# Patient Record
Sex: Female | Born: 1990 | Race: White | Hispanic: No | Marital: Married | Smoking: Former smoker
Health system: Southern US, Community
[De-identification: ages and names within clinical notes are randomized; demographics above are authoritative.]

## PROBLEM LIST (undated history)

## (undated) ENCOUNTER — Inpatient Hospital Stay: Payer: Self-pay

## (undated) ENCOUNTER — Emergency Department (HOSPITAL_COMMUNITY): Payer: Medicaid Other

## (undated) DIAGNOSIS — R51 Headache: Secondary | ICD-10-CM

## (undated) DIAGNOSIS — K219 Gastro-esophageal reflux disease without esophagitis: Secondary | ICD-10-CM

## (undated) DIAGNOSIS — K8050 Calculus of bile duct without cholangitis or cholecystitis without obstruction: Secondary | ICD-10-CM

## (undated) DIAGNOSIS — F329 Major depressive disorder, single episode, unspecified: Secondary | ICD-10-CM

## (undated) DIAGNOSIS — Z8751 Personal history of pre-term labor: Secondary | ICD-10-CM

## (undated) DIAGNOSIS — J45909 Unspecified asthma, uncomplicated: Secondary | ICD-10-CM

## (undated) DIAGNOSIS — F32A Depression, unspecified: Secondary | ICD-10-CM

## (undated) DIAGNOSIS — R519 Headache, unspecified: Secondary | ICD-10-CM

## (undated) DIAGNOSIS — F419 Anxiety disorder, unspecified: Secondary | ICD-10-CM

## (undated) HISTORY — DX: Calculus of bile duct without cholangitis or cholecystitis without obstruction: K80.50

## (undated) HISTORY — PX: NO PAST SURGERIES: SHX2092

## (undated) HISTORY — DX: Personal history of pre-term labor: Z87.51

---
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
## 2011-04-08 ENCOUNTER — Ambulatory Visit: Payer: Self-pay | Admitting: Family

## 2011-11-28 ENCOUNTER — Observation Stay: Payer: Self-pay

## 2011-12-01 ENCOUNTER — Inpatient Hospital Stay: Payer: Self-pay

## 2011-12-01 LAB — CBC WITH DIFFERENTIAL/PLATELET
Basophil #: 0 10*3/uL (ref 0.0–0.1)
Eosinophil #: 0 10*3/uL (ref 0.0–0.7)
Eosinophil %: 0.2 %
Lymphocyte #: 1.8 10*3/uL (ref 1.0–3.6)
MCH: 27.1 pg (ref 26.0–34.0)
MCHC: 33.1 g/dL (ref 32.0–36.0)
MCV: 82 fL (ref 80–100)
Neutrophil #: 5.9 10*3/uL (ref 1.4–6.5)
Platelet: 202 10*3/uL (ref 150–440)
RBC: 3.77 10*6/uL — ABNORMAL LOW (ref 3.80–5.20)
RDW: 15 % — ABNORMAL HIGH (ref 11.5–14.5)

## 2011-12-02 LAB — HEMATOCRIT: HCT: 29 % — ABNORMAL LOW (ref 35.0–47.0)

## 2013-08-24 ENCOUNTER — Observation Stay: Payer: Self-pay | Admitting: Obstetrics and Gynecology

## 2013-08-26 ENCOUNTER — Inpatient Hospital Stay: Payer: Self-pay | Admitting: Obstetrics and Gynecology

## 2013-08-26 LAB — CBC WITH DIFFERENTIAL/PLATELET
Basophil #: 0 10*3/uL (ref 0.0–0.1)
Basophil %: 0.3 %
Eosinophil #: 0 10*3/uL (ref 0.0–0.7)
HCT: 34 % — ABNORMAL LOW (ref 35.0–47.0)
Lymphocyte #: 1.7 10*3/uL (ref 1.0–3.6)
Lymphocyte %: 17.9 %
MCH: 27.7 pg (ref 26.0–34.0)
MCHC: 34.4 g/dL (ref 32.0–36.0)
MCV: 80 fL (ref 80–100)
Monocyte #: 0.6 x10 3/mm (ref 0.2–0.9)
Monocyte %: 6.7 %
Neutrophil %: 74.8 %
Platelet: 206 10*3/uL (ref 150–440)
RDW: 14.7 % — ABNORMAL HIGH (ref 11.5–14.5)

## 2013-08-26 LAB — GC/CHLAMYDIA PROBE AMP

## 2013-08-27 LAB — HEMATOCRIT: HCT: 27 % — ABNORMAL LOW (ref 35.0–47.0)

## 2014-07-27 ENCOUNTER — Emergency Department: Payer: Self-pay | Admitting: Emergency Medicine

## 2014-07-27 LAB — CBC
HCT: 35.3 % (ref 35.0–47.0)
HGB: 11.4 g/dL — ABNORMAL LOW (ref 12.0–16.0)
MCH: 27.4 pg (ref 26.0–34.0)
MCHC: 32.3 g/dL (ref 32.0–36.0)
MCV: 85 fL (ref 80–100)
PLATELETS: 273 10*3/uL (ref 150–440)
RBC: 4.15 10*6/uL (ref 3.80–5.20)
RDW: 13.9 % (ref 11.5–14.5)
WBC: 7.8 10*3/uL (ref 3.6–11.0)

## 2014-07-27 LAB — HCG, QUANTITATIVE, PREGNANCY: Beta Hcg, Quant.: 7876 m[IU]/mL — ABNORMAL HIGH

## 2014-10-14 NOTE — L&D Delivery Note (Signed)
Delivery Note At 11:41 AM a viable female was delivered via Vaginal, Spontaneous Delivery (Presentation LOA ;  ).  APGAR:8/9 , ; weight  .   Placenta status: Intact, Spontaneous.  Cord:3v  with the following complications: delayed clamping of cord. Loose nuchal cord reduced after delivery of the head. Anesthesia:   Episiotomy: None Lacerations:  none Suture Repair: n/a Est. Blood Loss (mL):  100  Mom to postpartum.  Baby to Couplet care / Skin to Skin.  Shavaughn Seidl 07/18/2015, 11:50 AM

## 2014-10-15 LAB — HM PAP SMEAR: HM Pap smear: NORMAL

## 2014-12-20 ENCOUNTER — Ambulatory Visit: Payer: Self-pay | Admitting: Family Medicine

## 2015-02-21 NOTE — H&P (Signed)
L&D Evaluation:  History:  HPI Pt is a 24 yo G4P2103 at 40.6 weeks with an ECD of 08/20/13. She presents today with reports of getting out of bed and having a gush of clear fluid come out around 7:15am. She has also been having some contractions after her water broke. She reports +FM. Her prenatal care is complicate by late entry to care and asthma. She is AB+, VI, RI, GBS+   Presents with leaking fluid   Patient's Medical History Asthma   Patient's Surgical History none   Medications Pre Natal Vitamins   Allergies NKDA   Social History none   Family History Non-Contributory   ROS:  ROS All systems were reviewed.  HEENT, CNS, GI, GU, Respiratory, CV, Renal and Musculoskeletal systems were found to be normal.   Exam:  Vital Signs stable   General no apparent distress   Mental Status clear   Chest clear   Heart normal sinus rhythm   Abdomen gravid, tender with contractions   Back no CVAT   Edema no edema   Pelvic other, 3/80/-2   Mebranes Ruptured, at 715am- clear   Description clear   FHT normal rate with no decels, 140's +accels   Ucx irregular   Skin dry, no lesions, no rashes   Lymph no lymphadenopathy   Impression:  Impression IUP at 40.6, labor, membranes ruptured   Plan:  Plan EFM/NST, monitor contractions and for cervical change, antibiotics for GBBS prophylaxis   Follow Up Appointment need to schedule   Electronic Signatures: Jannet MantisSubudhi, Yeslin Delio (CNM)  (Signed [id number]-Nov-14 11:28)  Authored: L&D Evaluation   Last Updated: 13-Nov-14 11:28 by Jannet MantisSubudhi, Teran Knittle (CNM)

## 2015-02-24 ENCOUNTER — Other Ambulatory Visit: Payer: Self-pay | Admitting: Family Medicine

## 2015-02-27 ENCOUNTER — Other Ambulatory Visit: Payer: Self-pay | Admitting: Family Medicine

## 2015-02-27 DIAGNOSIS — Z3490 Encounter for supervision of normal pregnancy, unspecified, unspecified trimester: Secondary | ICD-10-CM

## 2015-03-01 ENCOUNTER — Ambulatory Visit: Payer: Self-pay

## 2015-03-02 ENCOUNTER — Ambulatory Visit
Admission: RE | Admit: 2015-03-02 | Discharge: 2015-03-02 | Disposition: A | Discharge status: Home / Self Care | Payer: Medicaid Other | Source: Ambulatory Visit | Attending: Obstetrics and Gynecology | Admitting: Obstetrics and Gynecology

## 2015-03-02 VITALS — BP 127/74 | HR 106 | Temp 98.3°F | Ht 62.0 in | Wt 166.6 lb

## 2015-03-02 DIAGNOSIS — O09212 Supervision of pregnancy with history of pre-term labor, second trimester: Secondary | ICD-10-CM | POA: Diagnosis not present

## 2015-03-02 DIAGNOSIS — O09892 Supervision of other high risk pregnancies, second trimester: Secondary | ICD-10-CM

## 2015-03-02 HISTORY — DX: Major depressive disorder, single episode, unspecified: F32.9

## 2015-03-02 HISTORY — DX: Anxiety disorder, unspecified: F41.9

## 2015-03-02 HISTORY — DX: Depression, unspecified: F32.A

## 2015-03-02 NOTE — Progress Notes (Signed)
[facility] Consultation   Chief Complaint: History of 35 week delivery.  Is 17P indicated?  HPI: Ms. Hassell DoneKarly C Williams Shatz is a 24 y.o. [id number] at 5843w3d by 10 wk ultrasound who [facility]  for evaluation for 17P.  Past Medical History: Patient  has a past medical history of Depression and Anxiety. Not currently on medication.  Per records, asthma but not currently taking medication. Past Surgical History: She  has no past surgical history on file. Her 2 TABs were medical TABs. Obstetric History:  OB History    Gravida Para Term Preterm AB TAB SAB Ectopic Multiple Living   8 4 3 1 3 2 1   4      Gynecologic History:  No LMP recorded. Patient is pregnant.  Menses are irregular Hx of abnormal pap smears: no Last pap smear patient does not recall when last pap was   Medications: Tylenol PRN Allergies: Patient has No Known Allergies.  Social History: Patient  reports that she has quit smoking. She does not have any smokeless tobacco history on file. She reports that she does not drink alcohol or use illicit drugs. She is currently unemployed.  Her husband works as an Personnel officerelectrician and she presents today with her mother-in-law. Family History: unremarkable per patient Review of Systems A full 12 point review of systems was negative or as noted in the History of Present Illness.  Physical Exam: BP 127/74 mmHg  Pulse 106  Temp(Src) 98.3 F (36.8 C)  Ht 5\' 2"  (1.575 m)  Wt 166 lb 9.6 oz (75.569 kg)  BMI 30.46 kg/m2  SpO2 100%   Asessement: 24yo [id number] at 7143w3d by 10wk ultrasound.  History of 35 week delivery followed by 3 41 week deliveries (2 of which required induction of labor).    Plan: Ms. Darden AmberWilliams Creger feels like her first labor may have been precipitated by a UTI.  Her daughter did not require a stay in the hospital but was monitored for jaundice.  Her subsequent pregnancies were post dates.  We discussed  the recommendations and indication for 17P, which include any prior spontaneous preterm delivery, although I don't feel strongly that it is indicated in this situation.    I am more concerned with her mood and anxiety/depression.  She reports stopping her meds with the knowledge of her pregnancy although she doesn't know the name of them.  She denies SI/HI but does report moodiness which her mother-in-law endorses.  She was encouraged to reconsider mental health counseling.  Ms. Mayford KnifeWilliams will be return for an ultrasound for fetal anatomy on Monday.  Lastly, she reports conceiving her 2 most recent pregnancies on OCPs - we briefly discussed LARC methods of birth control which are likely to be more effective.  Total time spent with the patient was 30 minutes with greater than 50% spent in counseling and coordination of care. We appreciate this interesting consult and will be happy to be involved in the ongoing care of Ms. Darden AmberWilliams Eckmann in anyway her obstetricians desire.  Kirby FunkSarah Chanz Cahall, MD Maternal-Fetal Medicine [facility]

## 2015-03-03 ENCOUNTER — Ambulatory Visit: Payer: Self-pay

## 2015-03-06 ENCOUNTER — Ambulatory Visit
Admission: RE | Admit: 2015-03-06 | Discharge: 2015-03-06 | Disposition: A | Discharge status: Home / Self Care | Payer: Medicaid Other | Source: Ambulatory Visit | Attending: Maternal & Fetal Medicine | Admitting: Maternal & Fetal Medicine

## 2015-03-06 DIAGNOSIS — O35EXX0 Maternal care for other (suspected) fetal abnormality and damage, fetal genitourinary anomalies, not applicable or unspecified: Secondary | ICD-10-CM

## 2015-03-06 DIAGNOSIS — O359XX0 Maternal care for (suspected) fetal abnormality and damage, unspecified, not applicable or unspecified: Secondary | ICD-10-CM

## 2015-03-06 DIAGNOSIS — O358XX0 Maternal care for other (suspected) fetal abnormality and damage, not applicable or unspecified: Secondary | ICD-10-CM | POA: Diagnosis not present

## 2015-03-06 DIAGNOSIS — Z3A21 21 weeks gestation of pregnancy: Secondary | ICD-10-CM | POA: Diagnosis not present

## 2015-03-06 LAB — US OB DETAIL + 14 WK

## 2015-03-23 LAB — OB RESULTS CONSOLE GBS: GBS: NEGATIVE

## 2015-03-23 LAB — OB RESULTS CONSOLE VARICELLA ZOSTER ANTIBODY, IGG: VARICELLA IGG: IMMUNE

## 2015-03-23 LAB — OB RESULTS CONSOLE RUBELLA ANTIBODY, IGM: Rubella: IMMUNE

## 2015-03-23 LAB — OB RESULTS CONSOLE ABO/RH: RH Type: POSITIVE

## 2015-03-23 LAB — OB RESULTS CONSOLE HEPATITIS B SURFACE ANTIGEN: HEP B S AG: NEGATIVE

## 2015-03-23 LAB — OB RESULTS CONSOLE GC/CHLAMYDIA
Chlamydia: NEGATIVE
Gonorrhea: NEGATIVE

## 2015-03-23 LAB — OB RESULTS CONSOLE ANTIBODY SCREEN: Antibody Screen: NEGATIVE

## 2015-03-23 LAB — OB RESULTS CONSOLE RPR: RPR: NONREACTIVE

## 2015-03-23 LAB — OB RESULTS CONSOLE HIV ANTIBODY (ROUTINE TESTING): HIV: NONREACTIVE

## 2015-05-29 ENCOUNTER — Ambulatory Visit
Admission: RE | Admit: 2015-05-29 | Discharge: 2015-05-29 | Disposition: A | Discharge status: Home / Self Care | Payer: Medicaid Other | Source: Ambulatory Visit | Attending: Maternal & Fetal Medicine | Admitting: Maternal & Fetal Medicine

## 2015-05-29 DIAGNOSIS — O359XX0 Maternal care for (suspected) fetal abnormality and damage, unspecified, not applicable or unspecified: Secondary | ICD-10-CM | POA: Diagnosis not present

## 2015-05-29 DIAGNOSIS — Z3A33 33 weeks gestation of pregnancy: Secondary | ICD-10-CM | POA: Diagnosis not present

## 2015-05-29 DIAGNOSIS — N2889 Other specified disorders of kidney and ureter: Secondary | ICD-10-CM

## 2015-05-29 LAB — US MFM OB FOLLOW UP

## 2015-06-05 ENCOUNTER — Telehealth: Payer: Self-pay | Admitting: Obstetrics and Gynecology

## 2015-07-08 ENCOUNTER — Observation Stay
Admission: EM | Admit: 2015-07-08 | Discharge: 2015-07-09 | Disposition: A | Discharge status: Home / Self Care | Payer: Medicaid Other | Attending: Obstetrics and Gynecology | Admitting: Obstetrics and Gynecology

## 2015-07-08 DIAGNOSIS — Z3A38 38 weeks gestation of pregnancy: Secondary | ICD-10-CM | POA: Diagnosis not present

## 2015-07-08 DIAGNOSIS — O479 False labor, unspecified: Secondary | ICD-10-CM | POA: Diagnosis present

## 2015-07-08 NOTE — OB Triage Note (Signed)
Pt states she is having irregular contractions, some in her back, some in the front, no bleeding, no leaking fluid

## 2015-07-09 NOTE — OB Triage Provider Note (Signed)
History     CSN: [id number]  Arrival date and time: 07/08/15 2232   None    HPI  Lacey Bentley is a 24 year old Caucasian female [id number] at 38+[redacted]weeks gestation presenting to labor and delivery triage for a labor check. Reports she has been feeling "contractions and tightening at home that start in her back and move to her front."  She reports good fetal movement.  Denies vaginal bleeding, LOF, abnormal discharge, dysuria.  Reports good prenatal care at [facility] and has been followed by [facility] in [place] for fetal pyelectasis. Pt. Has 4 girls at home and this is a boy. She reports having her first baby at 35 weeks, but for the rest of her children she had to be induced.   OB History    Gravida Para Term Preterm AB TAB SAB Ectopic Multiple Living   Past Medical History  Diagnosis Date  . Depression   . Anxiety     Past Surgical History  Procedure Laterality Date  . No past surgeries      No family history on file.  Social History  Substance Use Topics  . Smoking status: Former Games developer  . Smokeless tobacco: Never Used  . Alcohol Use: No    Allergies: No Known Allergies  No prescriptions prior to admission    Review of Systems  Constitutional: Negative for fever, chills and weight loss.  HENT: Negative for congestion.   Eyes: Negative for blurred vision, double vision and photophobia.  Respiratory: Negative for cough, shortness of breath and wheezing.   Cardiovascular: Positive for leg swelling. Negative for chest pain and palpitations.       Non-pitting edema  Gastrointestinal: Negative for heartburn, nausea, vomiting and abdominal pain.       +intermittent mild contractions  Genitourinary: Negative for dysuria, urgency and frequency.  Musculoskeletal: Positive for back pain. Negative for neck pain.       Intermittent low back pain with contractions - but tolerable  Skin: Negative for itching and rash.   Neurological: Negative for dizziness, seizures and headaches.  Endo/Heme/Allergies: Negative for environmental allergies. Does not bruise/bleed easily.  Psychiatric/Behavioral: Negative for depression and suicidal ideas. The patient is not nervous/anxious.    Physical Exam   Blood pressure 120/77, pulse 80, temperature 98.3 F (36.8 C), last menstrual period 10/06/2014.  Physical Exam  Nursing note and vitals reviewed. Constitutional: She is oriented to person, place, and time. She appears well-developed and well-nourished.  HENT:  Head: Normocephalic.  Eyes: Pupils are equal, round, and reactive to light.  Neck: Normal range of motion.  Cardiovascular: Normal rate, regular rhythm and normal heart sounds.   Respiratory: Effort normal and breath sounds normal. No respiratory distress. She has no wheezes. She exhibits no tenderness.  GI: Soft. Bowel sounds are normal.  Genitourinary: Uterus normal.  Gravid; irregular mild contractions palpated  Musculoskeletal: Normal range of motion.  Neurological: She is alert and oriented to person, place, and time. She has normal reflexes.  Skin: Skin is warm and dry.  Dilation: 1.5 Effacement (%): 50 Station: Ballotable Presentation: Vertex Exam by:: s greene,rn  Cervical exam unchanged in 3 hours  Fetal Assessment: Baseline: 135bmp /moderate variability /+15x15 accels / no decels TOCO: contractions from 3-5minutes /sometimes closer intervals / lasting 40-60sec / mild   MAU Course  Procedures  NST  Assessment and Plan  IUP at 38+[redacted]weeks gestation  R/O Labor  Early Latent Labor Category 1 Fetal Tracing  Reviewed options risk/benefits with patient:  1. Walking the halls and staying in the hospital for continued evaluation to see if labor progresses and re-evaluating in the morning 2. Going home and resting, tub soaks and see if labor progresses  Pt. And FOB elect to go home with strict labor precautions - informed me that they only  live 10 minutes from the hospital and will come back if any changes Labor Precautions Reviewed Discharge to Home Return if worsening s/s, vaginal bleeding, LOF, decreased fetal movement, strong contractions 5-10 min apart FKC's daily If labor doesn't progress, keep regularly scheduled appointment at [facility] this week Neonatology/Peds should be involved at time of delivery for fetal pyelectasis   Dr. Dalbert Garnet aware and agrees with plan of care   Karena Addison CNM 07/09/2015, 1:53 AM

## 2015-07-09 NOTE — Plan of Care (Signed)
Discussed plan of care with pt. Would rather go home and get some rest instead of walking here x 1 hour. Meredith,cnm notified of pt's decision. Will be out to see her shortly

## 2015-07-09 NOTE — OB Triage Note (Signed)
Meredith ,cnm in to talk with pt.

## 2015-07-13 NOTE — Telephone Encounter (Signed)
Pt informed that per Dr. Lady Deutscher, her prenatal care should continue at [facility]. She was seen at [facility] by urology and follow up on the fetal kidneys is recommended after delivery.   Cherly Anderson, MS, CGC

## 2015-07-15 ENCOUNTER — Inpatient Hospital Stay
Admission: EM | Admit: 2015-07-15 | Discharge: 2015-07-15 | Disposition: A | Discharge status: Home / Self Care | Payer: Medicaid Other | Attending: Obstetrics and Gynecology | Admitting: Obstetrics and Gynecology

## 2015-07-15 DIAGNOSIS — Z3A40 40 weeks gestation of pregnancy: Secondary | ICD-10-CM | POA: Insufficient documentation

## 2015-07-15 NOTE — Progress Notes (Signed)
Pt given d/c inst and verbalized understanding.   Pt was then d/c home in stable condition with FOB

## 2015-07-15 NOTE — Progress Notes (Signed)
Report given to Dr. Beasley.   

## 2015-07-15 NOTE — OB Triage Provider Note (Signed)
TRIAGE VISIT with NST   Lacey Bentley is a 24 y.o. [id number]. She is at [redacted]w[redacted]d gestation, presenting with signs of labor.  Indication: Contractions  S: Resting comfortably. Irregular CTX, no VB. Active fetal movement.   O:  BP 120/78 mmHg  Pulse 92  Temp(Src) 98.4 F (36.9 C) (Oral)  Resp 18  Ht  (1.575 m)  Wt 79.833 kg (176 lb)  BMI 32.18 kg/m2  LMP 10/06/2014 No results found for this or any previous visit (from the past 48 hour(s)).   Gen: NAD, AAOx3      Abd: FNTTP      Ext: Non-tender, Nonedmeatous    FHT: 130, mod var, +accels, no decels TOCO: quiet JXB:JYNWGNFA: 1.5 Effacement (%): 50 Station: -3 Exam by:: CGD  NST: Reactive. See FHT above for particulars.  A/P:  24 y.o. O1H0865 [redacted]w[redacted]d with irregular contractions.   Labor: not present.   Fetal Wellbeing: Reassuring Cat 1 tracing.  D/c home stable, precautions reviewed, follow-up as scheduled.

## 2015-07-15 NOTE — OB Triage Note (Signed)
C/O Contractions since 1700pm.    Denies ROM, +FM   No VB.   Sexual inter. Yesterday.

## 2015-07-18 ENCOUNTER — Other Ambulatory Visit: Payer: Self-pay | Admitting: Obstetrics and Gynecology

## 2015-07-18 ENCOUNTER — Inpatient Hospital Stay
Admission: EM | Admit: 2015-07-18 | Discharge: 2015-07-19 | DRG: 775 | Disposition: A | Discharge status: Home / Self Care | Payer: Medicaid Other | Attending: Obstetrics and Gynecology | Admitting: Obstetrics and Gynecology

## 2015-07-18 ENCOUNTER — Inpatient Hospital Stay: Payer: Medicaid Other | Admitting: Anesthesiology

## 2015-07-18 DIAGNOSIS — Z87891 Personal history of nicotine dependence: Secondary | ICD-10-CM

## 2015-07-18 DIAGNOSIS — O4290 Premature rupture of membranes, unspecified as to length of time between rupture and onset of labor, unspecified weeks of gestation: Principal | ICD-10-CM | POA: Diagnosis present

## 2015-07-18 LAB — CBC
HCT: 31.4 % — ABNORMAL LOW (ref 35.0–47.0)
HEMOGLOBIN: 10.5 g/dL — AB (ref 12.0–16.0)
MCH: 26 pg (ref 26.0–34.0)
MCHC: 33.5 g/dL (ref 32.0–36.0)
MCV: 77.7 fL — ABNORMAL LOW (ref 80.0–100.0)
PLATELETS: 184 10*3/uL (ref 150–440)
RBC: 4.04 MIL/uL (ref 3.80–5.20)
RDW: 16.1 % — AB (ref 11.5–14.5)
WBC: 8.2 10*3/uL (ref 3.6–11.0)

## 2015-07-18 LAB — TYPE AND SCREEN
ABO/RH(D): AB POS
ANTIBODY SCREEN: NEGATIVE

## 2015-07-18 MED ORDER — NALOXONE HCL 1 MG/ML IJ SOLN
1.0000 ug/kg/h | INTRAVENOUS | Status: DC | PRN
  Filled 2015-07-18: qty 2

## 2015-07-18 MED ORDER — LIDOCAINE-EPINEPHRINE (PF) 1.5 %-1:200000 IJ SOLN
INTRAMUSCULAR | Status: DC | PRN
  Administered 2015-07-18: 3 mL via EPIDURAL

## 2015-07-18 MED ORDER — ONDANSETRON HCL 4 MG/2ML IJ SOLN: 4.0000 mg | Freq: Three times a day (TID) | INTRAMUSCULAR | Status: DC | PRN

## 2015-07-18 MED ORDER — DIPHENHYDRAMINE HCL 25 MG PO CAPS: 25.0000 mg | ORAL_CAPSULE | ORAL | Status: DC | PRN

## 2015-07-18 MED ORDER — ONDANSETRON HCL 4 MG/2ML IJ SOLN: 4.0000 mg | Freq: Four times a day (QID) | INTRAMUSCULAR | Status: DC | PRN

## 2015-07-18 MED ORDER — DIPHENHYDRAMINE HCL 50 MG/ML IJ SOLN: 12.5000 mg | INTRAMUSCULAR | Status: DC | PRN

## 2015-07-18 MED ORDER — WITCH HAZEL-GLYCERIN EX PADS: 1.0000 "application " | MEDICATED_PAD | CUTANEOUS | Status: DC | PRN

## 2015-07-18 MED ORDER — OXYCODONE-ACETAMINOPHEN 5-325 MG PO TABS: 2.0000 | ORAL_TABLET | ORAL | Status: DC | PRN

## 2015-07-18 MED ORDER — OXYTOCIN BOLUS FROM INFUSION
500.0000 mL | INTRAVENOUS | Status: DC
  Administered 2015-07-18: 500 mL via INTRAVENOUS

## 2015-07-18 MED ORDER — PRENATAL MULTIVITAMIN CH
1.0000 | ORAL_TABLET | Freq: Every day | ORAL | Status: DC
  Administered 2015-07-19: 1 via ORAL
  Filled 2015-07-18 (×2): qty 1

## 2015-07-18 MED ORDER — NALBUPHINE HCL 10 MG/ML IJ SOLN
5.0000 mg | INTRAMUSCULAR | Status: DC | PRN
  Filled 2015-07-18: qty 0.5

## 2015-07-18 MED ORDER — DIBUCAINE 1 % RE OINT: 1.0000 "application " | TOPICAL_OINTMENT | RECTAL | Status: DC | PRN

## 2015-07-18 MED ORDER — ACETAMINOPHEN 325 MG PO TABS
650.0000 mg | ORAL_TABLET | ORAL | Status: DC | PRN
  Administered 2015-07-19: 650 mg via ORAL
  Filled 2015-07-18: qty 2

## 2015-07-18 MED ORDER — ONDANSETRON HCL 4 MG PO TABS: 4.0000 mg | ORAL_TABLET | ORAL | Status: DC | PRN

## 2015-07-18 MED ORDER — ONDANSETRON HCL 4 MG/2ML IJ SOLN: 4.0000 mg | INTRAMUSCULAR | Status: DC | PRN

## 2015-07-18 MED ORDER — MISOPROSTOL 200 MCG PO TABS
ORAL_TABLET | ORAL | Status: AC
  Filled 2015-07-18: qty 4

## 2015-07-18 MED ORDER — ACETAMINOPHEN 325 MG PO TABS
650.0000 mg | ORAL_TABLET | ORAL | Status: DC | PRN
  Administered 2015-07-18: 650 mg via ORAL
  Filled 2015-07-18: qty 2

## 2015-07-18 MED ORDER — OXYTOCIN 40 UNITS IN LACTATED RINGERS INFUSION - SIMPLE MED
62.5000 mL/h | INTRAVENOUS | Status: DC
  Filled 2015-07-18: qty 1000

## 2015-07-18 MED ORDER — IBUPROFEN 600 MG PO TABS
600.0000 mg | ORAL_TABLET | Freq: Four times a day (QID) | ORAL | Status: DC
  Administered 2015-07-18 – 2015-07-19 (×4): 600 mg via ORAL
  Filled 2015-07-18 (×4): qty 1

## 2015-07-18 MED ORDER — LANOLIN HYDROUS EX OINT: TOPICAL_OINTMENT | CUTANEOUS | Status: DC | PRN

## 2015-07-18 MED ORDER — BENZOCAINE-MENTHOL 20-0.5 % EX AERO: 1.0000 "application " | INHALATION_SPRAY | CUTANEOUS | Status: DC | PRN

## 2015-07-18 MED ORDER — BUPIVACAINE HCL (PF) 0.25 % IJ SOLN
INTRAMUSCULAR | Status: DC | PRN
  Administered 2015-07-18 (×2): 4 mL via EPIDURAL

## 2015-07-18 MED ORDER — SENNOSIDES-DOCUSATE SODIUM 8.6-50 MG PO TABS: 2.0000 | ORAL_TABLET | ORAL | Status: DC

## 2015-07-18 MED ORDER — OXYTOCIN 40 UNITS IN LACTATED RINGERS INFUSION - SIMPLE MED: 62.5000 mL/h | INTRAVENOUS | Status: DC | PRN

## 2015-07-18 MED ORDER — ZOLPIDEM TARTRATE 5 MG PO TABS: 5.0000 mg | ORAL_TABLET | Freq: Every evening | ORAL | Status: DC | PRN

## 2015-07-18 MED ORDER — FERROUS SULFATE 325 (65 FE) MG PO TABS
325.0000 mg | ORAL_TABLET | Freq: Two times a day (BID) | ORAL | Status: DC
  Administered 2015-07-18 – 2015-07-19 (×2): 325 mg via ORAL
  Filled 2015-07-18 (×2): qty 1

## 2015-07-18 MED ORDER — BUTORPHANOL TARTRATE 1 MG/ML IJ SOLN
2.0000 mg | INTRAMUSCULAR | Status: DC | PRN
  Administered 2015-07-18: 2 mg via INTRAVENOUS
  Filled 2015-07-18: qty 2

## 2015-07-18 MED ORDER — MEASLES, MUMPS & RUBELLA VAC ~~LOC~~ INJ
0.5000 mL | INJECTION | Freq: Once | SUBCUTANEOUS | Status: DC
Start: 2015-07-19 — End: 2015-07-19
  Filled 2015-07-18: qty 0.5

## 2015-07-18 MED ORDER — LIDOCAINE HCL (PF) 1 % IJ SOLN
30.0000 mL | INTRAMUSCULAR | Status: DC | PRN
  Filled 2015-07-18: qty 30

## 2015-07-18 MED ORDER — CITRIC ACID-SODIUM CITRATE 334-500 MG/5ML PO SOLN: 30.0000 mL | ORAL | Status: DC | PRN

## 2015-07-18 MED ORDER — OXYCODONE-ACETAMINOPHEN 5-325 MG PO TABS: 1.0000 | ORAL_TABLET | ORAL | Status: DC | PRN

## 2015-07-18 MED ORDER — SCOPOLAMINE 1 MG/3DAYS TD PT72: 1.0000 | MEDICATED_PATCH | Freq: Once | TRANSDERMAL | Status: DC

## 2015-07-18 MED ORDER — MAGNESIUM HYDROXIDE 400 MG/5ML PO SUSP: 30.0000 mL | ORAL | Status: DC | PRN

## 2015-07-18 MED ORDER — SODIUM CHLORIDE 0.9 % IJ SOLN: 3.0000 mL | INTRAMUSCULAR | Status: DC | PRN

## 2015-07-18 MED ORDER — FLEET ENEMA 7-19 GM/118ML RE ENEM: 1.0000 | ENEMA | RECTAL | Status: DC | PRN

## 2015-07-18 MED ORDER — NALOXONE HCL 0.4 MG/ML IJ SOLN
0.4000 mg | INTRAMUSCULAR | Status: DC | PRN
Start: 2015-07-18 — End: 2015-07-19

## 2015-07-18 MED ORDER — LACTATED RINGERS IV SOLN
500.0000 mL | INTRAVENOUS | Status: DC | PRN
  Administered 2015-07-18: 500 mL via INTRAVENOUS

## 2015-07-18 MED ORDER — FENTANYL 2.5 MCG/ML W/ROPIVACAINE 0.2% IN NS 100 ML EPIDURAL INFUSION (ARMC-ANES): 10.0000 mL/h | EPIDURAL | Status: DC

## 2015-07-18 MED ORDER — NALBUPHINE HCL 10 MG/ML IJ SOLN
5.0000 mg | Freq: Once | INTRAMUSCULAR | Status: DC | PRN
  Filled 2015-07-18: qty 0.5

## 2015-07-18 MED ORDER — LACTATED RINGERS IV SOLN
INTRAVENOUS | Status: DC
  Administered 2015-07-18 (×2): via INTRAVENOUS

## 2015-07-18 MED ORDER — DIPHENHYDRAMINE HCL 25 MG PO CAPS: 25.0000 mg | ORAL_CAPSULE | Freq: Four times a day (QID) | ORAL | Status: DC | PRN

## 2015-07-18 MED ORDER — SIMETHICONE 80 MG PO CHEW: 80.0000 mg | CHEWABLE_TABLET | ORAL | Status: DC | PRN

## 2015-07-18 MED ORDER — MEPERIDINE HCL 25 MG/ML IJ SOLN: 6.2500 mg | INTRAMUSCULAR | Status: DC | PRN

## 2015-07-18 MED ORDER — FENTANYL 2.5 MCG/ML W/ROPIVACAINE 0.2% IN NS 100 ML EPIDURAL INFUSION (ARMC-ANES)
EPIDURAL | Status: AC
  Administered 2015-07-18: 10 mL/h via EPIDURAL
  Filled 2015-07-18: qty 100

## 2015-07-18 NOTE — Anesthesia Preprocedure Evaluation (Signed)
Anesthesia Evaluation  Patient identified by MRN, date of birth, ID band Patient awake    Reviewed: Allergy & Precautions, H&P , NPO status , Patient's Chart, lab work & pertinent test results  History of Anesthesia Complications Negative for: history of anesthetic complications  Airway Mallampati: II  TM Distance: >3 FB Neck ROM: full    Dental no notable dental hx.    Pulmonary former smoker,    Pulmonary exam normal        Cardiovascular negative cardio ROS Normal cardiovascular exam     Neuro/Psych PSYCHIATRIC DISORDERS Depression; Anxietynegative neurological ROS     GI/Hepatic negative GI ROS, Neg liver ROS,   Endo/Other  negative endocrine ROS  Renal/GU negative Renal ROS  negative genitourinary   Musculoskeletal   Abdominal   Peds  Hematology negative hematology ROS (+)   Anesthesia Other Findings   Reproductive/Obstetrics (+) Pregnancy                             Anesthesia Physical Anesthesia Plan  ASA: II  Anesthesia Plan: Epidural   Post-op Pain Management:    Induction:   Airway Management Planned:   Additional Equipment:   Intra-op Plan:   Post-operative Plan:   Informed Consent: I have reviewed the patients History and Physical, chart, labs and discussed the procedure including the risks, benefits and alternatives for the proposed anesthesia with the patient or authorized representative who has indicated his/her understanding and acceptance.     Plan Discussed with: Anesthesiologist  Anesthesia Plan Comments:         Anesthesia Quick Evaluation

## 2015-07-18 NOTE — Progress Notes (Signed)
RN to the bedside to assist pt. with pericare. Bed pan given, pt. Voided 200 ml (est);  clean pad with ice pack and panties applied Left leg weak (per patient). Pt. Transferred to PP Room #349 via WC. Mother-In-Law at the bedside.

## 2015-07-18 NOTE — H&P (Signed)
Lacey Bentley is a 24 y.o. female presenting for PROM at home with increasing contractions. Rupture for clear fluid at around 1am. Good fetal movement. Is a patient of [facility].  Maternal Medical History:  Reason for admission: Nausea.    OB History    Gravida Para Term Preterm AB TAB SAB Ectopic Multiple Living   Past Medical History  Diagnosis Date  . Depression   . Anxiety    Past Surgical History  Procedure Laterality Date  . No past surgeries     Family History: family history is not on file. Social History:  reports that she has quit smoking. She has never used smokeless tobacco. She reports that she does not drink alcohol or use illicit drugs.   Prenatal Transfer Tool    Review of Systems  Constitutional: Negative for fever and chills.  Eyes: Negative for blurred vision and double vision.  Respiratory: Negative for shortness of breath.   Cardiovascular: Negative for chest pain and palpitations.  Gastrointestinal: Negative for nausea, vomiting, abdominal pain, diarrhea and constipation.  Genitourinary: Negative for dysuria, urgency, frequency and flank pain.  Neurological: Negative for headaches.  Psychiatric/Behavioral: Negative for depression.    Dilation: 3 Effacement (%): 90 Exam by:: sca Blood pressure 113/63, pulse 75, temperature 98.1 F (36.7 C), temperature source Oral, resp. rate 18, height  (1.575 m), weight 79.833 kg (176 lb), last menstrual period 10/06/2014. Exam Physical Exam  Constitutional: She is oriented to person, place, and time. She appears well-developed and well-nourished. No distress.  Eyes: No scleral icterus.  Neck: Normal range of motion. Neck supple.  Cardiovascular: Normal rate.   Respiratory: Effort normal. No respiratory distress.  GI: Soft. She exhibits no distension. There is no tenderness.  Genitourinary: Vagina normal and uterus normal.  Musculoskeletal: Normal range of motion.   Neurological: She is alert and oriented to person, place, and time.  Skin: Skin is warm and dry.  Psychiatric: She has a normal mood and affect.    Prenatal labs: ABO, Rh: --/--/AB POS (10/04 0355) Antibody: NEG (10/04 0355) Rubella: Immune (06/09 0000) RPR: Nonreactive (06/09 0000)  HBsAg: Negative (06/09 0000)  HIV: Non-reactive (06/09 0000)  GBS: Negative (06/09 0000)   Assessment/Plan: 1. PROM in multiparous patient with increasing contractions. Will expectantly manage and augment if necessary. 2. GBS negative 3. Anticipate vaginal delivery 4. Epidural when desired.   Christeen Douglas 07/18/2015, 8:51 AM

## 2015-07-18 NOTE — Progress Notes (Addendum)
Patient ID: Lacey Bentley, female   DOB: 1991/06/01, 24 y.o.   MRN: [id number] Peds notified of fetal pyelectasis

## 2015-07-18 NOTE — Anesthesia Procedure Notes (Signed)
Epidural Patient location during procedure: OB Start time: 07/18/2015 9:07 AM End time: 07/18/2015 9:10 AM  Staffing Resident/CRNA: Stormy Fabian Performed by: resident/CRNA   Preanesthetic Checklist Completed: patient identified, site marked, surgical consent, pre-op evaluation, timeout performed, IV checked, risks and benefits discussed and monitors and equipment checked  Epidural Patient position: sitting Prep: Betadine Patient monitoring: heart rate, continuous pulse ox and blood pressure Approach: midline Location: L4-L5 Injection technique: LOR air  Needle:  Needle type: Tuohy  Needle gauge: 18 G Needle length: 9 cm and 9 Needle insertion depth: 5 and 5.5 cm Catheter type: closed end flexible Catheter size: 20 Guage Catheter at skin depth: 10 cm Test dose: negative and 1.5% lidocaine with Epi 1:200 K  Assessment Sensory level: T10 Events: blood not aspirated, injection not painful, no injection resistance, negative IV test and no paresthesia  Additional Notes Pt's history reviewed and consent obtained as per OB consent Patient tolerated the insertion well without complications. Negative SATD, negative IVTD All VSS were obtained and monitored through OBIX and nursing protocols followed.Reason for block:procedure for pain

## 2015-07-19 LAB — CBC
HCT: 26.9 % — ABNORMAL LOW (ref 35.0–47.0)
Hemoglobin: 8.8 g/dL — ABNORMAL LOW (ref 12.0–16.0)
MCH: 25.6 pg — AB (ref 26.0–34.0)
MCHC: 32.8 g/dL (ref 32.0–36.0)
MCV: 78.3 fL — ABNORMAL LOW (ref 80.0–100.0)
PLATELETS: 156 10*3/uL (ref 150–440)
RBC: 3.44 MIL/uL — ABNORMAL LOW (ref 3.80–5.20)
RDW: 16 % — AB (ref 11.5–14.5)
WBC: 10.6 10*3/uL (ref 3.6–11.0)

## 2015-07-19 MED ORDER — HYDROCODONE-ACETAMINOPHEN 5-325 MG PO TABS
1.0000 | ORAL_TABLET | ORAL | Status: DC | PRN
  Administered 2015-07-19: 2 via ORAL
  Filled 2015-07-19: qty 2

## 2015-07-19 MED ORDER — HYDROCODONE-ACETAMINOPHEN 5-325 MG PO TABS: 1.0000 | ORAL_TABLET | Freq: Four times a day (QID) | ORAL | Status: DC | PRN

## 2015-07-19 MED ORDER — IBUPROFEN 600 MG PO TABS: 600.0000 mg | ORAL_TABLET | Freq: Four times a day (QID) | ORAL | Status: DC

## 2015-07-19 NOTE — Anesthesia Postprocedure Evaluation (Signed)
  Anesthesia Post-op Note  Patient: Lacey Bentley  Procedure(s) Performed: * No procedures listed *  Anesthesia type:Epidural  Patient location: 349A  Post pain: Pain level controlled  Post assessment: Post-op Vital signs reviewed, Patient's Cardiovascular Status Stable, Respiratory Function Stable, Patent Airway and No signs of Nausea or vomiting  Post vital signs: Reviewed and stable  Last Vitals:  Filed Vitals:   07/19/15 0723  BP: 100/62  Pulse: 59  Temp: 36.6 C  Resp: 18    Level of consciousness: awake, alert  and patient cooperative  Complications: No apparent anesthesia complications

## 2015-07-19 NOTE — Discharge Summary (Signed)
Obstetric Discharge Summary Reason for Admission: onset of labor Prenatal Procedures: none Intrapartum Procedures: spontaneous vaginal delivery Postpartum Procedures: none Complications-Operative and Postpartum: none HEMOGLOBIN  Date Value Ref Range Status  07/19/2015 8.8* 12.0 - 16.0 g/dL Final   HGB  Date Value Ref Range Status  07/27/2014 11.4* 12.0-16.0 g/dL Final   HCT  Date Value Ref Range Status  07/19/2015 26.9* 35.0 - 47.0 % Final  07/27/2014 35.3 35.0-47.0 % Final    Physical Exam:  General: alert and cooperative Lochia: appropriate Uterine Fundus: firm Incision: n/a DVT Evaluation: No evidence of DVT seen on physical exam.  Discharge Diagnoses: Term Pregnancy-delivered  Discharge Information: Date: 07/19/2015 Activity: pelvic rest Diet: routine Medications: Ibuprofen and Vicodin Condition: stable Instructions: refer to practice specific booklet Discharge to: home Follow-up Information    Follow up with Jennell Corner, MD.   Specialty:  Obstetrics and Gynecology   Why:  postpartum care   Contact information:   [address] [place] [address] [phone]       Newborn Data: Live born female  Birth Weight: 8 lb 0.8 oz (3650 g) APGAR: 8, 9  Home with mother.  SCHERMERHORN,THOMAS 07/19/2015, 9:20 AM

## 2015-07-19 NOTE — Progress Notes (Signed)
D/c to home with newborn.  To car via auxillary in [place]

## 2016-10-14 NOTE — L&D Delivery Note (Signed)
Infant back to maternal abdomen for skin to skin contact. APGARS: 7, 7. Weight: 2820 grams or 6+4 pounds. Receiving nurse and NNP present at bedside for birth.   Spontaneous delivery of placenta at 1846. Cord pH: collected x 1. Large gush of blood and several fist sized clots noted after delivery of placenta. Uterus firm with rub. Perineum intact. Adequate epidural anesthesia EBL: 1000 ml. 800 mg cytotec placed rectally due to probable uterine abruption and multiparity. Moderate lochia noted. Vault check completed. Counts correct x 2.   Initiate routine postpartum care and orders. Will recollect CBC now and in am. Methergine 0.2 mg PO every 4 hour PRN, ordered for excessive bleeding. Mom to postpartum.  Baby to Couplet care / Skin to Skin.  FOB and family members present at bedside for birth of baby and post birth debriefing. All questions answered.    Gunnar BullaJenkins Michelle Connery Shiffler, CNM 08/09/2017, 8:07 PM

## 2016-10-14 NOTE — L&D Delivery Note (Signed)
       Delivery Note   Hassell DoneKarly C Williams Bentley is a 26 y.o. [id number] at 2580w0d Estimated Date of Delivery: 08/16/17  PRE-OPERATIVE DIAGNOSIS:  1) 3880w0d pregnancy.  2) Bradycardia with non-reassuring FHR tracing  POST-OPERATIVE DIAGNOSIS:  1) 180w0d pregnancy s/p Vaginal, Vacuum (Extractor)   2) Probable placental abruption  Delivery Type: Vaginal, Vacuum (Extractor)    Delivery Anesthesia: Epidural    ESTIMATED BLOOD LOSS: 800  ml    NARRATIVE SUMMARY: Called for fetal bradycardia that did not resolve with resuscitative measures.  FHR in 80s.   I presented and checked her cervix found her to be 8-9 cm and 100% effaced with a very soft floppy cervix.  Fetus did not respond to scalp stimulation.   I held the cervix out of the way anteriorly and encouraged her to push.  She pushed well.  She was able to push the baby down onto the perineum, but the anterior cervix returned each time I remove my fingers.   While pushing Lacey Bentley CNM held the cervix out of the way and I applied the vacuum to the fetal vertex.  Adequate room in the pelvis was noted.  Traction was applied over the next two contractions and the fetal vertex delivered over an intact perineum.  A gush of bloody amniotic fluid was noted at this time.  The shoulders and body were delivered without problem.   The cord was clamped and cut and the infant was place under heat lamps where it was resuscitated and began breathing and crying. The back to back nature of contractions, the fetal bradycardia and the bloody amniotic fluid make it likely that she had a placental abruption.    Lacey Bentley CNM will write the remainder of the completed delivery note.   Elonda Huskyavid J. Evans, M.D. 08/09/2017 7:20 PM

## 2016-12-13 ENCOUNTER — Ambulatory Visit (INDEPENDENT_AMBULATORY_CARE_PROVIDER_SITE_OTHER): Payer: Medicaid Other | Admitting: Certified Nurse Midwife

## 2016-12-13 ENCOUNTER — Encounter: Payer: Self-pay | Admitting: Certified Nurse Midwife

## 2016-12-13 VITALS — BP 113/79 | HR 88 | Ht 62.0 in | Wt 177.1 lb

## 2016-12-13 DIAGNOSIS — Z3200 Encounter for pregnancy test, result unknown: Secondary | ICD-10-CM

## 2016-12-13 DIAGNOSIS — Z3201 Encounter for pregnancy test, result positive: Secondary | ICD-10-CM | POA: Diagnosis not present

## 2016-12-13 LAB — POCT URINE PREGNANCY: Preg Test, Ur: POSITIVE — AB

## 2016-12-13 NOTE — Progress Notes (Signed)
GYN ENCOUNTER NOTE  Subjective:       Lacey Bentley is a 26 y.o. [phone] female is here for gynecologic evaluation of the following issues: positive home pregnancy test.   Lacey PattenKarly reports two (2) positive home pregnancy test on Tuesday, 12/10/2016. She endorses nausea, diarrhea/constipation, and nipple tenderness.   This pregnancy was unplanned, but not prevented. Her husband is scheduled to have a vasectomy. They have four (4) girls ages 11seven (7) , six (6), five (5), and three (3) years and one (1) son who is 3115 months old.   Denies difficulty breathing or respiratory distress, chest pain, abdominal pain, vaginal bleeding, and leg pain or swelling.    Lacey PattenKarly is a smoker.   Gynecologic History  Patient's last menstrual period was 11/09/2016 (within days). Late, less than normal. Last three (3) days.   Contraception: none   EDD: 08/16/2017  Gestational age: 23 weeks 6 days   Obstetric History OB History  Gravida Para Term Preterm AB Living  9 5 4 1 3 5   SAB TAB Ectopic Multiple Live Births  1 2   0 5    # Outcome Date GA Lbr Len/2nd Weight Sex Delivery Anes PTL Lv  9 Current           8 Term 07/18/15 2753w1d / 00:04 8 lb 0.8 oz (3.65 kg) M Vag-Spont   LIV  7 Term 07/26/13 6164w0d  7 lb 8 oz (3.402 kg) F    LIV  6 Term 12/01/11 6864w0d  7 lb 4 oz (3.289 kg) F    LIV  5 Term 05/03/10 4764w0d  7 lb 4 oz (3.289 kg) F Vag-Spont   LIV  4 Preterm 03/23/09 3772w0d  5 lb 9 oz (2.523 kg) F Vag-Spont   LIV  3 TAB           2 TAB           1 SAB               Past Medical History:  Diagnosis Date  . Anxiety   . Depression     Past Surgical History:  Procedure Laterality Date  . NO PAST SURGERIES      Current Outpatient Prescriptions on File Prior to Visit  Medication Sig Dispense Refill  . Prenatal Vit-Fe Fumarate-FA (PRENATAL MULTIVITAMIN) TABS tablet Take 1 tablet by mouth daily at 12 noon.     No current facility-administered medications on file prior to visit.     No  Known Allergies  Social History   Social History  . Marital status: Married    Spouse name: N/A  . Number of children: N/A  . Years of education: N/A   Occupational History  . Not on file.   Social History Main Topics  . Smoking status: Current Every Day Smoker    Packs/day: 1.00    Years: 2.00  . Smokeless tobacco: Never Used  . Alcohol use No  . Drug use: No  . Sexual activity: Yes    Birth control/ protection: None   Other Topics Concern  . Not on file   Social History Narrative  . No narrative on file    Family History  Problem Relation Age of Onset  . Hypertension Father     The following portions of the patient's history were reviewed and updated as appropriate: allergies, current medications, past family history, past medical history, past social history, past surgical history and problem list.  Review of Systems  Review of Systems - Negative except as noted above History obtained from the patient  Objective:   BP 113/79 (BP Location: Left Arm, Patient Position: Sitting, Cuff Size: Normal)   Pulse 88   Ht 5\' 2"  (1.575 m)   Wt 177 lb 1.6 oz (80.3 kg)   LMP 11/09/2016 (Within Days)   Breastfeeding? No   BMI 32.39 kg/m   Alert and oriented x 4  Positive UPT  Physical exam: not indicated  Assessment:   1. Possible pregnancy  - POCT urine pregnancy  2. Positive pregnancy test  Plan:   Encouraged prenatal vitamin with folic acid and DHA   Discussed smoking cessation and [id number] given  Reviewed red flag symptoms and when to call  RTC x 3-4 weeks for viability and dating [place]  RTC x 4-6 weeks for nurse intake   Lacey Bentley, CNM

## 2016-12-13 NOTE — Patient Instructions (Addendum)
Smoking During Pregnancy Smoking during pregnancy is unhealthy for you and your baby. Smoke from cigarettes, pipes, and cigars contains many chemicals that can cause cancer (carcinogens). Cigarettes also contain a stimulant drug (nicotine). When you smoke, harmful substances that you breathe in enter your bloodstream and can be passed on to your baby. This can affect your baby's development. If you are planning to become pregnant or have recently become pregnant, talk with your health care provider about quitting smoking. How does smoking affect me? Smoking increases your risk for many long-term (chronic) diseases. These diseases include cancer, lung diseases, and heart disease. Smoking during pregnancy increases your risk of:  Losing the pregnancy (miscarriage or stillbirth).  Giving birth too early (premature birth).  Pregnancy outside of the uterus (tubal pregnancy).  Having problems with the organ that provides the baby nourishment and oxygen (placenta), including:  Attachment of the placenta over the opening of the uterus (placenta previa).  Detachment of the placenta before the baby's birth (placental abruption).  Having your water break before labor begins (premature rupture of membranes). How does smoking affect my baby? Before Birth  Smoking during pregnancy:  Decreases blood flow and oxygen to your baby.  Increases your baby's risk of birth defects, such as heart defects.  Increases your baby's heart rate.  Slows your baby's growth in the uterus (intrauterine growth retardation). After Birth  Babies born to women who smoked during pregnancy may:  Have symptoms of nicotine withdrawal.  Need to stay in the hospital for special care.  May be too small at birth.  Have a high risk of:  Serious health problems or lifelong disabilities.  Sudden infant death syndrome (SIDS).  Becoming obese.  Developing behavior or learning problems. What can happen if changes are  not made? When babies are born with a birth defect or illness, they often need to stay in the hospital longer before going home. Hospital stays may also be longer if you had any complications during labor or delivery. Longer hospital stays and more treatments result in higher costs for health care. Many health issues among babies born to mothers who smoke can have a lifelong impact. This may include the long-term need for certain medicines, therapies, or other treatments. What are the benefits of not smoking during pregnancy? You have a much better chance of having a healthy pregnancy and a healthy baby if you do not smoke while you are pregnant. Not smoking also means that you will have a better chance of living a long and healthy life, and your baby will have a better chance of growing into a healthy child and adult. What actions can be taken? Quitting smoking can be difficult. Ask your health care provider for help to stop smoking. You may also consider:  Counseling to help you quit smoking (smoking cessation counseling).  Psychotherapy.  Acupuncture.  Hypnosis.  Telephone QUIT hotlines. If these methods do not help you, talk with your health care provider about other options. Do not take smoking cessation medicines or nicotine supplements unless your health care provider tells you to. Where to find more information: Learn more about smoking during pregnancy and quitting smoking from:  March of Dimes: www.marchofdimes.org/pregnancy/smoking-during-pregnancy.aspx  U.S. Department of Health and Human Services: women.smokefree.gov  American Cancer Society: www.cancer.org  American Heart Association: www.heart.org  National Cancer Institute: www.cancer.gov For help to quit smoking:  National smoking cessation telephone hotline: 1-800-QUIT NOW (784-8669) Contact a health care provider if:  You are struggling to quit smoking.    You are a smoker and you become pregnant or plan to  become pregnant.  You start smoking again after giving birth. Summary  Tobacco smoke contains harmful substances that can affect a baby's health and development.  Smoking increases the risk for serious problems, such as miscarriage, birth defects, or premature birth.  If you need help to quit smoking, ask your health care provider. This information is not intended to replace advice given to you by your health care provider. Make sure you discuss any questions you have with your health care provider. Document Released: 02/11/2005 Document Revised: 07/19/2016 Document Reviewed: 07/19/2016 Elsevier Interactive Patient Education  2017 [employer]. Minor Illnesses and Medications in Pregnancy  Cold/Flu:  Sudafed for congestion- Robitussin (plain) for cough- Tylenol for discomfort.  Please follow the directions on the label.  Try not to take any more than needed.  OTC Saline nasal spray and air humidifier or cool-mist  Vaporizer to sooth nasal irritation and to loosen congestion.  It is also important to increase intake of non carbonated fluids, especially if you have a fever.  Constipation:  Colace-2 capsules at bedtime; Metamucil- follow directions on label; Senokot- 1 tablet at bedtime.  Any one of these medications can be used.  It is also very important to increase fluids and fruits along with regular exercise.  If problem persists please call the office.  Diarrhea:  Kaopectate as directed on the label.  Eat a bland diet and increase fluids.  Avoid highly seasoned foods.  Headache:  Tylenol 1 or 2 tablets every 3-4 hours as needed  Indigestion:  Maalox, Mylanta, Tums or Rolaids- as directed on label.  Also try to eat small meals and avoid fatty, greasy or spicy foods.  Nausea with or without Vomiting:  Nausea in pregnancy is caused by increased levels of hormones in the body which influence the digestive system and cause irritation when stomach acids accumulate.  Symptoms usually subside  after 1st trimester of pregnancy.  Try the following: 1. Keep saltines, graham crackers or dry toast by your bed to eat upon awakening. 2. Don't let your stomach get empty.  Try to eat 5-6 small meals per day instead of 3 large ones. 3. Avoid greasy fatty or highly seasoned foods.  4. Take OTC Unisom 1 tablet at bed time along with OTC Vitamin B6 25-50 mg 3 times per day.    If nausea continues with vomiting and you are unable to keep down food and fluids you may need a prescription medication.  Please notify your provider.   Sore throat:  Chloraseptic spray, throat lozenges and or plain Tylenol.  Vaginal Yeast Infection:  OTC Monistat for 7 days as directed on label.  If symptoms do not resolve within a week notify provider.  If any of the above problems do not subside with recommended treatment please call the office for further assistance.   Do not take Aspirin, Advil, Motrin or Ibuprofen.  * * OTC= Over the counter

## 2017-01-13 ENCOUNTER — Other Ambulatory Visit: Payer: Medicaid Other

## 2017-01-15 ENCOUNTER — Other Ambulatory Visit: Payer: Self-pay | Admitting: Certified Nurse Midwife

## 2017-01-15 DIAGNOSIS — Z369 Encounter for antenatal screening, unspecified: Secondary | ICD-10-CM

## 2017-01-20 ENCOUNTER — Ambulatory Visit (INDEPENDENT_AMBULATORY_CARE_PROVIDER_SITE_OTHER): Payer: Medicaid Other

## 2017-01-20 DIAGNOSIS — Z369 Encounter for antenatal screening, unspecified: Secondary | ICD-10-CM

## 2017-01-20 DIAGNOSIS — Z3481 Encounter for supervision of other normal pregnancy, first trimester: Secondary | ICD-10-CM

## 2017-03-13 ENCOUNTER — Encounter: Payer: Self-pay | Admitting: Certified Nurse Midwife

## 2017-03-13 ENCOUNTER — Ambulatory Visit (INDEPENDENT_AMBULATORY_CARE_PROVIDER_SITE_OTHER): Payer: Medicaid Other | Admitting: Certified Nurse Midwife

## 2017-03-13 VITALS — BP 101/72 | HR 96 | Wt 175.2 lb

## 2017-03-13 DIAGNOSIS — O0930 Supervision of pregnancy with insufficient antenatal care, unspecified trimester: Secondary | ICD-10-CM

## 2017-03-13 DIAGNOSIS — O219 Vomiting of pregnancy, unspecified: Secondary | ICD-10-CM

## 2017-03-13 DIAGNOSIS — O09212 Supervision of pregnancy with history of pre-term labor, second trimester: Secondary | ICD-10-CM

## 2017-03-13 DIAGNOSIS — O09892 Supervision of other high risk pregnancies, second trimester: Secondary | ICD-10-CM

## 2017-03-13 DIAGNOSIS — Z1389 Encounter for screening for other disorder: Secondary | ICD-10-CM

## 2017-03-13 DIAGNOSIS — Z113 Encounter for screening for infections with a predominantly sexual mode of transmission: Secondary | ICD-10-CM

## 2017-03-13 DIAGNOSIS — K219 Gastro-esophageal reflux disease without esophagitis: Secondary | ICD-10-CM

## 2017-03-13 DIAGNOSIS — Z3482 Encounter for supervision of other normal pregnancy, second trimester: Secondary | ICD-10-CM | POA: Diagnosis not present

## 2017-03-13 DIAGNOSIS — O99612 Diseases of the digestive system complicating pregnancy, second trimester: Secondary | ICD-10-CM

## 2017-03-13 DIAGNOSIS — E669 Obesity, unspecified: Secondary | ICD-10-CM

## 2017-03-13 MED ORDER — ONDANSETRON 4 MG PO TBDP: 4.0000 mg | ORAL_TABLET | Freq: Four times a day (QID) | ORAL | 1 refills | Status: DC | PRN

## 2017-03-13 MED ORDER — RANITIDINE HCL 150 MG PO CAPS: 150.0000 mg | ORAL_CAPSULE | Freq: Two times a day (BID) | ORAL | 5 refills | Status: DC

## 2017-03-13 NOTE — Progress Notes (Signed)
NEW OB HISTORY AND PHYSICAL  SUBJECTIVE:       Lacey Bentley is a 26 y.o. [phone] female, Patient's last menstrual period was 11/09/2016 (within days)., Estimated Date of Delivery: 08/16/17, [redacted]w[redacted]d, presents today for establishment of Prenatal Care.  She has no unusual complaints and complains of nausea with occasional vomiting and diarrhea.    Denies difficulty breathing or respiratory distress, chest pain, dysuria, vaginal bleeding, and leg pain or swelling.   History significant for preterm birth at 35 weeks followed by followed by four (4) term births.    Gynecologic History  Patient's last menstrual period was 11/09/2016 (within days).   Last Pap: 10/15/2014. Results were: normal  Obstetric History  OB History  Gravida Para Term Preterm AB Living  9 5 4 1 3 5   SAB TAB Ectopic Multiple Live Births  1 2   0 5    # Outcome Date GA Lbr Len/2nd Weight Sex Delivery Anes PTL Lv  9 Current           8 Term 07/18/15 [redacted]w[redacted]d / 00:04 8 lb 0.8 oz (3.65 kg) M Vag-Spont   LIV  7 Term 07/26/13 [redacted]w[redacted]d  7 lb 8 oz (3.402 kg) F    LIV  6 Term 12/01/11 [redacted]w[redacted]d  7 lb 4 oz (3.289 kg) F    LIV  5 Term 05/03/10 [redacted]w[redacted]d  7 lb 4 oz (3.289 kg) F Vag-Spont   LIV  4 Preterm 03/23/09 [redacted]w[redacted]d  5 lb 9 oz (2.523 kg) F Vag-Spont   LIV  3 TAB           2 TAB           1 SAB               Past Medical History:  Diagnosis Date  . Anxiety   . Depression     Past Surgical History:  Procedure Laterality Date  . NO PAST SURGERIES      Current Outpatient Prescriptions on File Prior to Visit  Medication Sig Dispense Refill  . Prenatal Vit-Fe Fumarate-FA (PRENATAL MULTIVITAMIN) TABS tablet Take 1 tablet by mouth daily at 12 noon.     No current facility-administered medications on file prior to visit.     No Known Allergies  Social History   Social History  . Marital status: Married    Spouse name: N/A  . Number of children: N/A  . Years of education: N/A   Occupational History  . Not on  file.   Social History Main Topics  . Smoking status: Current Every Day Smoker    Packs/day: 1.00    Years: 2.00  . Smokeless tobacco: Never Used  . Alcohol use No  . Drug use: No  . Sexual activity: Yes    Birth control/ protection: None   Other Topics Concern  . Not on file   Social History Narrative  . No narrative on file    Family History  Problem Relation Age of Onset  . Hypertension Father     The following portions of the patient's history were reviewed and updated as appropriate: allergies, current medications, past OB history, past medical history, past surgical history, past family history, past social history, and problem list.  Review of systems  Review of systems negative except as noted above. Information obtained from patient.   OBJECTIVE:  Initial Physical Exam (New OB)  GENERAL APPEARANCE: alert, well appearing, in no apparent distress  HEAD: normocephalic, atraumatic  MOUTH: mucous  membranes moist, pharynx normal without lesions  THYROID: no thyromegaly or masses present  BREASTS: no masses noted, no significant tenderness, no palpable axillary nodes, no skin changes  LUNGS: clear to auscultation, no wheezes, rales or rhonchi, symmetric air entry  HEART: regular rate and rhythm, no murmurs  ABDOMEN: soft, nontender, nondistended, no abnormal masses, no epigastric pain, fundus soft, nontender 18 weeks size and FHT present  EXTREMITIES: no redness or tenderness in the calves or thighs, no edema  SKIN: normal coloration and turgor, no rashes  LYMPH NODES: no adenopathy palpable  NEUROLOGIC: alert, oriented, normal speech, no focal findings or movement disorder noted  PELVIC EXAM deferred  ASSESSMENT: Normal pregnancy Late entry prenatal care History of preterm delivery, currently pregnant  BMI >30 Nausea and vomiting in pregnancy GERD in pregnancy  PLAN: Prenatal care. New OB labs today. Declines early glucola, 17P, and cervical  length ultrasounds. Rx Zantac and Zofran, see orders. Reviewed pregnancy safe foods and medications, anticipated weight gain, and course of prenatal care.  Discussed red flag symptoms and when to call.  RTC x 3 weeks for anatomy scan and ROB See orders   Gunnar BullaJenkins Michelle Armari Fussell, CNM

## 2017-03-13 NOTE — Patient Instructions (Signed)
Round Ligament Pain The round ligament is a cord of muscle and tissue that helps to support the uterus. It can become a source of pain during pregnancy if it becomes stretched or twisted as the baby grows. The pain usually begins in the second trimester of pregnancy, and it can come and go until the baby is delivered. It is not a serious problem, and it does not cause harm to the baby. Round ligament pain is usually a short, sharp, and pinching pain, but it can also be a dull, lingering, and aching pain. The pain is felt in the lower side of the abdomen or in the groin. It usually starts deep in the groin and moves up to the outside of the hip area. Pain can occur with:  A sudden change in position.  Rolling over in bed.  Coughing or sneezing.  Physical activity.  Follow these instructions at home: Watch your condition for any changes. Take these steps to help with your pain:  When the pain starts, relax. Then try: ? Sitting down. ? Flexing your knees up to your abdomen. ? Lying on your side with one pillow under your abdomen and another pillow between your legs. ? Sitting in a warm bath for 15-20 minutes or until the pain goes away.  Take over-the-counter and prescription medicines only as told by your health care provider.  Move slowly when you sit and stand.  Avoid long walks if they cause pain.  Stop or lessen your physical activities if they cause pain.  Contact a health care provider if:  Your pain does not go away with treatment.  You feel pain in your back that you did not have before.  Your medicine is not helping. Get help right away if:  You develop a fever or chills.  You develop uterine contractions.  You develop vaginal bleeding.  You develop nausea or vomiting.  You develop diarrhea.  You have pain when you urinate. This information is not intended to replace advice given to you by your health care provider. Make sure you discuss any questions you have  with your health care provider. Document Released: 07/09/2008 Document Revised: 03/07/2016 Document Reviewed: 12/07/2014 Elsevier Interactive Patient Education  2018 [employer] Sciatica Sciatica is pain, numbness, weakness, or tingling along the path of the sciatic nerve. The sciatic nerve starts in the lower back and runs down the back of each leg. The nerve controls the muscles in the lower leg and in the back of the knee. It also provides feeling (sensation) to the back of the thigh, the lower leg, and the sole of the foot. Sciatica is a symptom of another medical condition that pinches or puts pressure on the sciatic nerve. Generally, sciatica only affects one side of the body. Sciatica usually goes away on its own or with treatment. In some cases, sciatica may keep coming back (recur). What are the causes? This condition is caused by pressure on the sciatic nerve, or pinching of the sciatic nerve. This may be the result of:  A disk in between the bones of the spine (vertebrae) bulging out too far (herniated disk).  Age-related changes in the spinal disks (degenerative disk disease).  A pain disorder that affects a muscle in the buttock (piriformis syndrome).  Extra bone growth (bone spur) near the sciatic nerve.  An injury or break (fracture) of the pelvis.  Pregnancy.  Tumor (rare).  What increases the risk? The following factors may make you more likely to develop  this condition:  Playing sports that place pressure or stress on the spine, such as football or weight lifting.  Having poor strength and flexibility.  A history of back injury.  A history of back surgery.  Sitting for long periods of time.  Doing activities that involve repetitive bending or lifting.  Obesity.  What are the signs or symptoms? Symptoms can vary from mild to very severe, and they may include:  Any of these problems in the lower back, leg, hip, or buttock: ? Mild tingling or dull  aches. ? Burning sensations. ? Sharp pains.  Numbness in the back of the calf or the sole of the foot.  Leg weakness.  Severe back pain that makes movement difficult.  These symptoms may get worse when you cough, sneeze, or laugh, or when you sit or stand for long periods of time. Being overweight may also make symptoms worse. In some cases, symptoms may recur over time. How is this diagnosed? This condition may be diagnosed based on:  Your symptoms.  A physical exam. Your health care provider may ask you to do certain movements to check whether those movements trigger your symptoms.  You may have tests, including: ? Blood tests. ? X-rays. ? MRI. ? CT scan.  How is this treated? In many cases, this condition improves on its own, without any treatment. However, treatment may include:  Reducing or modifying physical activity during periods of pain.  Exercising and stretching to strengthen your abdomen and improve the flexibility of your spine.  Icing and applying heat to the affected area.  Medicines that help: ? To relieve pain and swelling. ? To relax your muscles.  Injections of medicines that help to relieve pain, irritation, and inflammation around the sciatic nerve (steroids).  Surgery.  Follow these instructions at home: Medicines  Take over-the-counter and prescription medicines only as told by your health care provider.  Do not drive or operate heavy machinery while taking prescription pain medicine. Managing pain  If directed, apply ice to the affected area. ? Put ice in a plastic bag. ? Place a towel between your skin and the bag. ? Leave the ice on for 20 minutes, 2-3 times a day.  After icing, apply heat to the affected area before you exercise or as often as told by your health care provider. Use the heat source that your health care provider recommends, such as a moist heat pack or a heating pad. ? Place a towel between your skin and the heat  source. ? Leave the heat on for 20-30 minutes. ? Remove the heat if your skin turns bright red. This is especially important if you are unable to feel pain, heat, or cold. You may have a greater risk of getting burned. Activity  Return to your normal activities as told by your health care provider. Ask your health care provider what activities are safe for you. ? Avoid activities that make your symptoms worse.  Take brief periods of rest throughout the day. Resting in a lying or standing position is usually better than sitting to rest. ? When you rest for longer periods, mix in some mild activity or stretching between periods of rest. This will help to prevent stiffness and pain. ? Avoid sitting for long periods of time without moving. Get up and move around at least one time each hour.  Exercise and stretch regularly, as told by your health care provider.  Do not lift anything that is heavier than 10 lb (  4.5 kg) while you have symptoms of sciatica. When you do not have symptoms, you should still avoid heavy lifting, especially repetitive heavy lifting.  When you lift objects, always use proper lifting technique, which includes: ? Bending your knees. ? Keeping the load close to your body. ? Avoiding twisting. General instructions  Use good posture. ? Avoid leaning forward while sitting. ? Avoid hunching over while standing.  Maintain a healthy weight. Excess weight puts extra stress on your back and makes it difficult to maintain good posture.  Wear supportive, comfortable shoes. Avoid wearing high heels.  Avoid sleeping on a mattress that is too soft or too hard. A mattress that is firm enough to support your back when you sleep may help to reduce your pain.  Keep all follow-up visits as told by your health care provider. This is important. Contact a health care provider if:  You have pain that wakes you up when you are sleeping.  You have pain that gets worse when you lie  down.  Your pain is worse than you have experienced in the past.  Your pain lasts longer than 4 weeks.  You experience unexplained weight loss. Get help right away if:  You lose control of your bowel or bladder (incontinence).  You have: ? Weakness in your lower back, pelvis, buttocks, or legs that gets worse. ? Redness or swelling of your back. ? A burning sensation when you urinate. This information is not intended to replace advice given to you by your health care provider. Make sure you discuss any questions you have with your health care provider. Document Released: 09/24/2001 Document Revised: 03/05/2016 Document Reviewed: 06/09/2015 Elsevier Interactive Patient Education  2017 [employer]. Second Trimester of Pregnancy The second trimester is from week 14 through week 27 (months 4 through 6). The second trimester is often a time when you feel your best. Your body has adjusted to being pregnant, and you begin to feel better physically. Usually, morning sickness has lessened or quit completely, you may have more energy, and you may have an increase in appetite. The second trimester is also a time when the fetus is growing rapidly. At the end of the sixth month, the fetus is about 9 inches long and weighs about 1 pounds. You will likely begin to feel the baby move (quickening) between 16 and 20 weeks of pregnancy. Body changes during your second trimester Your body continues to go through many changes during your second trimester. The changes vary from woman to woman.  Your weight will continue to increase. You will notice your lower abdomen bulging out.  You may begin to get stretch marks on your hips, abdomen, and breasts.  You may develop headaches that can be relieved by medicines. The medicines should be approved by your health care provider.  You may urinate more often because the fetus is pressing on your bladder.  You may develop or continue to have heartburn as a result  of your pregnancy.  You may develop constipation because certain hormones are causing the muscles that push waste through your intestines to slow down.  You may develop hemorrhoids or swollen, bulging veins (varicose veins).  You may have back pain. This is caused by: ? Weight gain. ? Pregnancy hormones that are relaxing the joints in your pelvis. ? A shift in weight and the muscles that support your balance.  Your breasts will continue to grow and they will continue to become tender.  Your gums may bleed and  may be sensitive to brushing and flossing.  Dark spots or blotches (chloasma, mask of pregnancy) may develop on your face. This will likely fade after the baby is born.  A dark line from your belly button to the pubic area (linea nigra) may appear. This will likely fade after the baby is born.  You may have changes in your hair. These can include thickening of your hair, rapid growth, and changes in texture. Some women also have hair loss during or after pregnancy, or hair that feels dry or thin. Your hair will most likely return to normal after your baby is born.  What to expect at prenatal visits During a routine prenatal visit:  You will be weighed to make sure you and the fetus are growing normally.  Your blood pressure will be taken.  Your abdomen will be measured to track your baby's growth.  The fetal heartbeat will be listened to.  Any test results from the previous visit will be discussed.  Your health care provider may ask you:  How you are feeling.  If you are feeling the baby move.  If you have had any abnormal symptoms, such as leaking fluid, bleeding, severe headaches, or abdominal cramping.  If you are using any tobacco products, including cigarettes, chewing tobacco, and electronic cigarettes.  If you have any questions.  Other tests that may be performed during your second trimester include:  Blood tests that check for: ? Low iron levels  (anemia). ? High blood sugar that affects pregnant women (gestational diabetes) between 74 and 28 weeks. ? Rh antibodies. This is to check for a protein on red blood cells (Rh factor).  Urine tests to check for infections, diabetes, or protein in the urine.  An ultrasound to confirm the proper growth and development of the baby.  An amniocentesis to check for possible genetic problems.  Fetal screens for spina bifida and Down syndrome.  HIV (human immunodeficiency virus) testing. Routine prenatal testing includes screening for HIV, unless you choose not to have this test.  Follow these instructions at home: Medicines  Follow your health care provider's instructions regarding medicine use. Specific medicines may be either safe or unsafe to take during pregnancy.  Take a prenatal vitamin that contains at least 600 micrograms (mcg) of folic acid.  If you develop constipation, try taking a stool softener if your health care provider approves. Eating and drinking  Eat a balanced diet that includes fresh fruits and vegetables, whole grains, good sources of protein such as meat, eggs, or tofu, and low-fat dairy. Your health care provider will help you determine the amount of weight gain that is right for you.  Avoid raw meat and uncooked cheese. These carry germs that can cause birth defects in the baby.  If you have low calcium intake from food, talk to your health care provider about whether you should take a daily calcium supplement.  Limit foods that are high in fat and processed sugars, such as fried and sweet foods.  To prevent constipation: ? Drink enough fluid to keep your urine clear or pale yellow. ? Eat foods that are high in fiber, such as fresh fruits and vegetables, whole grains, and beans. Activity  Exercise only as directed by your health care provider. Most women can continue their usual exercise routine during pregnancy. Try to exercise for 30 minutes at least 5 days a  week. Stop exercising if you experience uterine contractions.  Avoid heavy lifting, wear low heel shoes, and  practice good posture.  A sexual relationship may be continued unless your health care provider directs you otherwise. Relieving pain and discomfort  Wear a good support bra to prevent discomfort from breast tenderness.  Take warm sitz baths to soothe any pain or discomfort caused by hemorrhoids. Use hemorrhoid cream if your health care provider approves.  Rest with your legs elevated if you have leg cramps or low back pain.  If you develop varicose veins, wear support hose. Elevate your feet for 15 minutes, 3-4 times a day. Limit salt in your diet. Prenatal Care  Write down your questions. Take them to your prenatal visits.  Keep all your prenatal visits as told by your health care provider. This is important. Safety  Wear your seat belt at all times when driving.  Make a list of emergency phone numbers, including numbers for family, friends, the hospital, and police and fire departments. General instructions  Ask your health care provider for a referral to a local prenatal education class. Begin classes no later than the beginning of month 6 of your pregnancy.  Ask for help if you have counseling or nutritional needs during pregnancy. Your health care provider can offer advice or refer you to specialists for help with various needs.  Do not use hot tubs, steam rooms, or saunas.  Do not douche or use tampons or scented sanitary pads.  Do not cross your legs for long periods of time.  Avoid cat litter boxes and soil used by cats. These carry germs that can cause birth defects in the baby and possibly loss of the fetus by miscarriage or stillbirth.  Avoid all smoking, herbs, alcohol, and unprescribed drugs. Chemicals in these products can affect the formation and growth of the baby.  Do not use any products that contain nicotine or tobacco, such as cigarettes and  e-cigarettes. If you need help quitting, ask your health care provider.  Visit your dentist if you have not gone yet during your pregnancy. Use a soft toothbrush to brush your teeth and be gentle when you floss. Contact a health care provider if:  You have dizziness.  You have mild pelvic cramps, pelvic pressure, or nagging pain in the abdominal area.  You have persistent nausea, vomiting, or diarrhea.  You have a bad smelling vaginal discharge.  You have pain when you urinate. Get help right away if:  You have a fever.  You are leaking fluid from your vagina.  You have spotting or bleeding from your vagina.  You have severe abdominal cramping or pain.  You have rapid weight gain or weight loss.  You have shortness of breath with chest pain.  You notice sudden or extreme swelling of your face, hands, ankles, feet, or legs.  You have not felt your baby move in over an hour.  You have severe headaches that do not go away when you take medicine.  You have vision changes. Summary  The second trimester is from week 14 through week 27 (months 4 through 6). It is also a time when the fetus is growing rapidly.  Your body goes through many changes during pregnancy. The changes vary from woman to woman.  Avoid all smoking, herbs, alcohol, and unprescribed drugs. These chemicals affect the formation and growth your baby.  Do not use any tobacco products, such as cigarettes, chewing tobacco, and e-cigarettes. If you need help quitting, ask your health care provider.  Contact your health care provider if you have any questions. Keep all  prenatal visits as told by your health care provider. This is important. This information is not intended to replace advice given to you by your health care provider. Make sure you discuss any questions you have with your health care provider. Document Released: 09/24/2001 Document Revised: 03/07/2016 Document Reviewed: 12/01/2012 Elsevier  Interactive Patient Education  2017 [employer]. Eating Plan for Pregnant Women While you are pregnant, your body will require additional nutrition to help support your growing baby. It is recommended that you consume:  150 additional calories each day during your first trimester.  300 additional calories each day during your second trimester.  300 additional calories each day during your third trimester.  Eating a healthy, well-balanced diet is very important for your health and for your baby's health. You also have a higher need for some vitamins and minerals, such as folic acid, calcium, iron, and vitamin D. What do I need to know about eating during pregnancy?  Do not try to lose weight or go on a diet during pregnancy.  Choose healthy, nutritious foods. Choose  of a sandwich with a glass of milk instead of a candy bar or a high-calorie sugar-sweetened beverage.  Limit your overall intake of foods that have "empty calories." These are foods that have little nutritional value, such as sweets, desserts, candies, sugar-sweetened beverages, and fried foods.  Eat a variety of foods, especially fruits and vegetables.  Take a prenatal vitamin to help meet the additional needs during pregnancy, specifically for folic acid, iron, calcium, and vitamin D.  Remember to stay active. Ask your health care provider for exercise recommendations that are specific to you.  Practice good food safety and cleanliness, such as washing your hands before you eat and after you prepare raw meat. This helps to prevent foodborne illnesses, such as listeriosis, that can be very dangerous for your baby. Ask your health care provider for more information about listeriosis. What does 150 extra calories look like? Healthy options for an additional 150 calories each day could be any of the following:  Plain low-fat yogurt (6-8 oz) with  cup of berries.  1 apple with 2 teaspoons of peanut butter.  Cut-up  vegetables with  cup of hummus.  Low-fat chocolate milk (8 oz or 1 cup).  1 string cheese with 1 medium orange.   of a peanut butter and jelly sandwich on whole-wheat bread (1 tsp of peanut butter).  For 300 calories, you could eat two of those healthy options each day. What is a healthy amount of weight to gain? The recommended amount of weight for you to gain is based on your pre-pregnancy BMI. If your pre-pregnancy BMI was:  Less than 18 (underweight), you should gain 28-40 lb.  18-24.9 (normal), you should gain 25-35 lb.  25-29.9 (overweight), you should gain 15-25 lb.  Greater than 30 (obese), you should gain 11-20 lb.  What if I am having twins or multiples? Generally, pregnant women who will be having twins or multiples may need to increase their daily calories by 300-600 calories each day. The recommended range for total weight gain is 25-54 lb, depending on your pre-pregnancy BMI. Talk with your health care provider for specific guidance about additional nutritional needs, weight gain, and exercise during your pregnancy. What foods can I eat? Grains Any grains. Try to choose whole grains, such as whole-wheat bread, oatmeal, or brown rice. Vegetables Any vegetables. Try to eat a variety of colors and types of vegetables to get a full range of  vitamins and minerals. Remember to wash your vegetables well before eating. Fruits Any fruits. Try to eat a variety of colors and types of fruit to get a full range of vitamins and minerals. Remember to wash your fruits well before eating. Meats and Other Protein Sources Lean meats, including chicken, [place], fish, and lean cuts of beef, veal, or pork. Make sure that all meats are cooked to "well done." Tofu. Tempeh. Beans. Eggs. Peanut butter and other nut butters. Seafood, such as shrimp, crab, and lobster. If you choose fish, select types that are higher in omega-3 fatty acids, including salmon, herring, mussels, trout, sardines, and  pollock. Make sure that all meats are cooked to food-safe temperatures. Dairy Pasteurized milk and milk alternatives. Pasteurized yogurt and pasteurized cheese. Cottage cheese. Sour cream. Beverages Water. Juices that contain 100% fruit juice or vegetable juice. Caffeine-free teas and decaffeinated coffee. Drinks that contain caffeine are okay to drink, but it is better to avoid caffeine. Keep your total caffeine intake to less than 200 mg each day (12 oz of coffee, tea, or soda) or as directed by your health care provider. Condiments Any pasteurized condiments. Sweets and Desserts Any sweets and desserts. Fats and Oils Any fats and oils. The items listed above may not be a complete list of recommended foods or beverages. Contact your dietitian for more options. What foods are not recommended? Vegetables Unpasteurized (raw) vegetable juices. Fruits Unpasteurized (raw) fruit juices. Meats and Other Protein Sources Cured meats that have nitrates, such as bacon, salami, and hotdogs. Luncheon meats, bologna, or other deli meats (unless they are reheated until they are steaming hot). Refrigerated pate, meat spreads from a meat counter, smoked seafood that is found in the refrigerated section of a store. Raw fish, such as sushi or sashimi. High mercury content fish, such as tilefish, shark, swordfish, and king mackerel. Raw meats, such as tuna or beef tartare. Undercooked meats and poultry. Make sure that all meats are cooked to food-safe temperatures. Dairy Unpasteurized (raw) milk and any foods that have raw milk in them. Soft cheeses, such as feta, queso blanco, queso fresco, Brie, Camembert cheeses, blue-veined cheeses, and Panela cheese (unless it is made with pasteurized milk, which must be stated on the label). Beverages Alcohol. Sugar-sweetened beverages, such as sodas, teas, or energy drinks. Condiments Homemade fermented foods and drinks, such as pickles, sauerkraut, or kombucha drinks.  (Store-bought pasteurized versions of these are okay.) Other Salads that are made in the store, such as ham salad, chicken salad, egg salad, tuna salad, and seafood salad. The items listed above may not be a complete list of foods and beverages to avoid. Contact your dietitian for more information. This information is not intended to replace advice given to you by your health care provider. Make sure you discuss any questions you have with your health care provider. Document Released: 07/15/2014 Document Revised: 03/07/2016 Document Reviewed: 03/15/2014 Elsevier Interactive Patient Education  [employer]2018 [employer] Inc[employer]

## 2017-03-14 LAB — MONITOR DRUG PROFILE 14(MW)
Amphetamine Scrn, Ur: NEGATIVE ng/mL
BARBITURATE SCREEN URINE: NEGATIVE ng/mL
BENZODIAZEPINE SCREEN, URINE: NEGATIVE ng/mL
Buprenorphine, Urine: NEGATIVE ng/mL
CANNABINOIDS UR QL SCN: NEGATIVE ng/mL
COCAINE(METAB.)SCREEN, URINE: NEGATIVE ng/mL
CREATININE(CRT), U: 46.4 mg/dL (ref 20.0–300.0)
Fentanyl, Urine: NEGATIVE pg/mL
METHADONE SCREEN, URINE: NEGATIVE ng/mL
Meperidine Screen, Urine: NEGATIVE ng/mL
OPIATE SCREEN URINE: NEGATIVE ng/mL
OXYCODONE+OXYMORPHONE UR QL SCN: NEGATIVE ng/mL
Ph of Urine: 6.3 (ref 4.5–8.9)
Phencyclidine Qn, Ur: NEGATIVE ng/mL
Propoxyphene Scrn, Ur: NEGATIVE ng/mL
SPECIFIC GRAVITY: 1.008
TRAMADOL SCREEN, URINE: NEGATIVE ng/mL

## 2017-03-14 LAB — RH TYPE: Rh Factor: POSITIVE

## 2017-03-14 LAB — CBC WITH DIFFERENTIAL/PLATELET
BASOS: 0 %
Basophils Absolute: 0 10*3/uL (ref 0.0–0.2)
EOS (ABSOLUTE): 0.1 10*3/uL (ref 0.0–0.4)
EOS: 1 %
Hematocrit: 36 % (ref 34.0–46.6)
Hemoglobin: 12.1 g/dL (ref 11.1–15.9)
IMMATURE GRANULOCYTES: 1 %
Immature Grans (Abs): 0.1 10*3/uL (ref 0.0–0.1)
LYMPHS: 15 %
Lymphocytes Absolute: 1.8 10*3/uL (ref 0.7–3.1)
MCH: 30.6 pg (ref 26.6–33.0)
MCHC: 33.6 g/dL (ref 31.5–35.7)
MCV: 91 fL (ref 79–97)
Monocytes Absolute: 0.7 10*3/uL (ref 0.1–0.9)
Monocytes: 6 %
NEUTROS ABS: 9.5 10*3/uL — AB (ref 1.4–7.0)
NEUTROS PCT: 77 %
PLATELETS: 220 10*3/uL (ref 150–379)
RBC: 3.96 x10E6/uL (ref 3.77–5.28)
RDW: 13.7 % (ref 12.3–15.4)
WBC: 12.2 10*3/uL — ABNORMAL HIGH (ref 3.4–10.8)

## 2017-03-14 LAB — URINALYSIS, ROUTINE W REFLEX MICROSCOPIC
BILIRUBIN UA: NEGATIVE
GLUCOSE, UA: NEGATIVE
KETONES UA: NEGATIVE
Leukocytes, UA: NEGATIVE
Nitrite, UA: NEGATIVE
PROTEIN UA: NEGATIVE
RBC UA: NEGATIVE
SPEC GRAV UA: 1.007 (ref 1.005–1.030)
Urobilinogen, Ur: 0.2 mg/dL (ref 0.2–1.0)
pH, UA: 6.5 (ref 5.0–7.5)

## 2017-03-14 LAB — HEMOGLOBIN A1C
ESTIMATED AVERAGE GLUCOSE: 97 mg/dL
HEMOGLOBIN A1C: 5 % (ref 4.8–5.6)

## 2017-03-14 LAB — RPR: RPR: NONREACTIVE

## 2017-03-14 LAB — HIV ANTIBODY (ROUTINE TESTING W REFLEX): HIV Screen 4th Generation wRfx: NONREACTIVE

## 2017-03-14 LAB — NICOTINE SCREEN, URINE: Cotinine Ql Scrn, Ur: POSITIVE ng/mL

## 2017-03-14 LAB — ANTIBODY SCREEN: Antibody Screen: NEGATIVE

## 2017-03-14 LAB — GC/CHLAMYDIA PROBE AMP
Chlamydia trachomatis, NAA: NEGATIVE
Neisseria gonorrhoeae by PCR: NEGATIVE

## 2017-03-14 LAB — RUBELLA SCREEN: RUBELLA: 2.61 {index} (ref >0.99)

## 2017-03-14 LAB — HEPATITIS B SURFACE ANTIGEN: HEP B S AG: NEGATIVE

## 2017-03-14 LAB — ABO

## 2017-03-14 LAB — VARICELLA ZOSTER ANTIBODY, IGG: VARICELLA: 1130 {index} (ref >165)

## 2017-03-14 LAB — TSH: TSH: 1.25 u[IU]/mL (ref 0.450–4.500)

## 2017-03-15 DIAGNOSIS — O09892 Supervision of other high risk pregnancies, second trimester: Secondary | ICD-10-CM

## 2017-03-15 DIAGNOSIS — O09212 Supervision of pregnancy with history of pre-term labor, second trimester: Secondary | ICD-10-CM

## 2017-03-15 DIAGNOSIS — O0930 Supervision of pregnancy with insufficient antenatal care, unspecified trimester: Secondary | ICD-10-CM | POA: Insufficient documentation

## 2017-03-15 DIAGNOSIS — O0990 Supervision of high risk pregnancy, unspecified, unspecified trimester: Secondary | ICD-10-CM | POA: Insufficient documentation

## 2017-03-15 DIAGNOSIS — O219 Vomiting of pregnancy, unspecified: Secondary | ICD-10-CM | POA: Insufficient documentation

## 2017-03-15 HISTORY — DX: Supervision of other high risk pregnancies, second trimester: O09.892

## 2017-03-15 LAB — URINE CULTURE, OB REFLEX

## 2017-03-15 LAB — CULTURE, OB URINE

## 2017-04-03 ENCOUNTER — Ambulatory Visit (INDEPENDENT_AMBULATORY_CARE_PROVIDER_SITE_OTHER): Payer: Medicaid Other

## 2017-04-03 ENCOUNTER — Telehealth: Payer: Self-pay

## 2017-04-03 ENCOUNTER — Ambulatory Visit (INDEPENDENT_AMBULATORY_CARE_PROVIDER_SITE_OTHER): Payer: Medicaid Other | Admitting: Certified Nurse Midwife

## 2017-04-03 DIAGNOSIS — Z3482 Encounter for supervision of other normal pregnancy, second trimester: Secondary | ICD-10-CM

## 2017-04-03 NOTE — Telephone Encounter (Signed)
Called pt no answer, LM for pt informing her that she left the office after her u/s without being seen, advised pt to call back for an appt.

## 2017-04-03 NOTE — Progress Notes (Signed)
Pt completed ultrasound then left without being seen.   ULTRASOUND REPORT  Location: [facility] Date of Service: 04/03/17  Indications:Anatomy U/S Findings:  Mason JimSingleton intrauterine pregnancy is visualized with FHR at 169 BPM. Biometrics give an (U/S) Gestational age of 25 2/7 weeks and an (U/S) EDD of 08/19/17; this correlates with the clinically established EDD of 08/16/17.  Fetal presentation is Vertex.  EFW: 345g (12 oz). Placenta: Anterior, grade0, 3.6 cm from internal os,central cord insert.. AFI: Adequate with MVP of 4.9 cm.  Anatomic survey is complete and normal; Gender - female  .   Ovaries are not seen. Survey of the adnexa demonstrates no adnexal masses. There is no free peritoneal fluid in the cul de sac.  Impression: 1. 20 2/7 week Viable Singleton Intrauterine pregnancy by U/S. 2. (U/S) EDD is consistent with Clinically established (LMP) EDD of 08/16/17. 3. Normal Anatomy Scan  Recommendations: 1.Clinical correlation with the patient's History and Physical Exam.

## 2017-04-10 ENCOUNTER — Telehealth: Payer: Self-pay

## 2017-04-10 ENCOUNTER — Encounter: Payer: Medicaid Other | Admitting: Certified Nurse Midwife

## 2017-04-10 NOTE — Telephone Encounter (Signed)
Message left for pt to please call and make an appointment for an OB visit. Pt was reminded of 2 missed appointments and the importance of prenatal care.

## 2017-06-25 ENCOUNTER — Encounter: Payer: Self-pay | Admitting: Certified Nurse Midwife

## 2017-06-25 ENCOUNTER — Ambulatory Visit (INDEPENDENT_AMBULATORY_CARE_PROVIDER_SITE_OTHER): Payer: Medicaid Other | Admitting: Certified Nurse Midwife

## 2017-06-25 VITALS — BP 98/68 | HR 97 | Wt 174.8 lb

## 2017-06-25 DIAGNOSIS — Z3483 Encounter for supervision of other normal pregnancy, third trimester: Secondary | ICD-10-CM

## 2017-06-25 LAB — POCT URINALYSIS DIPSTICK
BILIRUBIN UA: NEGATIVE
Clarity, UA: NEGATIVE
GLUCOSE UA: NEGATIVE
Ketones, UA: NEGATIVE
NITRITE UA: NEGATIVE
PH UA: 7 (ref 5.0–8.0)
Protein, UA: NEGATIVE
RBC UA: NEGATIVE
SPEC GRAV UA: 1.02 (ref 1.010–1.025)
UROBILINOGEN UA: 0.2 U/dL

## 2017-06-25 NOTE — Progress Notes (Signed)
Pt declines TDAP vaccine. Pt states she is sure she got it in January 2018 at [facility].

## 2017-06-25 NOTE — Patient Instructions (Signed)

## 2017-06-25 NOTE — Progress Notes (Signed)
ROB, doing well. Discussed pt. precautions given pt history of preterm delivery. She endorses good fetal movement and states that she has some braxton hicks contractions. Discussed remainder of prenatal care and the GBS swab at 36 wks. She agrees to plan . She states that she feel and skinned her knee the other day but did not fall on her belly . Only fell on her knee. She will follow up in 2 wks.   Doreene BurkeAnnie Somalia Segler, CNM

## 2017-06-27 ENCOUNTER — Encounter: Payer: Self-pay | Admitting: Certified Nurse Midwife

## 2017-07-09 ENCOUNTER — Ambulatory Visit (INDEPENDENT_AMBULATORY_CARE_PROVIDER_SITE_OTHER): Payer: Medicaid Other | Admitting: Certified Nurse Midwife

## 2017-07-09 ENCOUNTER — Encounter: Payer: Self-pay | Admitting: Certified Nurse Midwife

## 2017-07-09 VITALS — BP 96/67 | HR 90 | Wt 170.8 lb

## 2017-07-09 DIAGNOSIS — Z3483 Encounter for supervision of other normal pregnancy, third trimester: Secondary | ICD-10-CM

## 2017-07-09 LAB — POCT URINALYSIS DIPSTICK
Bilirubin, UA: NEGATIVE
Blood, UA: NEGATIVE
GLUCOSE UA: NEGATIVE
Ketones, UA: NEGATIVE
LEUKOCYTES UA: NEGATIVE
NITRITE UA: NEGATIVE
PROTEIN UA: NEGATIVE
SPEC GRAV UA: 1.01 (ref 1.010–1.025)
UROBILINOGEN UA: 0.2 U/dL
pH, UA: 7 (ref 5.0–8.0)

## 2017-07-09 NOTE — Patient Instructions (Signed)
Group B Streptococcus Infection During Pregnancy Group B Streptococcus (GBS) is a type of bacteria (Streptococcus agalactiae) that is often found in healthy people, commonly in the rectum, vagina, and intestines. In people who are healthy and not pregnant, the bacteria rarely cause serious illness or complications. However, women who test positive for GBS during pregnancy can pass the bacteria to their baby during childbirth, which can cause serious infection in the baby after birth. Women with GBS may also have infections during their pregnancy or immediately after childbirth, such as such as urinary tract infections (UTIs) or infections of the uterus (uterine infections). Having GBS also increases a woman's risk of complications during pregnancy, such as early (preterm) labor or delivery, miscarriage, or stillbirth. Routine testing (screening) for GBS is recommended for all pregnant women. What increases the risk? You may have a higher risk for GBS infection during pregnancy if you had one during a past pregnancy. What are the signs or symptoms? In most cases, GBS infection does not cause symptoms in pregnant women. Signs and symptoms of a possible GBS-related infection may include:  Labor starting before the 37th week of pregnancy.  A UTI or bladder infection, which may cause: ? Fever. ? Pain or burning during urination. ? Frequent urination.  Fever during labor, along with: ? Bad-smelling discharge. ? Uterine tenderness. ? Rapid heartbeat in the mother, baby, or both.  Rare but serious symptoms of a possible GBS-related infection in women include:  Blood infection (septicemia). This may cause fever, chills, or confusion.  Lung infection (pneumonia). This may cause fever, chills, cough, rapid breathing, difficulty breathing, or chest pain.  Bone, joint, skin, or soft tissue infection.  How is this diagnosed? You may be screened for GBS between week 35 and week 37 of your pregnancy. If  you have symptoms of preterm labor, you may be screened earlier. This condition is diagnosed based on lab test results from:  A swab of fluid from the vagina and rectum.  A urine sample.  How is this treated? This condition is treated with antibiotic medicine. When you go into labor, or as soon as your water breaks (your membranes rupture), you will be given antibiotics through an IV tube. Antibiotics will continue until after you give birth. If you are having a cesarean delivery, you do not need antibiotics unless your membranes have already ruptured. Follow these instructions at home:  Take over-the-counter and prescription medicines only as told by your health care provider.  Take your antibiotic medicine as told by your health care provider. Do not stop taking the antibiotic even if you start to feel better.  Keep all pre-birth (prenatal) visits and follow-up visits as told by your health care provider. This is important. Contact a health care provider if:  You have pain or burning when you urinate.  You have to urinate frequently.  You have a fever or chills.  You develop a bad-smelling vaginal discharge. Get help right away if:  Your membranes rupture.  You go into labor.  You have severe pain in your abdomen.  You have difficulty breathing.  You have chest pain. This information is not intended to replace advice given to you by your health care provider. Make sure you discuss any questions you have with your health care provider. Document Released: 01/07/2008 Document Revised: 04/26/2016 Document Reviewed: 04/25/2016 Elsevier Interactive Patient Education  2018 [employer] Inc[employer]  

## 2017-07-09 NOTE — Progress Notes (Signed)
ROB doing well. Endorses good fetal movement. Complains of pressure. Discussed cord blood donation, GBS at next visit , and preterm labor precautions. BTC form today.  Follow up 2 wks.   Doreene Burke, CNM

## 2017-07-14 ENCOUNTER — Telehealth: Payer: Self-pay | Admitting: Certified Nurse Midwife

## 2017-07-14 NOTE — Telephone Encounter (Signed)
Pt stated that for the last 3 nights she has been having hard contracts every 3 to 4 minutes lasting 51 to 54 seconds and during the day they ease off but she is still having them once or twice every hour. Pt request a call back to speak with a nurse. I offered pt an appt with Marcelino Duster today but pt stated that she had no way to get to the office today. Please advise. Thanks TNP

## 2017-07-14 NOTE — Telephone Encounter (Signed)
lmtrc

## 2017-07-15 NOTE — Telephone Encounter (Signed)
Pt states her ctx have eased up today. She is nauseous. She tried to eat soup but vomited right after. Pos hydration- 2 bottles of h20 oJ, and mountain dew. NO fever. Pos fm. NO vb or lof. At times diarrhea or constipation. Advised BRAT diet. Push fluids. If sx get worse to contact office.

## 2017-07-23 ENCOUNTER — Ambulatory Visit (INDEPENDENT_AMBULATORY_CARE_PROVIDER_SITE_OTHER): Payer: Medicaid Other | Admitting: Certified Nurse Midwife

## 2017-07-23 ENCOUNTER — Encounter: Payer: Self-pay | Admitting: Certified Nurse Midwife

## 2017-07-23 VITALS — BP 104/70 | HR 108 | Wt 170.5 lb

## 2017-07-23 DIAGNOSIS — Z3483 Encounter for supervision of other normal pregnancy, third trimester: Secondary | ICD-10-CM

## 2017-07-23 DIAGNOSIS — Z113 Encounter for screening for infections with a predominantly sexual mode of transmission: Secondary | ICD-10-CM

## 2017-07-23 DIAGNOSIS — Z369 Encounter for antenatal screening, unspecified: Secondary | ICD-10-CM

## 2017-07-23 LAB — POCT URINALYSIS DIPSTICK
Bilirubin, UA: NEGATIVE
Glucose, UA: NEGATIVE
KETONES UA: NEGATIVE
LEUKOCYTES UA: NEGATIVE
NITRITE UA: NEGATIVE
PH UA: 6 (ref 5.0–8.0)
PROTEIN UA: NEGATIVE
RBC UA: NEGATIVE
Spec Grav, UA: 1.01 (ref 1.010–1.025)
Urobilinogen, UA: 0.2 E.U./dL

## 2017-07-23 NOTE — Patient Instructions (Signed)

## 2017-07-23 NOTE — Progress Notes (Signed)
ROB, doing well. No complaints. GBS and cultures today. Labor precautions reviewed. Follow up 1 wk.  Doreene Burke, CNM

## 2017-07-25 ENCOUNTER — Encounter: Payer: Self-pay | Admitting: Certified Nurse Midwife

## 2017-07-25 LAB — GC/CHLAMYDIA PROBE AMP
Chlamydia trachomatis, NAA: NEGATIVE
NEISSERIA GONORRHOEAE BY PCR: NEGATIVE

## 2017-07-25 LAB — STREP GP B NAA: Strep Gp B NAA: NEGATIVE

## 2017-07-29 ENCOUNTER — Ambulatory Visit (INDEPENDENT_AMBULATORY_CARE_PROVIDER_SITE_OTHER): Payer: Medicaid Other | Admitting: Certified Nurse Midwife

## 2017-07-29 VITALS — BP 92/65 | HR 72 | Wt 168.0 lb

## 2017-07-29 DIAGNOSIS — Z3493 Encounter for supervision of normal pregnancy, unspecified, third trimester: Secondary | ICD-10-CM

## 2017-07-29 LAB — POCT URINALYSIS DIPSTICK
Bilirubin, UA: NEGATIVE
Glucose, UA: NEGATIVE
KETONES UA: NEGATIVE
Leukocytes, UA: NEGATIVE
Nitrite, UA: NEGATIVE
PH UA: 7 (ref 5.0–8.0)
PROTEIN UA: NEGATIVE
RBC UA: NEGATIVE
Spec Grav, UA: 1.01 (ref 1.010–1.025)
Urobilinogen, UA: 0.2 E.U./dL

## 2017-07-29 NOTE — Patient Instructions (Signed)
Vaginal Delivery Vaginal delivery means that you will give birth by pushing your baby out of your birth canal (vagina). A team of health care providers will help you before, during, and after vaginal delivery. Birth experiences are unique for every woman and every pregnancy, and birth experiences vary depending on where you choose to give birth. What should I do to prepare for my baby's birth? Before your baby is born, it is important to talk with your health care provider about:  Your labor and delivery preferences. These may include: ? Medicines that you may be given. ? How you will manage your pain. This might include non-medical pain relief techniques or injectable pain relief such as epidural analgesia. ? How you and your baby will be monitored during labor and delivery. ? Who may be in the labor and delivery room with you. ? Your feelings about surgical delivery of your baby (cesarean delivery, or C-section) if this becomes necessary. ? Your feelings about receiving donated blood through an IV tube (blood transfusion) if this becomes necessary.  Whether you are able: ? To take pictures or videos of the birth. ? To eat during labor and delivery. ? To move around, walk, or change positions during labor and delivery.  What to expect after your baby is born, such as: ? Whether delayed umbilical cord clamping and cutting is offered. ? Who will care for your baby right after birth. ? Medicines or tests that may be recommended for your baby. ? Whether breastfeeding is supported in your hospital or birth center. ? How long you will be in the hospital or birth center.  How any medical conditions you have may affect your baby or your labor and delivery experience.  To prepare for your baby's birth, you should also:  Attend all of your health care visits before delivery (prenatal visits) as recommended by your health care provider. This is important.  Prepare your home for your baby's  arrival. Make sure that you have: ? Diapers. ? Baby clothing. ? Feeding equipment. ? Safe sleeping arrangements for you and your baby.  Install a car seat in your vehicle. Have your car seat checked by a certified car seat installer to make sure that it is installed safely.  Think about who will help you with your new baby at home for at least the first several weeks after delivery.  What can I expect when I arrive at the birth center or hospital? Once you are in labor and have been admitted into the hospital or birth center, your health care provider may:  Review your pregnancy history and any concerns you have.  Insert an IV tube into one of your veins. This is used to give you fluids and medicines.  Check your blood pressure, pulse, temperature, and heart rate (vital signs).  Check whether your bag of water (amniotic sac) has broken (ruptured).  Talk with you about your birth plan and discuss pain control options.  Monitoring Your health care provider may monitor your contractions (uterine monitoring) and your baby's heart rate (fetal monitoring). You may need to be monitored:  Often, but not continuously (intermittently).  All the time or for long periods at a time (continuously). Continuous monitoring may be needed if: ? You are taking certain medicines, such as medicine to relieve pain or make your contractions stronger. ? You have pregnancy or labor complications.  Monitoring may be done by:  Placing a special stethoscope or a handheld monitoring device on your abdomen to   check your baby's heartbeat, and feeling your abdomen for contractions. This method of monitoring does not continuously record your baby's heartbeat or your contractions.  Placing monitors on your abdomen (external monitors) to record your baby's heartbeat and the frequency and length of contractions. You may not have to wear external monitors all the time.  Placing monitors inside of your uterus  (internal monitors) to record your baby's heartbeat and the frequency, length, and strength of your contractions. ? Your health care provider may use internal monitors if he or she needs more information about the strength of your contractions or your baby's heart rate. ? Internal monitors are put in place by passing a thin, flexible wire through your vagina and into your uterus. Depending on the type of monitor, it may remain in your uterus or on your baby's head until birth. ? Your health care provider will discuss the benefits and risks of internal monitoring with you and will ask for your permission before inserting the monitors.  Telemetry. This is a type of continuous monitoring that can be done with external or internal monitors. Instead of having to stay in bed, you are able to move around during telemetry. Ask your health care provider if telemetry is an option for you.  Physical exam Your health care provider may perform a physical exam. This may include:  Checking whether your baby is positioned: ? With the head toward your vagina (head-down). This is most common. ? With the head toward the top of your uterus (head-up or breech). If your baby is in a breech position, your health care provider may try to turn your baby to a head-down position so you can deliver vaginally. If it does not seem that your baby can be born vaginally, your provider may recommend surgery to deliver your baby. In rare cases, you may be able to deliver vaginally if your baby is head-up (breech delivery). ? Lying sideways (transverse). Babies that are lying sideways cannot be delivered vaginally.  Checking your cervix to determine: ? Whether it is thinning out (effacing). ? Whether it is opening up (dilating). ? How low your baby has moved into your birth canal.  What are the three stages of labor and delivery?  Normal labor and delivery is divided into the following three stages: Stage 1  Stage 1 is the  longest stage of labor, and it can last for hours or days. Stage 1 includes: ? Early labor. This is when contractions may be irregular, or regular and mild. Generally, early labor contractions are more than 10 minutes apart. ? Active labor. This is when contractions get longer, more regular, more frequent, and more intense. ? The transition phase. This is when contractions happen very close together, are very intense, and may last longer than during any other part of labor.  Contractions generally feel mild, infrequent, and irregular at first. They get stronger, more frequent (about every 2-3 minutes), and more regular as you progress from early labor through active labor and transition.  Many women progress through stage 1 naturally, but you may need help to continue making progress. If this happens, your health care provider may talk with you about: ? Rupturing your amniotic sac if it has not ruptured yet. ? Giving you medicine to help make your contractions stronger and more frequent.  Stage 1 ends when your cervix is completely dilated to 4 inches (10 cm) and completely effaced. This happens at the end of the transition phase. Stage 2  Once   your cervix is completely effaced and dilated to 4 inches (10 cm), you may start to feel an urge to push. It is common for the body to naturally take a rest before feeling the urge to push, especially if you received an epidural or certain other pain medicines. This rest period may last for up to 1-2 hours, depending on your unique labor experience.  During stage 2, contractions are generally less painful, because pushing helps relieve contraction pain. Instead of contraction pain, you may feel stretching and burning pain, especially when the widest part of your baby's head passes through the vaginal opening (crowning).  Your health care provider will closely monitor your pushing progress and your baby's progress through the vagina during stage 2.  Your  health care provider may massage the area of skin between your vaginal opening and anus (perineum) or apply warm compresses to your perineum. This helps it stretch as the baby's head starts to crown, which can help prevent perineal tearing. ? In some cases, an incision may be made in your perineum (episiotomy) to allow the baby to pass through the vaginal opening. An episiotomy helps to make the opening of the vagina larger to allow more room for the baby to fit through.  It is very important to breathe and focus so your health care provider can control the delivery of your baby's head. Your health care provider may have you decrease the intensity of your pushing, to help prevent perineal tearing.  After delivery of your baby's head, the shoulders and the rest of the body generally deliver very quickly and without difficulty.  Once your baby is delivered, the umbilical cord may be cut right away, or this may be delayed for 1-2 minutes, depending on your baby's health. This may vary among health care providers, hospitals, and birth centers.  If you and your baby are healthy enough, your baby may be placed on your chest or abdomen to help maintain the baby's temperature and to help you bond with each other. Some mothers and babies start breastfeeding at this time. Your health care team will dry your baby and help keep your baby warm during this time.  Your baby may need immediate care if he or she: ? Showed signs of distress during labor. ? Has a medical condition. ? Was born too early (prematurely). ? Had a bowel movement before birth (meconium). ? Shows signs of difficulty transitioning from being inside the uterus to being outside of the uterus. If you are planning to breastfeed, your health care team will help you begin a feeding. Stage 3  The third stage of labor starts immediately after the birth of your baby and ends after you deliver the placenta. The placenta is an organ that develops  during pregnancy to provide oxygen and nutrients to your baby in the womb.  Delivering the placenta may require some pushing, and you may have mild contractions. Breastfeeding can stimulate contractions to help you deliver the placenta.  After the placenta is delivered, your uterus should tighten (contract) and become firm. This helps to stop bleeding in your uterus. To help your uterus contract and to control bleeding, your health care provider may: ? Give you medicine by injection, through an IV tube, by mouth, or through your rectum (rectally). ? Massage your abdomen or perform a vaginal exam to remove any blood clots that are left in your uterus. ? Empty your bladder by placing a thin, flexible tube (catheter) into your bladder. ? Encourage   you to breastfeed your baby. After labor is over, you and your baby will be monitored closely to ensure that you are both healthy until you are ready to go home. Your health care team will teach you how to care for yourself and your baby. This information is not intended to replace advice given to you by your health care provider. Make sure you discuss any questions you have with your health care provider. Document Released: 07/09/2008 Document Revised: 04/19/2016 Document Reviewed: 10/15/2015 Elsevier Interactive Patient Education  2018 Elsevier Inc.  

## 2017-07-29 NOTE — Progress Notes (Signed)
ROB-Pt doing well, reports increased pelvic pressure and requests SVE.  Reviewed red flag symptoms and when to call. RTC x 1 week for ROB or sooner if needed.

## 2017-07-29 NOTE — Progress Notes (Signed)
ROB- pt is having a lot of pelvic pressure 

## 2017-08-04 ENCOUNTER — Ambulatory Visit (INDEPENDENT_AMBULATORY_CARE_PROVIDER_SITE_OTHER): Payer: Medicaid Other | Admitting: Certified Nurse Midwife

## 2017-08-04 ENCOUNTER — Encounter: Payer: Self-pay | Admitting: Certified Nurse Midwife

## 2017-08-04 VITALS — BP 114/69 | HR 105 | Wt 169.1 lb

## 2017-08-04 DIAGNOSIS — Z3493 Encounter for supervision of normal pregnancy, unspecified, third trimester: Secondary | ICD-10-CM

## 2017-08-04 LAB — POCT URINALYSIS DIPSTICK
BILIRUBIN UA: NEGATIVE
Blood, UA: NEGATIVE
GLUCOSE UA: NEGATIVE
KETONES UA: NEGATIVE
Nitrite, UA: NEGATIVE
Protein, UA: NEGATIVE
Urobilinogen, UA: 0.2 E.U./dL
pH, UA: 7.5 (ref 5.0–8.0)

## 2017-08-04 NOTE — Patient Instructions (Signed)
Vaginal Delivery Vaginal delivery means that you will give birth by pushing your baby out of your birth canal (vagina). A team of health care providers will help you before, during, and after vaginal delivery. Birth experiences are unique for every woman and every pregnancy, and birth experiences vary depending on where you choose to give birth. What should I do to prepare for my baby's birth? Before your baby is born, it is important to talk with your health care provider about:  Your labor and delivery preferences. These may include: ? Medicines that you may be given. ? How you will manage your pain. This might include non-medical pain relief techniques or injectable pain relief such as epidural analgesia. ? How you and your baby will be monitored during labor and delivery. ? Who may be in the labor and delivery room with you. ? Your feelings about surgical delivery of your baby (cesarean delivery, or C-section) if this becomes necessary. ? Your feelings about receiving donated blood through an IV tube (blood transfusion) if this becomes necessary.  Whether you are able: ? To take pictures or videos of the birth. ? To eat during labor and delivery. ? To move around, walk, or change positions during labor and delivery.  What to expect after your baby is born, such as: ? Whether delayed umbilical cord clamping and cutting is offered. ? Who will care for your baby right after birth. ? Medicines or tests that may be recommended for your baby. ? Whether breastfeeding is supported in your hospital or birth center. ? How long you will be in the hospital or birth center.  How any medical conditions you have may affect your baby or your labor and delivery experience.  To prepare for your baby's birth, you should also:  Attend all of your health care visits before delivery (prenatal visits) as recommended by your health care provider. This is important.  Prepare your home for your baby's  arrival. Make sure that you have: ? Diapers. ? Baby clothing. ? Feeding equipment. ? Safe sleeping arrangements for you and your baby.  Install a car seat in your vehicle. Have your car seat checked by a certified car seat installer to make sure that it is installed safely.  Think about who will help you with your new baby at home for at least the first several weeks after delivery.  What can I expect when I arrive at the birth center or hospital? Once you are in labor and have been admitted into the hospital or birth center, your health care provider may:  Review your pregnancy history and any concerns you have.  Insert an IV tube into one of your veins. This is used to give you fluids and medicines.  Check your blood pressure, pulse, temperature, and heart rate (vital signs).  Check whether your bag of water (amniotic sac) has broken (ruptured).  Talk with you about your birth plan and discuss pain control options.  Monitoring Your health care provider may monitor your contractions (uterine monitoring) and your baby's heart rate (fetal monitoring). You may need to be monitored:  Often, but not continuously (intermittently).  All the time or for long periods at a time (continuously). Continuous monitoring may be needed if: ? You are taking certain medicines, such as medicine to relieve pain or make your contractions stronger. ? You have pregnancy or labor complications.  Monitoring may be done by:  Placing a special stethoscope or a handheld monitoring device on your abdomen to   check your baby's heartbeat, and feeling your abdomen for contractions. This method of monitoring does not continuously record your baby's heartbeat or your contractions.  Placing monitors on your abdomen (external monitors) to record your baby's heartbeat and the frequency and length of contractions. You may not have to wear external monitors all the time.  Placing monitors inside of your uterus  (internal monitors) to record your baby's heartbeat and the frequency, length, and strength of your contractions. ? Your health care provider may use internal monitors if he or she needs more information about the strength of your contractions or your baby's heart rate. ? Internal monitors are put in place by passing a thin, flexible wire through your vagina and into your uterus. Depending on the type of monitor, it may remain in your uterus or on your baby's head until birth. ? Your health care provider will discuss the benefits and risks of internal monitoring with you and will ask for your permission before inserting the monitors.  Telemetry. This is a type of continuous monitoring that can be done with external or internal monitors. Instead of having to stay in bed, you are able to move around during telemetry. Ask your health care provider if telemetry is an option for you.  Physical exam Your health care provider may perform a physical exam. This may include:  Checking whether your baby is positioned: ? With the head toward your vagina (head-down). This is most common. ? With the head toward the top of your uterus (head-up or breech). If your baby is in a breech position, your health care provider may try to turn your baby to a head-down position so you can deliver vaginally. If it does not seem that your baby can be born vaginally, your provider may recommend surgery to deliver your baby. In rare cases, you may be able to deliver vaginally if your baby is head-up (breech delivery). ? Lying sideways (transverse). Babies that are lying sideways cannot be delivered vaginally.  Checking your cervix to determine: ? Whether it is thinning out (effacing). ? Whether it is opening up (dilating). ? How low your baby has moved into your birth canal.  What are the three stages of labor and delivery?  Normal labor and delivery is divided into the following three stages: Stage 1  Stage 1 is the  longest stage of labor, and it can last for hours or days. Stage 1 includes: ? Early labor. This is when contractions may be irregular, or regular and mild. Generally, early labor contractions are more than 10 minutes apart. ? Active labor. This is when contractions get longer, more regular, more frequent, and more intense. ? The transition phase. This is when contractions happen very close together, are very intense, and may last longer than during any other part of labor.  Contractions generally feel mild, infrequent, and irregular at first. They get stronger, more frequent (about every 2-3 minutes), and more regular as you progress from early labor through active labor and transition.  Many women progress through stage 1 naturally, but you may need help to continue making progress. If this happens, your health care provider may talk with you about: ? Rupturing your amniotic sac if it has not ruptured yet. ? Giving you medicine to help make your contractions stronger and more frequent.  Stage 1 ends when your cervix is completely dilated to 4 inches (10 cm) and completely effaced. This happens at the end of the transition phase. Stage 2  Once   your cervix is completely effaced and dilated to 4 inches (10 cm), you may start to feel an urge to push. It is common for the body to naturally take a rest before feeling the urge to push, especially if you received an epidural or certain other pain medicines. This rest period may last for up to 1-2 hours, depending on your unique labor experience.  During stage 2, contractions are generally less painful, because pushing helps relieve contraction pain. Instead of contraction pain, you may feel stretching and burning pain, especially when the widest part of your baby's head passes through the vaginal opening (crowning).  Your health care provider will closely monitor your pushing progress and your baby's progress through the vagina during stage 2.  Your  health care provider may massage the area of skin between your vaginal opening and anus (perineum) or apply warm compresses to your perineum. This helps it stretch as the baby's head starts to crown, which can help prevent perineal tearing. ? In some cases, an incision may be made in your perineum (episiotomy) to allow the baby to pass through the vaginal opening. An episiotomy helps to make the opening of the vagina larger to allow more room for the baby to fit through.  It is very important to breathe and focus so your health care provider can control the delivery of your baby's head. Your health care provider may have you decrease the intensity of your pushing, to help prevent perineal tearing.  After delivery of your baby's head, the shoulders and the rest of the body generally deliver very quickly and without difficulty.  Once your baby is delivered, the umbilical cord may be cut right away, or this may be delayed for 1-2 minutes, depending on your baby's health. This may vary among health care providers, hospitals, and birth centers.  If you and your baby are healthy enough, your baby may be placed on your chest or abdomen to help maintain the baby's temperature and to help you bond with each other. Some mothers and babies start breastfeeding at this time. Your health care team will dry your baby and help keep your baby warm during this time.  Your baby may need immediate care if he or she: ? Showed signs of distress during labor. ? Has a medical condition. ? Was born too early (prematurely). ? Had a bowel movement before birth (meconium). ? Shows signs of difficulty transitioning from being inside the uterus to being outside of the uterus. If you are planning to breastfeed, your health care team will help you begin a feeding. Stage 3  The third stage of labor starts immediately after the birth of your baby and ends after you deliver the placenta. The placenta is an organ that develops  during pregnancy to provide oxygen and nutrients to your baby in the womb.  Delivering the placenta may require some pushing, and you may have mild contractions. Breastfeeding can stimulate contractions to help you deliver the placenta.  After the placenta is delivered, your uterus should tighten (contract) and become firm. This helps to stop bleeding in your uterus. To help your uterus contract and to control bleeding, your health care provider may: ? Give you medicine by injection, through an IV tube, by mouth, or through your rectum (rectally). ? Massage your abdomen or perform a vaginal exam to remove any blood clots that are left in your uterus. ? Empty your bladder by placing a thin, flexible tube (catheter) into your bladder. ? Encourage   you to breastfeed your baby. After labor is over, you and your baby will be monitored closely to ensure that you are both healthy until you are ready to go home. Your health care team will teach you how to care for yourself and your baby. This information is not intended to replace advice given to you by your health care provider. Make sure you discuss any questions you have with your health care provider. Document Released: 07/09/2008 Document Revised: 04/19/2016 Document Reviewed: 10/15/2015 Elsevier Interactive Patient Education  2018 Elsevier Inc.  

## 2017-08-04 NOTE — Progress Notes (Signed)
ROB-Pt doing well, reports increased pelvic pressure and clear vaginal discharge. Discussed home labor preparation methods. Reviewed red flag symptoms and when to call. RTC x 1 week for ROB with Pattricia BossAnnie or sooner if needed.

## 2017-08-04 NOTE — Progress Notes (Signed)
ROB- no complaints.  

## 2017-08-05 ENCOUNTER — Telehealth: Payer: Self-pay | Admitting: Certified Nurse Midwife

## 2017-08-05 NOTE — Telephone Encounter (Signed)
Please contact patient. Cervix is flared. Approximately 4 cm on the outside, but less on the inside (approximately 2-3 cm). Half way soft, but still high. Contractions last night were probably from the vaginal exam during her office visit yesterday. She can take it easy, but going on a short work is not too strenuous of an activity and could help with labor. Should come into office or head to birthing suites for evaluation of contractions if they are uncomfortable happen every five minutes, lasting one (1) minute or sixty seconds for one (1) hour [the 5-1-1].   I am not sure when she will go into labor. It could happen at anytime. Thanks, JML.

## 2017-08-05 NOTE — Telephone Encounter (Signed)
Marcelino DusterMichelle,  I spoke with pt and informed her of your message. She understood your message but she just wanted to mention that she never dropped with her son, she was still high when she went into labor and was just concerned about it happening again

## 2017-08-05 NOTE — Telephone Encounter (Signed)
Patient called and stated that she would like to have a nurse call her back. The patient stated that she is experiencing a lot of pressure, and she is afraid that she will "Walk her child out". Patient was very short after I tried to get further information. No other information was disclosed. Please advise.

## 2017-08-05 NOTE — Telephone Encounter (Signed)
Pt called in concerned because she states she saw you yesterday and informed her that she is 4 cm but not clear percentage or expectaion of when she could go into labor. She states she has had increased pressure and had heavy contractions last night ranging from 2-5 mins to 10-15. She does not want to over exert herself or concerns if she tries to go for a walk it would jump start her labor

## 2017-08-05 NOTE — Telephone Encounter (Signed)
It is not uncommon for multips to "dilate in the sky" and baby may not drop until cervix is thinner and further dilated. She may walk around this for weeks, but if she is still concerned or pressure increases or contractions persists then she may come in for further evaluation. Thanks, JML

## 2017-08-06 NOTE — Telephone Encounter (Signed)
Marcelino DusterMichelle,   I spoke with pt and she still had more questions and concerns. Pt has an appt next week on Monday and states she is unable to come in this week and is unsure if you would be able to call her when you could.

## 2017-08-07 ENCOUNTER — Telehealth: Payer: Self-pay | Admitting: Certified Nurse Midwife

## 2017-08-07 NOTE — Telephone Encounter (Signed)
Telephone call to patient, verified full name and date of birth.   Discussed signs and symptoms of early labor. Reviewed red flag symptoms and when to call. Encouraged home treatment measures. Will contact patient regarding available openings for induction of labor this weekend.    Gunnar BullaJenkins Michelle Lawhorn, CNM [facility], [facility]

## 2017-08-07 NOTE — Telephone Encounter (Signed)
Telephone call to patient, verified full name and date of birth.   Advised induction of labor scheduled for Saturday, 08/09/2017 at 0500. Patient verbalized understanding. Questions regarding induction methods answered.   Reviewed red flag symptoms and when to call.    Gunnar BullaJenkins Michelle Kathelyn Gombos, CNM [facility], [facility]

## 2017-08-08 ENCOUNTER — Other Ambulatory Visit: Payer: Self-pay | Admitting: Certified Nurse Midwife

## 2017-08-09 ENCOUNTER — Inpatient Hospital Stay: Payer: Medicaid Other | Admitting: Anesthesiology

## 2017-08-09 ENCOUNTER — Inpatient Hospital Stay
Admission: RE | Admit: 2017-08-09 | Discharge: 2017-08-10 | DRG: 806 | Disposition: A | Discharge status: Home / Self Care | Payer: Medicaid Other | Attending: Certified Nurse Midwife | Admitting: Certified Nurse Midwife

## 2017-08-09 DIAGNOSIS — O9081 Anemia of the puerperium: Secondary | ICD-10-CM | POA: Diagnosis not present

## 2017-08-09 DIAGNOSIS — O4593 Premature separation of placenta, unspecified, third trimester: Secondary | ICD-10-CM | POA: Diagnosis present

## 2017-08-09 DIAGNOSIS — F17210 Nicotine dependence, cigarettes, uncomplicated: Secondary | ICD-10-CM | POA: Diagnosis present

## 2017-08-09 DIAGNOSIS — O26893 Other specified pregnancy related conditions, third trimester: Secondary | ICD-10-CM | POA: Diagnosis present

## 2017-08-09 DIAGNOSIS — D62 Acute posthemorrhagic anemia: Secondary | ICD-10-CM | POA: Diagnosis not present

## 2017-08-09 DIAGNOSIS — O99334 Smoking (tobacco) complicating childbirth: Secondary | ICD-10-CM | POA: Diagnosis present

## 2017-08-09 DIAGNOSIS — Z3A39 39 weeks gestation of pregnancy: Secondary | ICD-10-CM | POA: Diagnosis not present

## 2017-08-09 DIAGNOSIS — Z3490 Encounter for supervision of normal pregnancy, unspecified, unspecified trimester: Secondary | ICD-10-CM | POA: Diagnosis present

## 2017-08-09 LAB — CBC
HEMATOCRIT: 31.7 % — AB (ref 35.0–47.0)
HEMATOCRIT: 26.5 % — AB (ref 35.0–47.0)
Hemoglobin: 10.9 g/dL — ABNORMAL LOW (ref 12.0–16.0)
Hemoglobin: 9.2 g/dL — ABNORMAL LOW (ref 12.0–16.0)
MCH: 31.1 pg (ref 26.0–34.0)
MCH: 31.7 pg (ref 26.0–34.0)
MCHC: 34.5 g/dL (ref 32.0–36.0)
MCHC: 34.6 g/dL (ref 32.0–36.0)
MCV: 90 fL (ref 80.0–100.0)
MCV: 91.7 fL (ref 80.0–100.0)
PLATELETS: 195 10*3/uL (ref 150–440)
Platelets: 233 10*3/uL (ref 150–440)
RBC: 3.52 MIL/uL — ABNORMAL LOW (ref 3.80–5.20)
RBC: 2.89 MIL/uL — AB (ref 3.80–5.20)
RDW: 14.1 % (ref 11.5–14.5)
RDW: 13.9 % (ref 11.5–14.5)
WBC: 9.3 10*3/uL (ref 3.6–11.0)
WBC: 22.5 10*3/uL — AB (ref 3.6–11.0)

## 2017-08-09 MED ORDER — LACTATED RINGERS IV SOLN
500.0000 mL | INTRAVENOUS | Status: DC | PRN
  Administered 2017-08-09: 500 mL via INTRAVENOUS

## 2017-08-09 MED ORDER — IBUPROFEN 800 MG PO TABS
800.0000 mg | ORAL_TABLET | Freq: Three times a day (TID) | ORAL | Status: DC
  Administered 2017-08-09 – 2017-08-10 (×3): 800 mg via ORAL
  Filled 2017-08-09 (×3): qty 1

## 2017-08-09 MED ORDER — DOCUSATE SODIUM 100 MG PO CAPS
100.0000 mg | ORAL_CAPSULE | Freq: Two times a day (BID) | ORAL | Status: DC
  Administered 2017-08-09 – 2017-08-10 (×2): 100 mg via ORAL
  Filled 2017-08-09 (×2): qty 1

## 2017-08-09 MED ORDER — ACETAMINOPHEN 325 MG PO TABS
650.0000 mg | ORAL_TABLET | ORAL | Status: DC | PRN
Start: 2017-08-09 — End: 2017-08-09

## 2017-08-09 MED ORDER — METHYLERGONOVINE MALEATE 0.2 MG PO TABS
0.2000 mg | ORAL_TABLET | ORAL | Status: DC | PRN
  Administered 2017-08-09: 0.2 mg via ORAL
  Filled 2017-08-09 (×2): qty 1

## 2017-08-09 MED ORDER — FENTANYL 2.5 MCG/ML W/ROPIVACAINE 0.15% IN NS 100 ML EPIDURAL (ARMC)
12.0000 mL/h | EPIDURAL | Status: DC
  Administered 2017-08-09 (×2): 12 mL/h via EPIDURAL

## 2017-08-09 MED ORDER — ONDANSETRON HCL 4 MG/2ML IJ SOLN: 4.0000 mg | INTRAMUSCULAR | Status: DC | PRN

## 2017-08-09 MED ORDER — PRENATAL MULTIVITAMIN CH
1.0000 | ORAL_TABLET | Freq: Every day | ORAL | Status: DC
  Administered 2017-08-10: 1 via ORAL
  Filled 2017-08-09: qty 1

## 2017-08-09 MED ORDER — LIDOCAINE HCL (PF) 1 % IJ SOLN
INTRAMUSCULAR | Status: AC
  Filled 2017-08-09: qty 30

## 2017-08-09 MED ORDER — BUTORPHANOL TARTRATE 2 MG/ML IJ SOLN: 1.0000 mg | INTRAMUSCULAR | Status: DC | PRN

## 2017-08-09 MED ORDER — EPHEDRINE 5 MG/ML INJ: 10.0000 mg | INTRAVENOUS | Status: DC | PRN

## 2017-08-09 MED ORDER — TERBUTALINE SULFATE 1 MG/ML IJ SOLN: 0.2500 mg | Freq: Once | INTRAMUSCULAR | Status: DC | PRN

## 2017-08-09 MED ORDER — ZOLPIDEM TARTRATE 5 MG PO TABS: 5.0000 mg | ORAL_TABLET | Freq: Every evening | ORAL | Status: DC | PRN

## 2017-08-09 MED ORDER — PHENYLEPHRINE 40 MCG/ML (10ML) SYRINGE FOR IV PUSH (FOR BLOOD PRESSURE SUPPORT): 80.0000 ug | PREFILLED_SYRINGE | INTRAVENOUS | Status: DC | PRN

## 2017-08-09 MED ORDER — OXYTOCIN 10 UNIT/ML IJ SOLN
INTRAMUSCULAR | Status: AC
  Filled 2017-08-09: qty 2

## 2017-08-09 MED ORDER — BENZOCAINE-MENTHOL 20-0.5 % EX AERO: 1.0000 "application " | INHALATION_SPRAY | CUTANEOUS | Status: DC | PRN

## 2017-08-09 MED ORDER — DIPHENHYDRAMINE HCL 50 MG/ML IJ SOLN: 12.5000 mg | INTRAMUSCULAR | Status: DC | PRN

## 2017-08-09 MED ORDER — LIDOCAINE HCL (PF) 1 % IJ SOLN: 30.0000 mL | INTRAMUSCULAR | Status: DC | PRN

## 2017-08-09 MED ORDER — METHYLERGONOVINE MALEATE 0.2 MG/ML IJ SOLN
INTRAMUSCULAR | Status: AC
  Filled 2017-08-09: qty 1

## 2017-08-09 MED ORDER — LIDOCAINE-EPINEPHRINE (PF) 1.5 %-1:200000 IJ SOLN
INTRAMUSCULAR | Status: DC | PRN
  Administered 2017-08-09: 3 mL via EPIDURAL

## 2017-08-09 MED ORDER — OXYCODONE-ACETAMINOPHEN 5-325 MG PO TABS: 1.0000 | ORAL_TABLET | ORAL | Status: DC | PRN

## 2017-08-09 MED ORDER — METHYLERGONOVINE MALEATE 0.2 MG/ML IJ SOLN: 0.2000 mg | INTRAMUSCULAR | Status: DC | PRN

## 2017-08-09 MED ORDER — SENNOSIDES-DOCUSATE SODIUM 8.6-50 MG PO TABS
2.0000 | ORAL_TABLET | ORAL | Status: DC
  Administered 2017-08-10: 2 via ORAL
  Filled 2017-08-09: qty 2

## 2017-08-09 MED ORDER — DIBUCAINE 1 % RE OINT: 1.0000 "application " | TOPICAL_OINTMENT | RECTAL | Status: DC | PRN

## 2017-08-09 MED ORDER — OXYCODONE-ACETAMINOPHEN 5-325 MG PO TABS
1.0000 | ORAL_TABLET | ORAL | Status: DC | PRN
  Administered 2017-08-10: 1 via ORAL
  Filled 2017-08-09: qty 1

## 2017-08-09 MED ORDER — LACTATED RINGERS IV SOLN
500.0000 mL | Freq: Once | INTRAVENOUS | Status: AC
  Administered 2017-08-09: 500 mL via INTRAVENOUS

## 2017-08-09 MED ORDER — ACETAMINOPHEN 325 MG PO TABS
650.0000 mg | ORAL_TABLET | ORAL | Status: DC | PRN
  Administered 2017-08-10 (×2): 650 mg via ORAL
  Filled 2017-08-09 (×2): qty 2

## 2017-08-09 MED ORDER — ONDANSETRON HCL 4 MG/2ML IJ SOLN
4.0000 mg | Freq: Four times a day (QID) | INTRAMUSCULAR | Status: DC | PRN
  Filled 2017-08-09: qty 2

## 2017-08-09 MED ORDER — MISOPROSTOL 200 MCG PO TABS
800.0000 ug | ORAL_TABLET | Freq: Once | ORAL | Status: AC
  Administered 2017-08-09: 800 ug via RECTAL

## 2017-08-09 MED ORDER — LIDOCAINE HCL (PF) 1 % IJ SOLN
INTRAMUSCULAR | Status: DC | PRN
  Administered 2017-08-09: 1 mL via INTRADERMAL

## 2017-08-09 MED ORDER — SODIUM CHLORIDE 0.9 % IV SOLN
INTRAVENOUS | Status: DC | PRN
  Administered 2017-08-09 (×2): 5 mL via EPIDURAL

## 2017-08-09 MED ORDER — WITCH HAZEL-GLYCERIN EX PADS: 1.0000 "application " | MEDICATED_PAD | CUTANEOUS | Status: DC | PRN

## 2017-08-09 MED ORDER — AMMONIA AROMATIC IN INHA
RESPIRATORY_TRACT | Status: AC
  Filled 2017-08-09: qty 10

## 2017-08-09 MED ORDER — OXYTOCIN 40 UNITS IN LACTATED RINGERS INFUSION - SIMPLE MED
1.0000 m[IU]/min | INTRAVENOUS | Status: DC
  Administered 2017-08-09: 2 m[IU]/min via INTRAVENOUS
  Filled 2017-08-09: qty 1000

## 2017-08-09 MED ORDER — FENTANYL 2.5 MCG/ML W/ROPIVACAINE 0.15% IN NS 100 ML EPIDURAL (ARMC)
EPIDURAL | Status: AC
  Filled 2017-08-09: qty 100

## 2017-08-09 MED ORDER — OXYCODONE-ACETAMINOPHEN 5-325 MG PO TABS: 2.0000 | ORAL_TABLET | ORAL | Status: DC | PRN

## 2017-08-09 MED ORDER — HYDROCORTISONE 1 % EX CREA
TOPICAL_CREAM | Freq: Four times a day (QID) | CUTANEOUS | Status: DC
  Administered 2017-08-09: 22:00:00 via TOPICAL
  Filled 2017-08-09: qty 28

## 2017-08-09 MED ORDER — MISOPROSTOL 25 MCG QUARTER TABLET
ORAL_TABLET | ORAL | Status: AC
  Administered 2017-08-09: 50 ug via ORAL
  Filled 2017-08-09: qty 2

## 2017-08-09 MED ORDER — OXYTOCIN BOLUS FROM INFUSION
500.0000 mL | Freq: Once | INTRAVENOUS | Status: AC
  Administered 2017-08-09: 500 mL via INTRAVENOUS

## 2017-08-09 MED ORDER — TETANUS-DIPHTH-ACELL PERTUSSIS 5-2.5-18.5 LF-MCG/0.5 IM SUSP: 0.5000 mL | Freq: Once | INTRAMUSCULAR | Status: DC

## 2017-08-09 MED ORDER — SOD CITRATE-CITRIC ACID 500-334 MG/5ML PO SOLN: 30.0000 mL | ORAL | Status: DC | PRN

## 2017-08-09 MED ORDER — MISOPROSTOL 25 MCG QUARTER TABLET: 25.0000 ug | ORAL_TABLET | ORAL | Status: DC | PRN

## 2017-08-09 MED ORDER — DIPHENHYDRAMINE HCL 25 MG PO CAPS: 25.0000 mg | ORAL_CAPSULE | Freq: Four times a day (QID) | ORAL | Status: DC | PRN

## 2017-08-09 MED ORDER — COCONUT OIL OIL: 1.0000 "application " | TOPICAL_OIL | Status: DC | PRN

## 2017-08-09 MED ORDER — ONDANSETRON HCL 4 MG PO TABS
4.0000 mg | ORAL_TABLET | ORAL | Status: DC | PRN
  Administered 2017-08-09: 4 mg via ORAL

## 2017-08-09 MED ORDER — FLEET ENEMA 7-19 GM/118ML RE ENEM: 1.0000 | ENEMA | Freq: Once | RECTAL | Status: DC

## 2017-08-09 MED ORDER — OXYTOCIN 40 UNITS IN LACTATED RINGERS INFUSION - SIMPLE MED: 2.5000 [IU]/h | INTRAVENOUS | Status: DC

## 2017-08-09 MED ORDER — MISOPROSTOL 25 MCG QUARTER TABLET
50.0000 ug | ORAL_TABLET | ORAL | Status: DC | PRN
  Administered 2017-08-09: 50 ug via ORAL

## 2017-08-09 MED ORDER — LACTATED RINGERS IV SOLN
INTRAVENOUS | Status: DC
  Administered 2017-08-09: 06:00:00 via INTRAVENOUS
  Administered 2017-08-09: 1000 mL via INTRAVENOUS

## 2017-08-09 MED ORDER — SIMETHICONE 80 MG PO CHEW: 80.0000 mg | CHEWABLE_TABLET | ORAL | Status: DC | PRN

## 2017-08-09 NOTE — Progress Notes (Signed)
1810 In room to see patient, RN unable to assess fetal heart tones on external fetal monitor despite maternal position change. 1816 Fetal scalp electrode applied and SVE: 7-8/100/0, vertex. Intrauterine rescusitation measures attempted without response. Patient abdomen firm to touch. Pitocin stopped. Intrauterine resuscitation techniques continued.  1818: Telephone call to Dr. Logan BoresEvans. Advised to attempted pushing while retracting cervix. MD enroute to birthing suites. Terbutaline given. Maternal pushing efforts initiated with inadequate descent of fetal head and return of cervix noted after pushing. MD in room to evaluate, please see provider note for details of vacuum assisted birth.    Gunnar BullaJenkins Michelle Lawhorn, CNM 08/09/2017 7:48 PM

## 2017-08-09 NOTE — H&P (Signed)
Obstetric History and Physical  Lacey Bentley is a 26 y.o. [phone] with IUP at [redacted]w[redacted]d presenting for elective induction of labor at term. Patient states she has been having  irregular contractions, none vaginal bleeding, intact membranes, with active fetal movement.    Denies difficulty breathing or respiratory distress, chest pain, and leg pain or swelling.   Prenatal Course  Source of Care: [facility]: initial visit at 17 weeks, six (6) total visits  Pregnancy complications or risks: history of preterm birth, late entry prenatal care, limited prenatal care  Prenatal labs and studies:  ABO, Rh: AB/Positive/-- (05/31 1626)  Antibody: Negative (05/31 1626)  Rubella: 2.61 (05/31 1626)   Varicella: 1130 (05/31 1626)  RPR: Non Reactive (05/31 1626)   HBsAg: Negative (05/31 1626)   HIV: Non Reactive (05/31 1626)  WUX:LKGMWNUU (10/10 1155)  1 hr Glucola: Declined  Genetic screening: Declined  Anatomy US: Normal (06/21 7253)  Past Medical History:  Diagnosis Date  . Anxiety   . Depression   . History of preterm delivery    DELIVERED 5 WKS EARLY    Past Surgical History:  Procedure Laterality Date  . NO PAST SURGERIES      OB History  Gravida Para Term Preterm AB Living  9 5 4 1 3 5   SAB TAB Ectopic Multiple Live Births  1 2   0 5    # Outcome Date GA Lbr Len/2nd Weight Sex Delivery Anes PTL Lv  9 Current           8 Term 07/18/15 [redacted]w[redacted]d / 00:04 8 lb 0.8 oz (3.65 kg) M Vag-Spont   LIV  7 Term 07/26/13 [redacted]w[redacted]d  7 lb 8 oz (3.402 kg) F    LIV  6 Term 12/01/11 [redacted]w[redacted]d  7 lb 4 oz (3.289 kg) F    LIV  5 Term 05/03/10 [redacted]w[redacted]d  7 lb 4 oz (3.289 kg) F Vag-Spont   LIV  4 Preterm 03/23/09 [redacted]w[redacted]d  5 lb 9 oz (2.523 kg) F Vag-Spont   LIV  3 TAB           2 TAB           1 SAB               Social History   Social History  . Marital status: Married    Spouse name: N/A  . Number of children: N/A  . Years of education: N/A   Social History Main Topics  . Smoking  status: Current Every Day Smoker    Packs/day: 1.00    Years: 2.00  . Smokeless tobacco: Never Used  . Alcohol use No  . Drug use: No  . Sexual activity: Yes    Birth control/ protection: None   Other Topics Concern  . None   Social History Narrative  . None    Family History  Problem Relation Age of Onset  . Hypertension Father     Prescriptions Prior to Admission  Medication Sig Dispense Refill Last Dose  . Prenatal Vit-Fe Fumarate-FA (PRENATAL MULTIVITAMIN) TABS tablet Take 1 tablet by mouth daily at 12 noon.   08/08/2017 at Unknown time  . ranitidine (ZANTAC) 150 MG capsule Take 1 capsule (150 mg total) by mouth 2 (two) times daily. 60 capsule 5 Taking    No Known Allergies  Review of Systems: Negative except for what is mentioned in HPI.  Physical Exam:  Temp:  [98.4 F (36.9 C)] 98.4 F (36.9 C) (10/27 0524)  Pulse Rate:  [103] 103 (10/27 0524) Resp:  [18] 18 (10/27 0524) BP: (104)/(66) 104/66 (10/27 0524) Weight:  [169 lb (76.7 kg)] 169 lb (76.7 kg) (10/27 0524)  GENERAL: Well-developed, well-nourished female in no acute distress.   LUNGS: Clear to auscultation bilaterally.   HEART: Regular rate and rhythm.  ABDOMEN: Soft, nontender, nondistended, gravid.  EXTREMITIES: Nontender, no edema, 2+ distal pulses.  Cervical Exam: Dilation: 1.5 Effacement (%): 40 Cervical Position: Posterior Station: -3 Exam by:: ALogan Bores. Evans, RN  FHT:  Baseline rate 135 bpm   Variability moderate  Accelerations present   Decelerations none  Contractions: Occasional, soft resting tone   Pertinent Labs/Studies:    No results found for this or any previous visit (from the past 24 hour(s)).  Assessment :  Lacey Bentley is a 26 y.o. [phone] at 914w0d being admitted for induction of labor at term, late entry prenatal care, limited prenatal care, history of preterm birth, Rh positive, GBS negative  FHR Category I  Plan:  Induction/Augmentation as needed, per  protocol  FWB: Reassuring fetal heart tracing.  GBS negative  Delivery plan: Hopeful for vaginal delivery   Gunnar BullaJenkins Michelle Amayrany Cafaro, CNM [facility], [facility]

## 2017-08-09 NOTE — Progress Notes (Signed)
Lacey Bentley is a 26 y.o. [id number] at 924w0d by LMP admitted for induction of labor due to Elective at term.  Subjective:  Pt sitting in bed, breathing through contractions. Family members at bedside.   Denies difficulty breathing or respiratory distress, chest pain, vaginal bleeding, and leg pain or swelling.   Objective:  Temp:  [98.3 F (36.8 C)-98.4 F (36.9 C)] 98.3 F (36.8 C) (10/27 1156) Pulse Rate:  [67-103] 67 (10/27 1156) Resp:  [18] 18 (10/27 1156) BP: (101-104)/(61-66) 101/61 (10/27 1156) Weight:  [169 lb (76.7 kg)] 169 lb (76.7 kg) (10/27 0728)  FHT:  FHR: 135 bpm, variability: moderate,  accelerations:  Present,  decelerations:  Absent  UC:   regular, every two (2) to three (3) minutes, soft resting tone, pitocin at 6 mu/min  SVE:   Dilation: 4 Effacement (%): 60 Station: -1 Exam by:: Delia Chimesj. Suman Trivedi, CNM  AROM: clear fluid, moderate amount  Labs: Lab Results  Component Value Date   WBC 9.3 08/09/2017   HGB 10.9 (L) 08/09/2017   HCT 31.7 (L) 08/09/2017   MCV 90.0 08/09/2017   PLT 233 08/09/2017    Assessment:  Lacey Bentley is a 26 y.o. [phone] at 794w0d being admitted for induction of labor at term, late entry prenatal care, limited prenatal care, history of preterm birth, Rh positive, GBS negative  FHR Category I  Plan:  Continue orders as written.   Reassess as needed.    Gunnar BullaJenkins Michelle Naylee Frankowski, CNM 08/09/2017, 1:16 PM

## 2017-08-09 NOTE — Anesthesia Procedure Notes (Signed)
Epidural Patient location during procedure: OB Start time: 08/09/2017 3:08 PM End time: 08/09/2017 3:09 PM  Staffing Anesthesiologist: Margorie JohnPISCITELLO, JOSEPH K Performed: anesthesiologist   Preanesthetic Checklist Completed: patient identified, site marked, surgical consent, pre-op evaluation, timeout performed, IV checked, risks and benefits discussed and monitors and equipment checked  Epidural Patient position: sitting Prep: ChloraPrep Patient monitoring: heart rate, continuous pulse ox and blood pressure Approach: midline Location: L3-L4 Injection technique: LOR saline  Needle:  Needle type: Tuohy  Needle gauge: 17 G Needle length: 9 cm and 9 Needle insertion depth: 4 cm Catheter type: closed end flexible Catheter size: 19 Gauge Catheter at skin depth: 9 cm Test dose: negative and 1.5% lidocaine with Epi 1:200 K  Assessment Sensory level: T10 Events: blood not aspirated, injection not painful, no injection resistance, negative IV test and no paresthesia  Additional Notes 1 attempt  Pt. Evaluated and documentation done after procedure finished. Patient identified. Risks/Benefits/Options discussed with patient including but not limited to bleeding, infection, nerve damage, paralysis, failed block, incomplete pain control, headache, blood pressure changes, nausea, vomiting, reactions to medication both or allergic, itching and postpartum back pain. Confirmed with bedside nurse the patient's most recent platelet count. Confirmed with patient that they are not currently taking any anticoagulation, have any bleeding history or any family history of bleeding disorders. Patient expressed understanding and wished to proceed. All questions were answered. Sterile technique was used throughout the entire procedure. Please see nursing notes for vital signs. Test dose was given through epidural catheter and negative prior to continuing to dose epidural or start infusion. Warning signs of high  block given to the patient including shortness of breath, tingling/numbness in hands, complete motor block, or any concerning symptoms with instructions to call for help. Patient was given instructions on fall risk and not to get out of bed. All questions and concerns addressed with instructions to call with any issues or inadequate analgesia.   Patient tolerated the insertion well without immediate complications.Reason for block:procedure for pain

## 2017-08-09 NOTE — Anesthesia Preprocedure Evaluation (Signed)
Anesthesia Evaluation  Patient identified by MRN, date of birth, ID band Patient awake    Reviewed: Allergy & Precautions, H&P , NPO status , Patient's Chart, lab work & pertinent test results  Airway Mallampati: III  TM Distance: >3 FB Neck ROM: full    Dental  (+) Chipped   Pulmonary Current Smoker,           Cardiovascular Exercise Tolerance: Good (-) hypertensionnegative cardio ROS       Neuro/Psych PSYCHIATRIC DISORDERS Anxiety Depression    GI/Hepatic negative GI ROS,   Endo/Other    Renal/GU   negative genitourinary   Musculoskeletal   Abdominal   Peds  Hematology negative hematology ROS (+)   Anesthesia Other Findings Past Medical History: No date: Anxiety No date: Depression No date: History of preterm delivery     Comment:  DELIVERED 5 WKS EARLY  Past Surgical History: No date: NO PAST SURGERIES  BMI    Body Mass Index:  30.91 kg/m      Reproductive/Obstetrics (+) Pregnancy                             Anesthesia Physical Anesthesia Plan  ASA: II  Anesthesia Plan: Epidural   Post-op Pain Management:    Induction:   PONV Risk Score and Plan:   Airway Management Planned:   Additional Equipment:   Intra-op Plan:   Post-operative Plan:   Informed Consent: I have reviewed the patients History and Physical, chart, labs and discussed the procedure including the risks, benefits and alternatives for the proposed anesthesia with the patient or authorized representative who has indicated his/her understanding and acceptance.     Plan Discussed with: Anesthesiologist  Anesthesia Plan Comments:         Anesthesia Quick Evaluation

## 2017-08-09 NOTE — Progress Notes (Signed)
Lacey Bentley is a 26 y.o. [id number] at 6375w0d by LMP admitted for induction of labor due to Elective at term.  Subjective:  Pt doing well, reports relief of pain with epidural placement. Family members at beside.   Denies difficulty breathing or respiratory distress, chest pain, abdominal pain, vaginal bleeding, and leg pain or swelling.   Objective:  Temp:  [98.3 F (36.8 C)-98.4 F (36.9 C)] 98.3 F (36.8 C) (10/27 1156) Pulse Rate:  [67-103] 80 (10/27 1544) Resp:  [18] 18 (10/27 1516) BP: (89-104)/(56-66) 89/56 (10/27 1544) SpO2:  [100 %] 100 % (10/27 1516) Weight:  [169 lb (76.7 kg)] 169 lb (76.7 kg) (10/27 0728)  FHT:  FHR: 130 bpm, variability: moderate,  accelerations:  Present,  decelerations:  Absent   UC:   regular, every four (4) to five (5) minutes, soft resting tone, pitocin at 13 mu/min  SVE:   Dilation: 6.5 Effacement (%): 90 Station: -1 Exam by:: J. Ijanae Macapagal,CNM  Labs: Lab Results  Component Value Date   WBC 9.3 08/09/2017   HGB 10.9 (L) 08/09/2017   HCT 31.7 (L) 08/09/2017   MCV 90.0 08/09/2017   PLT 233 08/09/2017    Assessment:  Stevenson ClinchKarly C Bentley [name] a 26 y.A.[id number].[id number] at 2975w0d being admitted for induction of labor at term, late entry prenatal care, limited prenatal care, history of preterm birth, Rh positive, GBS negative  FHR Category I  Plan:  Encouraged position change and use of peanut ball.   Reviewed red flag symptoms and when to call.   Continue orders as written. Reassess as needed.    Gunnar BullaJenkins Michelle Lavena Loretto, CNM 08/09/2017, 4:54 PM

## 2017-08-10 DIAGNOSIS — Z3A39 39 weeks gestation of pregnancy: Secondary | ICD-10-CM

## 2017-08-10 LAB — CBC
HCT: 28 % — ABNORMAL LOW (ref 35.0–47.0)
HEMATOCRIT: 20.9 % — AB (ref 35.0–47.0)
HEMOGLOBIN: 7.1 g/dL — AB (ref 12.0–16.0)
HEMOGLOBIN: 10.1 g/dL — AB (ref 12.0–16.0)
MCH: 31.4 pg (ref 26.0–34.0)
MCH: 34.3 pg — AB (ref 26.0–34.0)
MCHC: 34 g/dL (ref 32.0–36.0)
MCHC: 36.2 g/dL — ABNORMAL HIGH (ref 32.0–36.0)
MCV: 92.3 fL (ref 80.0–100.0)
MCV: 94.8 fL (ref 80.0–100.0)
Platelets: 179 10*3/uL (ref 150–440)
Platelets: 200 10*3/uL (ref 150–440)
RBC: 2.27 MIL/uL — AB (ref 3.80–5.20)
RBC: 2.95 MIL/uL — AB (ref 3.80–5.20)
RDW: 14 % (ref 11.5–14.5)
RDW: 13.8 % (ref 11.5–14.5)
WBC: 14.3 10*3/uL — AB (ref 3.6–11.0)
WBC: 12.3 10*3/uL — AB (ref 3.6–11.0)

## 2017-08-10 LAB — PREPARE RBC (CROSSMATCH)

## 2017-08-10 LAB — RPR: RPR Ser Ql: NONREACTIVE

## 2017-08-10 MED ORDER — FERROUS SULFATE 325 (65 FE) MG PO TABS: 325.0000 mg | ORAL_TABLET | Freq: Three times a day (TID) | ORAL | 3 refills | Status: DC

## 2017-08-10 MED ORDER — IBUPROFEN 800 MG PO TABS: 800.0000 mg | ORAL_TABLET | Freq: Three times a day (TID) | ORAL | 0 refills | Status: DC

## 2017-08-10 MED ORDER — FERROUS SULFATE 325 (65 FE) MG PO TABS
325.0000 mg | ORAL_TABLET | Freq: Three times a day (TID) | ORAL | Status: DC
  Administered 2017-08-10 (×2): 325 mg via ORAL
  Filled 2017-08-10 (×2): qty 1

## 2017-08-10 MED ORDER — SODIUM CHLORIDE 0.9 % IV SOLN
Freq: Once | INTRAVENOUS | Status: AC
  Administered 2017-08-10: 14:00:00 via INTRAVENOUS

## 2017-08-10 NOTE — Discharge Summary (Signed)
  Obstetric Discharge Summary  Patient ID: Lacey Bentley MRN: [id number] DOB/AGE: 25/01/1991 25 y.o.   Date of Admission: 08/09/2017 Lacey Bentley, CNM Lacey Bentley(D. Evans, MD)  Date of Discharge: 08/10/2017 Lacey Bentley, CNM Lacey Bentley(D. Evans, MD)  Admitting Diagnosis: Induction of labor at 4267w0d  Secondary Diagnosis: Anemia in pregnancy  Mode of Delivery: vacuum-assisted vaginal delivery     Discharge Diagnosis: No other diagnosis   Intrapartum Procedures: Atificial rupture of membranes, epidural, pitocin augmentation and placement of fetal scalp electrode   Post partum procedures: blood transfusion  Complications: Suspected placental aburption   Brief Hospital Course  Lacey Bentley is a [id number] who had a SVD on 08/09/2017;  for further details of this birth, please refer to the delivey note.  Postpartum course was complicated by symptomatic anemia requiring a blood transfusion.  After completion of blood transfusion, patient stable and requested discharge home. By time of discharge on PPD#1, her pain was controlled on oral pain medications; she had appropriate lochia and was ambulating, voiding without difficulty and tolerating regular diet.  She was deemed stable for discharge to home.     Labs:  CBC Latest Ref Rng & Units 08/10/2017 08/10/2017 08/09/2017  WBC 3.6 - 11.0 K/uL 12.3(H) 14.3(H) 22.5(H)  Hemoglobin 12.0 - 16.0 g/dL 10.1(L) 7.1(L) 9.2(L)  Hematocrit 35.0 - 47.0 % 28.0(L) 20.9(L) 26.5(L)  Platelets 150 - 440 K/uL 200 179 195   AB POS  Physical exam:   Blood pressure (!) 111/59, pulse (!) 58, temperature 98.4 F (36.9 C), temperature source Oral, resp. rate 18, height 5\' 2"  (1.575 m), weight 169 lb (76.7 kg), last menstrual period 11/09/2016, SpO2 99 %, unknown if currently breastfeeding.  General: alert and no distress  Lochia: appropriate  Abdomen: soft, NT  Uterine Fundus: firm  Perineum : intact  Extremities: No evidence of DVT seen on  physical exam. No lower extremity edema.  Discharge Instructions: Per After Visit Summary.  Activity: Advance as tolerated. Pelvic rest for 6 weeks.  Also refer to After Visit Summary  Diet: Regular  Medications:  Allergies as of 08/10/2017   No Known Allergies     Medication List    TAKE these medications   ferrous sulfate 325 (65 FE) MG tablet Take 1 tablet (325 mg total) by mouth 3 (three) times daily with meals.   ibuprofen 800 MG tablet Commonly known as:  ADVIL,MOTRIN Take 1 tablet (800 mg total) by mouth every 8 (eight) hours.   prenatal multivitamin Tabs tablet Take 1 tablet by mouth daily at 12 noon.   ranitidine 150 MG capsule Commonly known as:  ZANTAC Take 1 capsule (150 mg total) by mouth 2 (two) times daily.      Outpatient follow up:  Follow-up Information    Lacey Bentley, Lacey Michelle, CNM Follow up.   Specialties:  Certified Nurse Midwife, Obstetrics and Gynecology, Radiology Why:  Please call to schedule six week postpartum appointment with Lacey Bentley, CNM Contact information: [address] [place] [id number] [phone]          Postpartum contraception: vasectomy  Discharged Condition: stable  Discharged to: home   Newborn Data:  Disposition:home with mother  Apgars: APGAR (1 MIN): 7   APGAR (5 MINS): 7      Baby Feeding: Breast   Lacey BullaJenkins Michelle Bentley, CNM

## 2017-08-10 NOTE — Anesthesia Postprocedure Evaluation (Signed)
Anesthesia Post Note  Patient: Lacey Bentley  Procedure(s) Performed: AN AD HOC LABOR EPIDURAL  Patient location during evaluation: Mother Baby Anesthesia Type: Epidural Level of consciousness: awake and alert Pain management: pain level controlled Vital Signs Assessment: post-procedure vital signs reviewed and stable Respiratory status: spontaneous breathing, nonlabored ventilation and respiratory function stable Cardiovascular status: stable Postop Assessment: no headache and no backache Anesthetic complications: no     Last Vitals:  Vitals:   08/10/17 0416 08/10/17 0818  BP: 102/67 (!) 94/49  Pulse: (!) 59 68  Resp: 18 18  Temp: 36.8 C 36.8 C  SpO2: 100% 100%    Last Pain:  Vitals:   08/10/17 0850  TempSrc:   PainSc: 8                  Lenard SimmerAndrew Enedelia Martorelli

## 2017-08-10 NOTE — Progress Notes (Signed)
Post Partum Day 1/2  Subjective:  Pt sitting in bed, husband and infant at bedside. No questions or concerns.   Denies difficulty breathing or respiratory distress, chest pain, abdominal pain, excessive vaginal bleeding, and leg pain or swelling.   Objective:  Temp:  [98.2 F (36.8 C)-98.3 F (36.8 C)] 98.3 F (36.8 C) (10/28 0818) Pulse Rate:  [59-126] 68 (10/28 0818) Resp:  [18] 18 (10/28 0818) BP: (71-104)/(34-72) 94/49 (10/28 0818) SpO2:  [99 %-100 %] 100 % (10/28 0818)  Physical Exam:   General: alert and cooperative  CVS exam: normal rate, regular rhythm, normal S1, S2, no murmurs, rubs, clicks or gallops.  Lungs - Normal respiratory effort, chest expands symmetrically. Lungs are clear to auscultation, no crackles or wheezes.  Lochia: appropriate  Uterine Fundus: firm  Perineum: intact  DVT Evaluation: No evidence of DVT seen on physical exam. Negative Homan's sign.   Recent Labs  08/09/17 2207 08/10/17 0553  HGB 9.2* 7.1*  HCT 26.5* 20.9*    Assessment:  Status post vacuum assisted vaginal birth  Postpartum anemia-due to blood loss   Rh positive   Breastfeeding  Plan:  PO Iron supplementation.   Transfuse two (2) units PRBCs.  May discharge home this evening in hemoglobin > 9.0  Continue orders as written. Reassess as needed.    LOS: 1 day   Gunnar BullaJenkins Michelle Seven Dollens, CNM 08/10/2017, 9:48 AM

## 2017-08-11 ENCOUNTER — Encounter: Payer: Medicaid Other | Admitting: Certified Nurse Midwife

## 2017-08-12 LAB — SURGICAL PATHOLOGY

## 2017-08-26 LAB — TYPE AND SCREEN
ABO/RH(D): AB POS
ANTIBODY SCREEN: NEGATIVE
Unit division: 0
Unit division: 0

## 2017-08-26 LAB — BPAM RBC
BLOOD PRODUCT EXPIRATION DATE: 201811152359
Blood Product Expiration Date: 201811152359
ISSUE DATE / TIME: 201810281322
ISSUE DATE / TIME: 201810281646
UNIT TYPE AND RH: 5100
Unit Type and Rh: 5100

## 2018-01-15 ENCOUNTER — Encounter: Payer: Self-pay | Admitting: Emergency Medicine

## 2018-01-15 ENCOUNTER — Other Ambulatory Visit: Payer: Self-pay

## 2018-01-15 ENCOUNTER — Inpatient Hospital Stay: Payer: Medicaid Other | Admitting: Anesthesiology

## 2018-01-15 ENCOUNTER — Inpatient Hospital Stay
Admission: EM | Admit: 2018-01-15 | Discharge: 2018-01-16 | DRG: 446 | Disposition: A | Discharge status: Home / Self Care | Payer: Medicaid Other | Attending: Internal Medicine | Admitting: Internal Medicine

## 2018-01-15 ENCOUNTER — Inpatient Hospital Stay: Payer: Medicaid Other

## 2018-01-15 ENCOUNTER — Encounter
Admission: EM | Disposition: A | Discharge status: Home / Self Care | Payer: Self-pay | Source: Home / Self Care | Attending: Internal Medicine

## 2018-01-15 ENCOUNTER — Emergency Department: Payer: Medicaid Other

## 2018-01-15 DIAGNOSIS — I959 Hypotension, unspecified: Secondary | ICD-10-CM | POA: Diagnosis not present

## 2018-01-15 DIAGNOSIS — F17210 Nicotine dependence, cigarettes, uncomplicated: Secondary | ICD-10-CM | POA: Diagnosis present

## 2018-01-15 DIAGNOSIS — K8050 Calculus of bile duct without cholangitis or cholecystitis without obstruction: Secondary | ICD-10-CM

## 2018-01-15 DIAGNOSIS — Z91048 Other nonmedicinal substance allergy status: Secondary | ICD-10-CM

## 2018-01-15 DIAGNOSIS — Z79899 Other long term (current) drug therapy: Secondary | ICD-10-CM

## 2018-01-15 DIAGNOSIS — R748 Abnormal levels of other serum enzymes: Secondary | ICD-10-CM | POA: Diagnosis not present

## 2018-01-15 DIAGNOSIS — K8070 Calculus of gallbladder and bile duct without cholecystitis without obstruction: Secondary | ICD-10-CM | POA: Diagnosis present

## 2018-01-15 DIAGNOSIS — R17 Unspecified jaundice: Secondary | ICD-10-CM | POA: Diagnosis not present

## 2018-01-15 DIAGNOSIS — R1010 Upper abdominal pain, unspecified: Secondary | ICD-10-CM

## 2018-01-15 DIAGNOSIS — R109 Unspecified abdominal pain: Secondary | ICD-10-CM | POA: Diagnosis present

## 2018-01-15 HISTORY — DX: Headache, unspecified: R51.9

## 2018-01-15 HISTORY — DX: Headache: R51

## 2018-01-15 HISTORY — PX: ENDOSCOPIC RETROGRADE CHOLANGIOPANCREATOGRAPHY (ERCP) WITH PROPOFOL: SHX5810

## 2018-01-15 LAB — COMPREHENSIVE METABOLIC PANEL
ALBUMIN: 4.5 g/dL (ref 3.5–5.0)
ALT: 489 U/L — ABNORMAL HIGH (ref 14–54)
ANION GAP: 8 (ref 5–15)
AST: 300 U/L — ABNORMAL HIGH (ref 15–41)
Alkaline Phosphatase: 160 U/L — ABNORMAL HIGH (ref 38–126)
BILIRUBIN TOTAL: 2.9 mg/dL — AB (ref 0.3–1.2)
BUN: 8 mg/dL (ref 6–20)
CO2: 25 mmol/L (ref 22–32)
Calcium: 9.4 mg/dL (ref 8.9–10.3)
Chloride: 107 mmol/L (ref 101–111)
Creatinine, Ser: 0.69 mg/dL (ref 0.44–1.00)
GFR calc non Af Amer: >60 mL/min (ref >60)
GLUCOSE: 113 mg/dL — AB (ref 65–99)
Potassium: 3.8 mmol/L (ref 3.5–5.1)
SODIUM: 140 mmol/L (ref 135–145)
TOTAL PROTEIN: 8 g/dL (ref 6.5–8.1)

## 2018-01-15 LAB — URINALYSIS, COMPLETE (UACMP) WITH MICROSCOPIC
BACTERIA UA: NONE SEEN
BILIRUBIN URINE: NEGATIVE
Glucose, UA: NEGATIVE mg/dL
KETONES UR: NEGATIVE mg/dL
LEUKOCYTES UA: NEGATIVE
NITRITE: NEGATIVE
Protein, ur: NEGATIVE mg/dL
SPECIFIC GRAVITY, URINE: 1.002 — AB (ref 1.005–1.030)
WBC UA: NONE SEEN WBC/hpf (ref 0–5)
pH: 7 (ref 5.0–8.0)

## 2018-01-15 LAB — CBC
HEMATOCRIT: 40 % (ref 35.0–47.0)
Hemoglobin: 13.7 g/dL (ref 12.0–16.0)
MCH: 30.2 pg (ref 26.0–34.0)
MCHC: 34.3 g/dL (ref 32.0–36.0)
MCV: 88.1 fL (ref 80.0–100.0)
Platelets: 278 10*3/uL (ref 150–440)
RBC: 4.54 MIL/uL (ref 3.80–5.20)
RDW: 14.5 % (ref 11.5–14.5)
WBC: 11.6 10*3/uL — ABNORMAL HIGH (ref 3.6–11.0)

## 2018-01-15 LAB — LIPASE, BLOOD: Lipase: 7737 U/L — ABNORMAL HIGH (ref 11–51)

## 2018-01-15 LAB — POCT PREGNANCY, URINE: Preg Test, Ur: NEGATIVE

## 2018-01-15 SURGERY — ENDOSCOPIC RETROGRADE CHOLANGIOPANCREATOGRAPHY (ERCP) WITH PROPOFOL
Anesthesia: General

## 2018-01-15 MED ORDER — PROPOFOL 500 MG/50ML IV EMUL
INTRAVENOUS | Status: DC | PRN
Start: 2018-01-15 — End: 2018-01-15
  Administered 2018-01-15: 150 ug/kg/min via INTRAVENOUS

## 2018-01-15 MED ORDER — LIDOCAINE HCL (CARDIAC) 20 MG/ML IV SOLN
INTRAVENOUS | Status: DC | PRN
  Administered 2018-01-15: 40 mg via INTRAVENOUS

## 2018-01-15 MED ORDER — ACETAMINOPHEN 650 MG RE SUPP: 650.0000 mg | Freq: Four times a day (QID) | RECTAL | Status: DC | PRN

## 2018-01-15 MED ORDER — PROPOFOL 500 MG/50ML IV EMUL
INTRAVENOUS | Status: AC
  Filled 2018-01-15: qty 50

## 2018-01-15 MED ORDER — SODIUM CHLORIDE 0.9 % IV SOLN
INTRAVENOUS | Status: DC
  Administered 2018-01-15 – 2018-01-16 (×2): via INTRAVENOUS

## 2018-01-15 MED ORDER — ACETAMINOPHEN 325 MG PO TABS: 650.0000 mg | ORAL_TABLET | Freq: Four times a day (QID) | ORAL | Status: DC | PRN

## 2018-01-15 MED ORDER — INDOMETHACIN 50 MG RE SUPP
100.0000 mg | Freq: Once | RECTAL | Status: AC
  Administered 2018-01-15: 100 mg via RECTAL

## 2018-01-15 MED ORDER — FENTANYL CITRATE (PF) 100 MCG/2ML IJ SOLN
INTRAMUSCULAR | Status: DC | PRN
  Administered 2018-01-15: 50 ug via INTRAVENOUS

## 2018-01-15 MED ORDER — LACTATED RINGERS IV SOLN
INTRAVENOUS | Status: DC | PRN
  Administered 2018-01-15: 16:00:00 via INTRAVENOUS

## 2018-01-15 MED ORDER — ONDANSETRON HCL 4 MG/2ML IJ SOLN: 4.0000 mg | Freq: Four times a day (QID) | INTRAMUSCULAR | Status: DC | PRN

## 2018-01-15 MED ORDER — TRAMADOL HCL 50 MG PO TABS: 50.0000 mg | ORAL_TABLET | Freq: Four times a day (QID) | ORAL | Status: DC | PRN

## 2018-01-15 MED ORDER — GI COCKTAIL ~~LOC~~
30.0000 mL | Freq: Once | ORAL | Status: AC
  Administered 2018-01-15: 30 mL via ORAL
  Filled 2018-01-15: qty 30

## 2018-01-15 MED ORDER — MIDAZOLAM HCL 2 MG/2ML IJ SOLN
INTRAMUSCULAR | Status: AC
  Filled 2018-01-15: qty 2

## 2018-01-15 MED ORDER — POLYETHYLENE GLYCOL 3350 17 G PO PACK: 17.0000 g | PACK | Freq: Every day | ORAL | Status: DC | PRN

## 2018-01-15 MED ORDER — ONDANSETRON HCL 4 MG PO TABS: 4.0000 mg | ORAL_TABLET | Freq: Four times a day (QID) | ORAL | Status: DC | PRN

## 2018-01-15 MED ORDER — ONDANSETRON 4 MG PO TBDP
4.0000 mg | ORAL_TABLET | Freq: Once | ORAL | Status: AC
  Administered 2018-01-15: 4 mg via ORAL
  Filled 2018-01-15: qty 1

## 2018-01-15 MED ORDER — KETOROLAC TROMETHAMINE 30 MG/ML IJ SOLN: 30.0000 mg | Freq: Four times a day (QID) | INTRAMUSCULAR | Status: DC | PRN

## 2018-01-15 MED ORDER — SODIUM CHLORIDE 0.9 % IV SOLN
INTRAVENOUS | Status: DC
  Administered 2018-01-15: 1000 mL via INTRAVENOUS

## 2018-01-15 MED ORDER — PROPOFOL 10 MG/ML IV BOLUS
INTRAVENOUS | Status: DC | PRN
  Administered 2018-01-15: 60 mg via INTRAVENOUS

## 2018-01-15 MED ORDER — MIDAZOLAM HCL 2 MG/2ML IJ SOLN
INTRAMUSCULAR | Status: DC | PRN
  Administered 2018-01-15: 2 mg via INTRAVENOUS

## 2018-01-15 MED ORDER — FENTANYL CITRATE (PF) 100 MCG/2ML IJ SOLN
INTRAMUSCULAR | Status: AC
  Filled 2018-01-15: qty 2

## 2018-01-15 NOTE — ED Provider Notes (Signed)
[facility] Emergency Department Provider Note   ____________________________________________    I have reviewed the triage vital signs and the nursing notes.   HISTORY  Chief Complaint Abdominal Pain     HPI Lacey Bentley is a 27 y.o. female who presents with complaints of abdominal pain.  She reports over the last several months she has had intermittent episodes of pain in her back but over the last 3 days she has had epigastric pain which apparently started after eating a cheeseburger.  The pain is central epigastrium, no right upper quadrant pain.  No history of gallbladder disease.  No fevers or chills.  After eating she vomited secondary to the pain.  Currently she feels better.  No fevers or chills.  Has not taken anything for this abdominal pain, in the past is taken Motrin for her back pain which has helped   Past Medical History:  Diagnosis Date  . Anxiety   . Depression   . History of preterm delivery    DELIVERED 5 WKS EARLY    Patient Active Problem List   Diagnosis Date Noted  . Encounter for induction of labor 08/09/2017  . Late prenatal care 03/15/2017  . Nausea and vomiting during pregnancy prior to [redacted] weeks gestation 03/15/2017  . History of preterm delivery, currently pregnant in second trimester 03/15/2017  . Irregular uterine contractions 07/08/2015    Past Surgical History:  Procedure Laterality Date  . NO PAST SURGERIES      Prior to Admission medications   Medication Sig Start Date End Date Taking? Authorizing Provider  ferrous sulfate 325 (65 FE) MG tablet Take 1 tablet (325 mg total) by mouth 3 (three) times daily with meals. Patient not taking: Reported on 01/15/2018 08/10/17   Diona Fanti, CNM  ibuprofen (ADVIL,MOTRIN) 800 MG tablet Take 1 tablet (800 mg total) by mouth every 8 (eight) hours. Patient not taking: Reported on 01/15/2018 08/10/17   Diona Fanti, CNM  ranitidine  (ZANTAC) 150 MG capsule Take 1 capsule (150 mg total) by mouth 2 (two) times daily. Patient not taking: Reported on 01/15/2018 03/13/17   Diona Fanti, CNM     Allergies Sunscreens  Family History  Problem Relation Age of Onset  . Hypertension Father     Social History Social History   Tobacco Use  . Smoking status: Current Every Day Smoker    Packs/day: 1.00    Years: 2.00    Pack years: 2.00  . Smokeless tobacco: Never Used  Substance Use Topics  . Alcohol use: No  . Drug use: No    Review of Systems  Constitutional: No fever/chills Eyes: No discharge ENT: No sore throat. Cardiovascular: Denies chest pain. Respiratory: Denies shortness of breath. Gastrointestinal: As above Genitourinary: Negative for dysuria. Musculoskeletal: Intermittent upper back pain as above Skin: Negative for rash. Neurological: Negative for headaches    ____________________________________________   PHYSICAL EXAM:  VITAL SIGNS: ED Triage Vitals  Enc Vitals Group     BP 01/15/18 0753 119/74     Pulse Rate 01/15/18 0753 (!) 120     Resp 01/15/18 0753 17     Temp 01/15/18 0753 98.3 F (36.8 C)     Temp Source 01/15/18 0753 Oral     SpO2 01/15/18 0753 100 %     Weight 01/15/18 0754 69.4 kg (153 lb)     Height 01/15/18 0754 1.575 m (5' 2" )     Head Circumference --  Peak Flow --      Pain Score 01/15/18 0754 7     Pain Loc --      Pain Edu? --      Excl. in [place]? --     Constitutional: Alert and oriented. No acute distress.  Eyes: Conjunctivae are normal.   Nose: No congestion/rhinnorhea. Mouth/Throat: Mucous membranes are moist.   Neck:  Painless ROM Cardiovascular: Normal rate, regular rhythm. Grossly normal heart sounds.  Good peripheral circulation. Respiratory: Normal respiratory effort.  No retractions. Lungs CTAB. Gastrointestinal: Moderate tenderness over the epigastrium, no right upper quadrant tenderness no distention.  No CVA  tenderness. Genitourinary: deferred Musculoskeletal: Back: Bilateral paraspinal mild tenderness at approximately T3-T6 which mimics her pain.  No vertebral tenderness to palpation.  Warm and well perfused extremities Neurologic:  Normal speech and language. No gross focal neurologic deficits are appreciated.  Skin:  Skin is warm, dry and intact. No rash noted. Psychiatric: Mood and affect are normal. Speech and behavior are normal.  ____________________________________________   LABS (all labs ordered are listed, but only abnormal results are displayed)  Labs Reviewed  COMPREHENSIVE METABOLIC PANEL - Abnormal; Notable for the following components:      Result Value   Glucose, Bld 113 (*)    AST 300 (*)    ALT 489 (*)    Alkaline Phosphatase 160 (*)    Total Bilirubin 2.9 (*)    All other components within normal limits  CBC - Abnormal; Notable for the following components:   WBC 11.6 (*)    All other components within normal limits  URINALYSIS, COMPLETE (UACMP) WITH MICROSCOPIC - Abnormal; Notable for the following components:   Color, Urine YELLOW (*)    APPearance CLEAR (*)    Specific Gravity, Urine 1.002 (*)    Hgb urine dipstick MODERATE (*)    Squamous Epithelial / LPF 0-5 (*)    All other components within normal limits  LIPASE, BLOOD  POCT PREGNANCY, URINE  POC URINE PREG, ED   ____________________________________________  EKG  None ____________________________________________  RADIOLOGY  Right upper quadrant ultrasound ____________________________________________   PROCEDURES  Procedure(s) performed: No  Procedures   Critical Care performed: No ____________________________________________   INITIAL IMPRESSION / ASSESSMENT AND PLAN / ED COURSE  Pertinent labs & imaging results that were available during my care of the patient were reviewed by me and considered in my medical decision making (see chart for details).  Patient presents with  epigastric abdominal pain.  She is tachycardic upon arrival however this rapidly improved without treatment.  Tenderness to palpation over the epigastrium, lab work demonstrates elevated AST ALT and alk phos with mild elevation white blood cell count.  Will obtain right upper quadrant ultrasound and reevaluate.  Right upper quadrant ultrasound appears consistent with choledocholithiasis, discussed with Dr. Allen Norris of GI who recommends admission to the hospitalist service and he will arrange for ERCP  Discussed with patient who understands the need for procedure and admission  Pain appears to be waxing and waning   ____________________________________________   FINAL CLINICAL IMPRESSION(S) / ED DIAGNOSES  Final diagnoses:  Upper abdominal pain  Choledocholithiasis        Note:  This document was prepared using Dragon voice recognition software and may include unintentional dictation errors.    Lavonia Drafts, MD 01/15/18 1039

## 2018-01-15 NOTE — ED Notes (Signed)
Consent signed and sent with patient to ENDO. Brayton CavesJessie RN informed. Pt being [facility] by [employer] [facility]

## 2018-01-15 NOTE — Transfer of Care (Signed)
Immediate Anesthesia Transfer of Care Note  Patient: Lacey Bentley  Procedure(s) Performed: ENDOSCOPIC RETROGRADE CHOLANGIOPANCREATOGRAPHY (ERCP) WITH PROPOFOL (N/A )  Patient Location: PACU  Anesthesia Type:General  Level of Consciousness: awake  Airway & Oxygen Therapy: Patient Spontanous Breathing and Patient connected to nasal cannula oxygen  Post-op Assessment: Report given to RN and Post -op Vital signs reviewed and stable  Post vital signs: Reviewed and stable  Last Vitals:  Vitals Value Taken Time  BP    Temp    Pulse 64 01/15/2018  4:43 PM  Resp 18 01/15/2018  4:43 PM  SpO2 100 % 01/15/2018  4:43 PM  Vitals shown include unvalidated device data.  Last Pain:  Vitals:   01/15/18 1600  TempSrc: Tympanic  PainSc: 0-No pain      Patients Stated Pain Goal: 0 (01/15/18 1325)  Complications: No apparent anesthesia complications

## 2018-01-15 NOTE — Anesthesia Postprocedure Evaluation (Signed)
Anesthesia Post Note  Patient: Hassell DoneKarly C Williams Salser  Procedure(s) Performed: ENDOSCOPIC RETROGRADE CHOLANGIOPANCREATOGRAPHY (ERCP) WITH PROPOFOL (N/A )  Patient location during evaluation: Endoscopy Anesthesia Type: General Level of consciousness: awake and alert Pain management: pain level controlled Vital Signs Assessment: post-procedure vital signs reviewed and stable Respiratory status: spontaneous breathing, nonlabored ventilation, respiratory function stable and patient connected to nasal cannula oxygen Cardiovascular status: blood pressure returned to baseline and stable Postop Assessment: no apparent nausea or vomiting Anesthetic complications: no     Last Vitals:  Vitals:   01/15/18 1653 01/15/18 1703  BP: 100/78   Pulse: 62   Resp: 16 16  Temp:    SpO2:      Last Pain:  Vitals:   01/15/18 1703  TempSrc:   PainSc: 0-No pain                 Jalisia Puchalski S

## 2018-01-15 NOTE — Anesthesia Post-op Follow-up Note (Signed)
Anesthesia QCDR form completed.        

## 2018-01-15 NOTE — Anesthesia Preprocedure Evaluation (Signed)
Anesthesia Evaluation  Patient identified by MRN, date of birth, ID band Patient awake    Reviewed: Allergy & Precautions, H&P , NPO status , reviewed documented beta blocker date and time   Airway Mallampati: II  TM Distance: >3 FB Neck ROM: full    Dental  (+) Poor Dentition   Pulmonary Current Smoker,    Pulmonary exam normal        Cardiovascular negative cardio ROS Normal cardiovascular exam     Neuro/Psych  Headaches, PSYCHIATRIC DISORDERS Anxiety Depression    GI/Hepatic negative GI ROS,   Endo/Other  negative endocrine ROS  Renal/GU negative Renal ROS  negative genitourinary   Musculoskeletal   Abdominal   Peds  Hematology negative hematology ROS (+)   Anesthesia Other Findings   Reproductive/Obstetrics                             Anesthesia Physical Anesthesia Plan  ASA: II  Anesthesia Plan: General   Post-op Pain Management:    Induction:   PONV Risk Score and Plan: 2 and Propofol infusion  Airway Management Planned:   Additional Equipment:   Intra-op Plan:   Post-operative Plan:   Informed Consent: I have reviewed the patients History and Physical, chart, labs and discussed the procedure including the risks, benefits and alternatives for the proposed anesthesia with the patient or authorized representative who has indicated his/her understanding and acceptance.   Dental Advisory Given  Plan Discussed with: CRNA  Anesthesia Plan Comments:         Anesthesia Quick Evaluation

## 2018-01-15 NOTE — ED Notes (Signed)
Dr. Patel at bedside 

## 2018-01-15 NOTE — ED Triage Notes (Signed)
Patient presents to ED via POV from with c/o back pain and abdominal pain x 5 months "on and off since I had my daughter". Patient reports N/V, denies diarrhea. Denies dysuria. Patient tachycardic in triage.

## 2018-01-15 NOTE — ED Notes (Signed)
Urine sent to lab if needed UA specimen

## 2018-01-15 NOTE — H&P (Signed)
[facility] [place] at [facility]   PATIENT NAME: Lacey Bentley    MR#:  [id number]  DATE OF BIRTH:  10-17-1990  DATE OF ADMISSION:  01/15/2018  PRIMARY CARE PHYSICIAN: Patient, No Pcp Per   REQUESTING/REFERRING PHYSICIAN: Dr. Cyril Loosen  CHIEF COMPLAINT:   Abdominal pain on and off for couple months HISTORY OF PRESENT ILLNESS:  Lacey Bentley  is a 27 y.o. female with a known history of anxiety/depression not on any medication comes to the emergency room with complaints of abdominal pain intermittent on and off radiating to the back. Pain is around the epigastric area. Denies any nausea or vomiting. Workup in the ER showed patient has gallstones and possible stone in the cystic duct. She has elevated LFTs and bilirubin. She's being admitted for further fashion management. G.I. is planning on doing ERCP this morning.  PAST MEDICAL HISTORY:   Past Medical History:  Diagnosis Date  . Anxiety   . Depression   . History of preterm delivery    DELIVERED 5 WKS EARLY    PAST SURGICAL HISTOIRY:   Past Surgical History:  Procedure Laterality Date  . NO PAST SURGERIES      SOCIAL HISTORY:   Social History   Tobacco Use  . Smoking status: Current Every Day Smoker    Packs/day: 1.00    Years: 2.00    Pack years: 2.00  . Smokeless tobacco: Never Used  Substance Use Topics  . Alcohol use: No    FAMILY HISTORY:   Family History  Problem Relation Age of Onset  . Hypertension Father     DRUG ALLERGIES:   Allergies  Allergen Reactions  . Sunscreens Rash    REVIEW OF SYSTEMS:  Review of Systems  Constitutional: Negative for chills, fever and weight loss.  HENT: Negative for ear discharge, ear pain and nosebleeds.   Eyes: Negative for blurred vision, pain and discharge.  Respiratory: Negative for sputum production, shortness of breath, wheezing and stridor.   Cardiovascular: Negative for chest pain, palpitations, orthopnea and  PND.  Gastrointestinal: Positive for abdominal pain. Negative for diarrhea, nausea and vomiting.  Genitourinary: Negative for frequency and urgency.  Musculoskeletal: Negative for back pain and joint pain.  Neurological: Negative for sensory change, speech change, focal weakness and weakness.  Psychiatric/Behavioral: Negative for depression and hallucinations. The patient is not nervous/anxious.      MEDICATIONS AT HOME:   Prior to Admission medications   Not on File      VITAL SIGNS:  Blood pressure 104/71, pulse (!) 59, temperature 98.3 F (36.8 C), temperature source Oral, resp. rate 12, height 5\' 2"  (1.575 m), weight 69.4 kg (153 lb), last menstrual period 01/15/2018, SpO2 100 %, unknown if currently breastfeeding.  PHYSICAL EXAMINATION:  GENERAL:  27 y.o.-year-old patient lying in the bed with no acute distress.  EYES: Pupils equal, round, reactive to light and accommodation. No scleral icterus. Extraocular muscles intact.  HEENT: Head atraumatic, normocephalic. Oropharynx and nasopharynx clear.  NECK:  Supple, no jugular venous distention. No thyroid enlargement, no tenderness.  LUNGS: Normal breath sounds bilaterally, no wheezing, rales,rhonchi or crepitation. No use of accessory muscles of respiration.  CARDIOVASCULAR: S1, S2 normal. No murmurs, rubs, or gallops.  ABDOMEN: Soft, nontender, nondistended. Bowel sounds present. No organomegaly or mass.  EXTREMITIES: No pedal edema, cyanosis, or clubbing.  NEUROLOGIC: Cranial nerves II through XII are intact. Muscle strength 5/5 in all extremities. Sensation intact. Gait not checked.  PSYCHIATRIC: The patient is  alert and oriented x 3.  SKIN: No obvious rash, lesion, or ulcer.   LABORATORY PANEL:   CBC Recent Labs  Lab 01/15/18 0802  WBC 11.6*  HGB 13.7  HCT 40.0  PLT 278   ------------------------------------------------------------------------------------------------------------------  Chemistries  Recent Labs   Lab 01/15/18 0802  NA 140  K 3.8  CL 107  CO2 25  GLUCOSE 113*  BUN 8  CREATININE 0.69  CALCIUM 9.4  AST 300*  ALT 489*  ALKPHOS 160*  BILITOT 2.9*   ------------------------------------------------------------------------------------------------------------------  Cardiac Enzymes No results for input(s): TROPONINI in the last 168 hours. ------------------------------------------------------------------------------------------------------------------  RADIOLOGY:  [place] Abdomen Limited Ruq  Result Date: 01/15/2018 CLINICAL DATA:  Chronic upper abdominal pain. EXAM: ULTRASOUND ABDOMEN LIMITED RIGHT UPPER QUADRANT COMPARISON:  None. FINDINGS: Gallbladder: Cholelithiasis is noted with associated sludge within gallbladder lumen. Mild gallbladder wall thickening is noted without pericholecystic fluid. Presence or absence of sonographic Murphy's sign was not reported by technologist. Stones may be present within the cystic duct. Common bile duct: Diameter: 11 mm distally which is dilated. Liver: No focal lesion identified. Within normal limits in parenchymal echogenicity. Portal vein is patent on color Doppler imaging with normal direction of blood flow towards the liver. IMPRESSION: Cholelithiasis and sludge is noted within gallbladder lumen. Mild gallbladder wall thickening is noted, although no pericholecystic fluid is noted. Common bile duct is dilated, and correlation with liver function tests is recommended to evaluate for distal common bile duct obstruction. MRCP may be performed for further evaluation as well. Electronically Signed   By: Lupita RaiderJames  Green Jr, M.D.   On: 01/15/2018 10:13    EKG:    IMPRESSION AND PLAN:   Lacey Bentley  is a 27 y.o. female with a known history of anxiety/depression not on any medication comes to the emergency room with complaints of abdominal pain intermittent on and off radiating to the back. Pain is around the epigastric area.  1. acute  cholelithiasis and possible choledcholithiasis -admit to surgical floor -NPO -IV fluids PRN pain meds- -G.I. Consultation -surgical consultation  2. DVT prophylaxis SCD teds  All the records are reviewed and case discussed with ED provider. Management plans discussed with the patient, family and they are in agreement.  CODE STATUS: full  TOTAL TIME TAKING CARE OF THIS PATIENT: 45* minutes.    Enedina FinnerSona Kyren Vaux M.D on 01/15/2018 at 11:10 AM  Between 7am to 6pm - Pager - [phone]  After 6pm go to [email or web address] - password [email or web address] [facility]  [facility]  Office  [phone]  CC: Primary care physician; Patient, No Pcp Per

## 2018-01-15 NOTE — Anesthesia Procedure Notes (Signed)
Date/Time: 01/15/2018 4:00 PM Performed by: Henrietta HooverPope, Keywon Mestre, CRNA Pre-anesthesia Checklist: Patient identified, Emergency Drugs available, Suction available, Patient being monitored and Timeout performed Patient Re-evaluated:Patient Re-evaluated prior to induction Oxygen Delivery Method: Nasal cannula Placement Confirmation: positive ETCO2

## 2018-01-15 NOTE — Op Note (Signed)
[facility] Patient Name: Lacey Bentley Procedure Date: 01/15/2018 4:07 PM MRN: [id number] Account #: [id number] Date of Birth: 06/30/1991 Admit Type: Outpatient Age: 27 [facility] Gender: Female Note Status: Finalized Procedure:            ERCP Indications:          Common bile duct stone(s), Jaundice, Elevated liver                        enzymes Providers:            Midge Miniumarren Yosselyn Tax MD, MD Referring MD:         No Local Md, MD (Referring MD) Medicines:            Propofol per Anesthesia Complications:        No immediate complications. Procedure:            Pre-Anesthesia Assessment:                       - Prior to the procedure, a History and Physical was                        performed, and patient medications and allergies were                        reviewed. The patient's tolerance of previous                        anesthesia was also reviewed. The risks and benefits of                        the procedure and the sedation options and risks were                        discussed with the patient. All questions were                        answered, and informed consent was obtained. Prior                        Anticoagulants: The patient has taken no previous                        anticoagulant or antiplatelet agents. ASA Grade                        Assessment: I - A normal, healthy patient. After                        reviewing the risks and benefits, the patient was                        deemed in satisfactory condition to undergo the                        procedure.                       After obtaining informed consent, the scope was passed  under direct vision. Throughout the procedure, the                        patient's blood pressure, pulse, and oxygen saturations                        were monitored continuously. The Endoscope was                        introduced through the mouth,  and used to inject                        contrast into and used to inject contrast into the bile                        duct. The ERCP was accomplished without difficulty. The                        patient tolerated the procedure well. Findings:      The scout film was normal. The esophagus was successfully intubated       under direct vision. The scope was advanced to a normal major papilla in       the descending duodenum without detailed examination of the pharynx,       larynx and associated structures, and upper GI tract. The upper GI tract       was grossly normal. The bile duct was deeply cannulated with the       short-nosed traction sphincterotome. Contrast was injected. I personally       interpreted the bile duct images. There was brisk flow of contrast       through the ducts. Image quality was excellent. Contrast extended to the       entire biliary tree. The lower third of the main bile duct contained one       stone mm. A wire was passed into the biliary tree. Biliary       sphincterotomy was made with a traction (standard) sphincterotome using       ERBE electrocautery. There was no post-sphincterotomy bleeding. The       biliary tree was swept with a 15 mm balloon starting at the bifurcation.       One stone was removed. No stones remained. Impression:           - Choledocholithiasis was found. Complete removal was                        accomplished by biliary sphincterotomy and balloon                        extraction.                       - A biliary sphincterotomy was performed.                       - The biliary tree was swept. Recommendation:       - Return patient to hospital ward for ongoing care.                       - Clear liquid diet. Procedure Code(s):    --- Professional ---  [id number], Endoscopic retrograde cholangiopancreatography                        (ERCP); with removal of calculi/debris from                         biliary/pancreatic duct(s)                       43262, Endoscopic retrograde cholangiopancreatography                        (ERCP); with sphincterotomy/papillotomy                       [phone], Endoscopic catheterization of the biliary ductal                        system, radiological supervision and interpretation Diagnosis Code(s):    --- Professional ---                       R17, Unspecified jaundice                       R74.8, Abnormal levels of other serum enzymes                       K80.50, Calculus of bile duct without cholangitis or                        cholecystitis without obstruction CPT copyright 2017 [employer]. All rights reserved. The codes documented in this report are preliminary and upon coder review may  be revised to meet current compliance requirements. Midge Minium MD, MD 01/15/2018 4:38:10 PM This report has been signed electronically. Number of Addenda: 0 Note Initiated On: 01/15/2018 4:07 PM      [facility]

## 2018-01-15 NOTE — ED Notes (Signed)
[facility]

## 2018-01-15 NOTE — ED Notes (Signed)
Pt reports that she has intermittent back pain since having a baby in October. States she has been vomiting for 2 days (1 x per day) when she has the sensation of the back pain. Pt denies any nausea or diarrhea. States back pain has been more intense and frequent in last couple of days as well; reports that her back does not hurt at this time, but her ribcage hurts. Pt alert & oriented with NAD noted.

## 2018-01-16 ENCOUNTER — Encounter: Payer: Self-pay | Admitting: Gastroenterology

## 2018-01-16 DIAGNOSIS — K8070 Calculus of gallbladder and bile duct without cholecystitis without obstruction: Secondary | ICD-10-CM

## 2018-01-16 LAB — COMPREHENSIVE METABOLIC PANEL
ALK PHOS: 118 U/L (ref 38–126)
ALT: 270 U/L — ABNORMAL HIGH (ref 14–54)
AST: 95 U/L — AB (ref 15–41)
Albumin: 3.4 g/dL — ABNORMAL LOW (ref 3.5–5.0)
Anion gap: 6 (ref 5–15)
BILIRUBIN TOTAL: 1.3 mg/dL — AB (ref 0.3–1.2)
BUN: 8 mg/dL (ref 6–20)
CO2: 24 mmol/L (ref 22–32)
Calcium: 8.6 mg/dL — ABNORMAL LOW (ref 8.9–10.3)
Chloride: 110 mmol/L (ref 101–111)
Creatinine, Ser: 0.6 mg/dL (ref 0.44–1.00)
GFR calc Af Amer: >60 mL/min (ref >60)
GFR calc non Af Amer: >60 mL/min (ref >60)
Glucose, Bld: 106 mg/dL — ABNORMAL HIGH (ref 65–99)
POTASSIUM: 3.7 mmol/L (ref 3.5–5.1)
SODIUM: 140 mmol/L (ref 135–145)
TOTAL PROTEIN: 6.1 g/dL — AB (ref 6.5–8.1)

## 2018-01-16 MED ORDER — SODIUM CHLORIDE 0.9 % IV BOLUS
1000.0000 mL | INTRAVENOUS | Status: DC | PRN
Start: 2018-01-16 — End: 2018-01-16
  Administered 2018-01-16: 1000 mL via INTRAVENOUS

## 2018-01-16 MED ORDER — ACETAMINOPHEN 325 MG PO TABS: 325.0000 mg | ORAL_TABLET | Freq: Four times a day (QID) | ORAL | Status: DC | PRN

## 2018-01-16 NOTE — Discharge Summary (Signed)
[facility] - [place] at [facility]   PATIENT NAME: Lacey Bentley    MR#:  [id number]  DATE OF BIRTH:  04-30-1991  DATE OF ADMISSION:  01/15/2018 ADMITTING PHYSICIAN: Enedina Finner, MD  DATE OF DISCHARGE:  01/16/18 PRIMARY CARE PHYSICIAN: Patient, No Pcp Per    ADMISSION DIAGNOSIS:  Choledocholithiasis [K80.50] Upper abdominal pain [R10.10]  DISCHARGE DIAGNOSIS:  Active Problems:   Abdominal pain   Choledocholithiasis   Jaundice   Elevated liver enzymes   SECONDARY DIAGNOSIS:   Past Medical History:  Diagnosis Date  . Anxiety   . Depression   . Headache   . History of preterm delivery    DELIVERED 5 WKS EARLY    HOSPITAL COURSE:   HPI Lacey Bentley  is a 27 y.o. female with a known history of anxiety/depression not on any medication comes to the emergency room with complaints of abdominal pain intermittent on and off radiating to the back. Pain is around the epigastric area. Denies any nausea or vomiting. Workup in the ER showed patient has gallstones and possible stone in the cystic duct. She has elevated LFTs and bilirubin. She's being admitted for further fashion management.   1. acute cholelithiasis and possible choledcholithiasis Patient was initially kept n.p.o., IV fluids provided and pain management was given as needed patient was seen by GI, ERCP done may have removed calculi and did a sphincterotomy patient tolerated procedure well doing fine tolerating diet. Denies any pain and wants to go home  surgery is recommending cholecystectomy tomorrow but patient is refusing she wants to follow-up with them as an outpatient and get the cholecystectomy done eventually in the next 1-2 weeks.   GI signed off, surgery is agreeable for the patient to follow-up with them as an outpatient Discharge patient home   2.  Hypotension significantly improved with IV fluids  DVT prophylaxis SCD teds    DISCHARGE CONDITIONS:    stable  CONSULTS OBTAINED:  Treatment Team:  Carolan Shiver, MD Stanton Kidney, MD   PROCEDURES   Endoscopic retrograde cholangiopancreatography (ERCP); with removal of calculi/debris from biliary/pancreatic duct(s); with sphincterotomy/papillotomy  DRUG ALLERGIES:   Allergies  Allergen Reactions  . Sunscreens Rash    DISCHARGE MEDICATIONS:   Allergies as of 01/16/2018      Reactions   Sunscreens Rash      Medication List    TAKE these medications   acetaminophen 325 MG tablet Commonly known as:  TYLENOL Take 1 tablet (325 mg total) by mouth every 6 (six) hours as needed for mild pain, moderate pain, fever or headache (or Fever >/= 101).        DISCHARGE INSTRUCTIONS:   Follow-up with primary care physician in 1 week Follow-up with gastroenterology as needed Follow-up with [place] surgery in 1 week or sooner as needed  DIET:  Low-fat  DISCHARGE CONDITION:  STABLE   ACTIVITY:  Activity as tolerated  OXYGEN:  Home Oxygen: No.   Oxygen Delivery: room air  DISCHARGE LOCATION:  home   If you experience worsening of your admission symptoms, develop shortness of breath, life threatening emergency, suicidal or homicidal thoughts you must seek medical attention immediately by calling 911 or calling your MD immediately  if symptoms less severe.  You Must read complete instructions/literature along with all the possible adverse reactions/side effects for all the Medicines you take and that have been prescribed to you. Take any new Medicines after you have completely understood and accpet all the possible  adverse reactions/side effects.   Please note  You were cared for by a hospitalist during your hospital stay. If you have any questions about your discharge medications or the care you received while you were in the hospital after you are discharged, you can call the unit and asked to speak with the hospitalist on call if the hospitalist that took  care of you is not available. Once you are discharged, your primary care physician will handle any further medical issues. Please note that NO REFILLS for any discharge medications will be authorized once you are discharged, as it is imperative that you return to your primary care physician (or establish a relationship with a primary care physician if you do not have one) for your aftercare needs so that they can reassess your need for medications and monitor your lab values.     Today  Chief Complaint  Patient presents with  . Abdominal Pain   Patient denies any abdominal pain tolerated diet denies any nausea vomiting.  Desperately wanted to go home to take care of her children will discharge patient home GI signed off.  Surgery is agreeable to discharge patient recommending outpatient follow-up with [facility]  ROS:  CONSTITUTIONAL: Denies fevers, chills. Denies any fatigue, weakness.  EYES: Denies blurry vision, double vision, eye pain. EARS, NOSE, THROAT: Denies tinnitus, ear pain, hearing loss. RESPIRATORY: Denies cough, wheeze, shortness of breath.  CARDIOVASCULAR: Denies chest pain, palpitations, edema.  GASTROINTESTINAL: Denies nausea, vomiting, diarrhea, abdominal pain. Denies bright red blood per rectum. GENITOURINARY: Denies dysuria, hematuria. ENDOCRINE: Denies nocturia or thyroid problems. HEMATOLOGIC AND LYMPHATIC: Denies easy bruising or bleeding. SKIN: Denies rash or lesion. MUSCULOSKELETAL: Denies pain in neck, back, shoulder, knees, hips or arthritic symptoms.  NEUROLOGIC: Denies paralysis, paresthesias.  PSYCHIATRIC: Denies anxiety or depressive symptoms.   VITAL SIGNS:  Blood pressure 102/69, pulse 68, temperature 98.2 F (36.8 C), temperature source Oral, resp. rate 20, height 5\' 2"  (1.575 m), weight 69.4 kg (153 lb), last menstrual period 01/15/2018, SpO2 100 %, unknown if currently breastfeeding.  I/O:    Intake/Output Summary (Last 24 hours) at  01/16/2018 1403 Last data filed at 01/16/2018 1050 Gross per 24 hour  Intake 1538.75 ml  Output 500 ml  Net 1038.75 ml    PHYSICAL EXAMINATION:  GENERAL:  27 y.o.-year-old patient lying in the bed with no acute distress.  EYES: Pupils equal, round, reactive to light and accommodation. No scleral icterus. Extraocular muscles intact.  HEENT: Head atraumatic, normocephalic. Oropharynx and nasopharynx clear.  NECK:  Supple, no jugular venous distention. No thyroid enlargement, no tenderness.  LUNGS: Normal breath sounds bilaterally, no wheezing, rales,rhonchi or crepitation. No use of accessory muscles of respiration.  CARDIOVASCULAR: S1, S2 normal. No murmurs, rubs, or gallops.  ABDOMEN: Soft, non-tender, non-distended. Bowel sounds present. No organomegaly or mass.  EXTREMITIES: No pedal edema, cyanosis, or clubbing.  NEUROLOGIC: Cranial nerves II through XII are intact. Muscle strength 5/5 in all extremities. Sensation intact. Gait not checked.  PSYCHIATRIC: The patient is alert and oriented x 3.  SKIN: No obvious rash, lesion, or ulcer.   DATA REVIEW:   CBC Recent Labs  Lab 01/15/18 0802  WBC 11.6*  HGB 13.7  HCT 40.0  PLT 278    Chemistries  Recent Labs  Lab 01/16/18 0557  NA 140  K 3.7  CL 110  CO2 24  GLUCOSE 106*  BUN 8  CREATININE 0.60  CALCIUM 8.6*  AST 95*  ALT 270*  ALKPHOS 118  BILITOT 1.3*    Cardiac Enzymes No results for input(s): TROPONINI in the last 168 hours.  Microbiology Results  Results for orders placed or performed in visit on 07/23/17  Strep Gp B NAA     Status: None   Collection Time: 07/23/17 11:55 AM  Result Value Ref Range Status   Strep Gp B NAA Negative Negative Final    Comment: Centers for Disease Control and Prevention (CDC) and American Congress of Obstetricians and Gynecologists (ACOG) guidelines for prevention of perinatal group B streptococcal (GBS) disease specify co-collection of a vaginal and rectal swab specimen to  maximize sensitivity of GBS detection. Per the CDC and ACOG, swabbing both the lower vagina and rectum substantially increases the yield of detection compared with sampling the vagina alone. Penicillin G, ampicillin, or cefazolin are indicated for intrapartum prophylaxis of perinatal GBS colonization. Reflex susceptibility testing should be performed prior to use of clindamycin only on GBS isolates from penicillin-allergic women who are considered a high risk for anaphylaxis. Treatment with vancomycin without additional testing is warranted if resistance to clindamycin is noted.   GC/Chlamydia Probe Amp     Status: None   Collection Time: 07/23/17 11:57 AM  Result Value Ref Range Status   Chlamydia trachomatis, NAA Negative Negative Final   Neisseria gonorrhoeae by PCR Negative Negative Final    RADIOLOGY:  Dg C-arm 1-60 Min  Result Date: 01/15/2018 CLINICAL DATA:  ERCP. EXAM: DG C-ARM 61-120 MIN COMPARISON:  Abdominal ultrasound from same day. FINDINGS: Single intraoperative cholangiogram demonstrates mild dilatation of the distal common bile duct with a small filling defect near the major papilla. Normal caliber proper hepatic and intrahepatic biliary ducts. IMPRESSION: 1. Choledocholithiasis with mild dilatation of the distal common bile duct. FLUOROSCOPY TIME:  1 minutes, 43 seconds. C-arm fluoroscopic images were obtained intraoperatively and submitted for post operative interpretation. Electronically Signed   By: Obie DredgeWilliam T Derry M.D.   On: 01/15/2018 17:20   [place] Abdomen Limited Ruq  Result Date: 01/15/2018 CLINICAL DATA:  Chronic upper abdominal pain. EXAM: ULTRASOUND ABDOMEN LIMITED RIGHT UPPER QUADRANT COMPARISON:  None. FINDINGS: Gallbladder: Cholelithiasis is noted with associated sludge within gallbladder lumen. Mild gallbladder wall thickening is noted without pericholecystic fluid. Presence or absence of sonographic Murphy's sign was not reported by technologist. Stones may be  present within the cystic duct. Common bile duct: Diameter: 11 mm distally which is dilated. Liver: No focal lesion identified. Within normal limits in parenchymal echogenicity. Portal vein is patent on color Doppler imaging with normal direction of blood flow towards the liver. IMPRESSION: Cholelithiasis and sludge is noted within gallbladder lumen. Mild gallbladder wall thickening is noted, although no pericholecystic fluid is noted. Common bile duct is dilated, and correlation with liver function tests is recommended to evaluate for distal common bile duct obstruction. MRCP may be performed for further evaluation as well. Electronically Signed   By: Lupita RaiderJames  Green Jr, M.D.   On: 01/15/2018 10:13    EKG:  No orders found for this or any previous visit.    Management plans discussed with the patient, family and they are in agreement.  CODE STATUS:     Code Status Orders  (From admission, onward)        Start     Ordered   01/15/18 1801  Full code  Continuous     01/15/18 1800    Code Status History    Date Active Date Inactive Code Status Order ID Comments User Context   08/09/2017 2045 08/11/2017  0112 Full Code [id number]  Gunnar Bulla, CNM Inpatient   08/09/2017 0553 08/09/2017 2045 Full Code [id number]  Gunnar Bulla, CNM Inpatient   07/18/2015 1434 07/19/2015 2031 Full Code [id number]  Schermerhorn, Ihor Austin, MD Inpatient   07/18/2015 0350 07/18/2015 1433 Full Code [id number]  Kennith Gain, RN Inpatient   07/08/2015 2308 07/09/2015 0432 Full Code [id number]  Eugene Garnet, RN Inpatient      TOTAL TIME TAKING CARE OF THIS PATIENT: 42  minutes.   Note: This dictation was prepared with Dragon dictation along with smaller phrase technology. Any transcriptional errors that result from this process are unintentional.   @[facility] @  on 01/16/2018 at 2:03 PM  Between 7am to 6pm - Pager - [phone]  After 6pm go to [email or web address] - password [email or web address]  Fabio Neighbors  Hospitalists  Office  [phone]  CC: Primary care physician; Patient, No Pcp Per

## 2018-01-16 NOTE — Plan of Care (Signed)
Nutrition Education Note  RD educated patient today regarding a fat-restricted diet. Pt states she normally "eats healthy" and that her diet has improved over the past 6 months. Pt typically chooses low-fat options like baked chips over regular chips. Pt is aware of what foods contain fat. Pt does state that she normally doesn't eat all day until 5:00 pm. Discussed importance of small, frequent meals. Provided pt with some ideas.  RD provided "Fat-Restricted Nutrition Therapy" handout from the Academy of Nutrition and Dietetics. RD also provided a list of low fat vs high fat foods. Educated pt on the importance of adequate protein intake needed to preserve lean muscle. Gave patient examples of meals and menu planning.  Teach back method used.  Expect fair compliance.  Body mass index is 27.98 kg/m. Pt meets criteria for Overweight based on current BMI.  Current diet order is Heart Healthy, patient is consuming approximately 70% of meals at this time. Labs and medications reviewed. No further nutrition interventions warranted at this time. If additional nutrition issues arise, please re-consult RD.  Earma ReadingKate Jablonski Keondrick Dilks, MS, RD, LDN Pager: [phone] Weekend/After Hours: [phone]

## 2018-01-16 NOTE — Consult Note (Signed)
SURGICAL CONSULTATION NOTE   HISTORY OF PRESENT ILLNESS (HPI):  27 y.o. female [facility] ED for evaluation of abdominal pain. Patient reports has been having nausea and vomiting since October 2018 but last night it was more sever and felt that the abdomen was inflamed. Pain usually started on the back and radiated to the right upper quadrant but yesterday pain started on the epigastrium and radiated to the right upper quadrant and then to the back.  Nothing improved or aggravated the pain. Denies fever or chills. .  Surgery is consulted by Dr. Amado CoeGouru in this context for evaluation and management of choledocholitiasis.  PAST MEDICAL HISTORY (PMH):  Past Medical History:  Diagnosis Date  . Anxiety   . Depression   . Headache   . History of preterm delivery    DELIVERED 5 WKS EARLY     PAST SURGICAL HISTORY (PSH):  Past Surgical History:  Procedure Laterality Date  . ENDOSCOPIC RETROGRADE CHOLANGIOPANCREATOGRAPHY (ERCP) WITH PROPOFOL N/A 01/15/2018   Procedure: ENDOSCOPIC RETROGRADE CHOLANGIOPANCREATOGRAPHY (ERCP) WITH PROPOFOL;  Surgeon: Midge MiniumWohl, Darren, MD;  Location: [facility] ENDOSCOPY;  Service: Endoscopy;  Laterality: N/A;  . NO PAST SURGERIES       MEDICATIONS:  Prior to Admission medications   Not on File     ALLERGIES:  Allergies  Allergen Reactions  . Sunscreens Rash     SOCIAL HISTORY:  Social History   Socioeconomic History  . Marital status: Married    Spouse name: Not on file  . Number of children: Not on file  . Years of education: Not on file  . Highest education level: Not on file  Occupational History  . Not on file  Social Needs  . Financial resource strain: Not on file  . Food insecurity:    Worry: Not on file    Inability: Not on file  . Transportation needs:    Medical: Not on file    Non-medical: Not on file  Tobacco Use  . Smoking status: Current Every Day Smoker    Packs/day: 1.00    Years: 2.00    Pack years: 2.00  . Smokeless tobacco:  Never Used  Substance and Sexual Activity  . Alcohol use: No  . Drug use: No  . Sexual activity: Yes    Birth control/protection: None  Lifestyle  . Physical activity:    Days per week: Not on file    Minutes per session: Not on file  . Stress: Not on file  Relationships  . Social connections:    Talks on phone: Not on file    Gets together: Not on file    Attends religious service: Not on file    Active member of club or organization: Not on file    Attends meetings of clubs or organizations: Not on file    Relationship status: Not on file  . Intimate partner violence:    Fear of current or ex partner: Not on file    Emotionally abused: Not on file    Physically abused: Not on file    Forced sexual activity: Not on file  Other Topics Concern  . Not on file  Social History Narrative  . Not on file    The patient currently resides (home / rehab facility / nursing home): Home The patient normally is (ambulatory / bedbound): Ambulatory   FAMILY HISTORY:  Family History  Problem Relation Age of Onset  . Hypertension Father      REVIEW OF SYSTEMS:  Constitutional:  denies weight loss, fever, chills, or sweats  Eyes: denies any other vision changes, history of eye injury  ENT: denies sore throat, hearing problems  Respiratory: denies shortness of breath, wheezing  Cardiovascular: denies chest pain, palpitations  Gastrointestinal: positive for abdominal pain. see HPI Genitourinary: denies burning with urination or urinary frequency Musculoskeletal: denies any other joint pains or cramps  Skin: denies any other rashes or skin discolorations  Neurological: denies any other headache, dizziness, weakness  Psychiatric: denies any other depression, anxiety   All other review of systems were negative   VITAL SIGNS:  Temp:  [97.2 F (36.2 C)-98.3 F (36.8 C)] 97.8 F (36.6 C) (04/05 0451) Pulse Rate:  [45-63] 60 (04/05 0451) Resp:  [14-20] 20 (04/05 0451) BP:  (85-105)/(56-78) 85/56 (04/05 0451) SpO2:  [98 %-100 %] 100 % (04/05 0451)     Height: 5\' 2"  (157.5 cm) Weight: 69.4 kg (153 lb) BMI (Calculated): 27.98    PHYSICAL EXAM:  Constitutional:  -- Normal body habitus  -- Awake, alert, and oriented x3  Eyes:  -- Pupils equally round and reactive to light  -- No scleral icterus  Ear, nose, and throat:  -- No jugular venous distension  Pulmonary:  -- No crackles  -- Equal breath sounds bilaterally -- Breathing non-labored at rest Cardiovascular:  -- S1, S2 present  -- No pericardial rubs Gastrointestinal:  -- Abdomen soft, nontender, non-distended, no guarding or rebound tenderness -- No abdominal masses appreciated, pulsatile or otherwise  Extremity: -- no edema, no cyanosis.   Labs:  CBC Latest Ref Rng & Units 01/15/2018 08/10/2017 08/10/2017  WBC 3.6 - 11.0 K/uL 11.6(H) 12.3(H) 14.3(H)  Hemoglobin 12.0 - 16.0 g/dL 16.1 10.1(L) 7.1(L)  Hematocrit 35.0 - 47.0 % 40.0 28.0(L) 20.9(L)  Platelets 150 - 440 K/uL 278 200 179   CMP Latest Ref Rng & Units 01/16/2018 01/15/2018  Glucose 65 - 99 mg/dL 096(E) 454(U)  BUN 6 - 20 mg/dL 8 8  Creatinine 9.81 - 1.00 mg/dL 1.91 4.78  Sodium 295 - 145 mmol/L 140 140  Potassium 3.5 - 5.1 mmol/L 3.7 3.8  Chloride 101 - 111 mmol/L 110 107  CO2 22 - 32 mmol/L 24 25  Calcium 8.9 - 10.3 mg/dL 6.2(Z) 9.4  Total Protein 6.5 - 8.1 g/dL 6.1(L) 8.0  Total Bilirubin 0.3 - 1.2 mg/dL 3.0(Q) 2.9(H)  Alkaline Phos 38 - 126 U/L 118 160(H)  AST 15 - 41 U/L 95(H) 300(H)  ALT 14 - 54 U/L 270(H) 489(H)   Imaging studies:  ERCP images reviewed. Findings positive for choledocholithiasis. Stone removed.   Assessment/Plan:  27 y.o. female with choledocholithiasis. Status post ERCP with removal of stone. Today with decreasing bilirubin. Patient oriented about the need of cholecystectomy on this admission. Patient refers that he cannot wait until tommorow for surgery. Currently there are 4 other patient pending for  cholecystectomy and the next available OR spot is after 11PM. I oriented the patient that it is safer to do the surgery tomorrow during the day but he refuse. I oriented that if she want to have the surgery in this admission as recommended has to be tomorrow, if not has to be discharged against the recommendations and seen as outpatient with the [facility].   All of the above findings and recommendations were discussed with the patient and her questions were answered to her expressed satisfaction.  Gae Gallop, MD

## 2018-01-16 NOTE — Discharge Instructions (Signed)
Follow-up with primary care physician in 1 week Follow-up with gastroenterology as needed Follow-up with [place] surgery in 1 week or sooner as needed

## 2018-01-19 ENCOUNTER — Telehealth: Payer: Self-pay | Admitting: Surgery

## 2018-01-19 NOTE — Telephone Encounter (Signed)
-----   Message from Ancil LinseyJason Evan Davis, MD sent at 01/16/2018 10:05 PM EDT ----- Regarding: Can you please schedule this patient an appointment with me? She needs lap chole. Hi Kelly,  This patient should have had her gallbladder removed prior to hospital discharge, but she chose instead to leave AMA today instead of waiting for surgery tomorrow. Can you please schedule her for a follow-up appointment with me, so we can schedule her lap chole?  Thank you!          - Barbara CowerJason

## 2018-01-19 NOTE — Telephone Encounter (Signed)
Spoke with the patient said she would need to call back and make an appointment due to patient no having a babysitter.

## 2018-01-22 NOTE — Telephone Encounter (Signed)
Left a message for the patient to call the office. °

## 2018-01-28 ENCOUNTER — Ambulatory Visit: Payer: Self-pay | Admitting: Surgery

## 2018-02-03 NOTE — Telephone Encounter (Signed)
Spoke with the patient said she would have to look at her schedule and would call back.

## 2018-02-17 NOTE — Telephone Encounter (Signed)
Left a message for the patient to call the office. °

## 2018-04-29 ENCOUNTER — Other Ambulatory Visit: Payer: Self-pay

## 2018-04-29 ENCOUNTER — Encounter: Payer: Self-pay | Admitting: Emergency Medicine

## 2018-04-29 ENCOUNTER — Emergency Department
Admission: EM | Admit: 2018-04-29 | Discharge: 2018-04-29 | Disposition: A | Discharge status: Home / Self Care | Payer: Medicaid Other | Attending: Emergency Medicine | Admitting: Emergency Medicine

## 2018-04-29 DIAGNOSIS — Z5321 Procedure and treatment not carried out due to patient leaving prior to being seen by health care provider: Secondary | ICD-10-CM | POA: Diagnosis not present

## 2018-04-29 DIAGNOSIS — M549 Dorsalgia, unspecified: Secondary | ICD-10-CM | POA: Diagnosis present

## 2018-04-29 DIAGNOSIS — R0789 Other chest pain: Secondary | ICD-10-CM | POA: Diagnosis not present

## 2018-04-29 LAB — CBC
HCT: 37.8 % (ref 35.0–47.0)
Hemoglobin: 12.8 g/dL (ref 12.0–16.0)
MCH: 30 pg (ref 26.0–34.0)
MCHC: 33.9 g/dL (ref 32.0–36.0)
MCV: 88.4 fL (ref 80.0–100.0)
PLATELETS: 297 10*3/uL (ref 150–440)
RBC: 4.28 MIL/uL (ref 3.80–5.20)
RDW: 14.7 % — AB (ref 11.5–14.5)
WBC: 12.4 10*3/uL — AB (ref 3.6–11.0)

## 2018-04-29 LAB — URINALYSIS, COMPLETE (UACMP) WITH MICROSCOPIC
Bacteria, UA: NONE SEEN
Bilirubin Urine: NEGATIVE
Glucose, UA: NEGATIVE mg/dL
Hgb urine dipstick: NEGATIVE
Ketones, ur: NEGATIVE mg/dL
Leukocytes, UA: NEGATIVE
NITRITE: NEGATIVE
PH: 8 (ref 5.0–8.0)
Protein, ur: NEGATIVE mg/dL
SPECIFIC GRAVITY, URINE: 1.009 (ref 1.005–1.030)
WBC, UA: NONE SEEN WBC/hpf (ref 0–5)

## 2018-04-29 LAB — COMPREHENSIVE METABOLIC PANEL
ALT: 15 U/L (ref 0–44)
ANION GAP: 7 (ref 5–15)
AST: 17 U/L (ref 15–41)
Albumin: 4.3 g/dL (ref 3.5–5.0)
Alkaline Phosphatase: 64 U/L (ref 38–126)
BILIRUBIN TOTAL: 0.3 mg/dL (ref 0.3–1.2)
BUN: 7 mg/dL (ref 6–20)
CO2: 27 mmol/L (ref 22–32)
Calcium: 9.6 mg/dL (ref 8.9–10.3)
Chloride: 105 mmol/L (ref 98–111)
Creatinine, Ser: 0.79 mg/dL (ref 0.44–1.00)
GFR calc Af Amer: >60 mL/min (ref >60)
Glucose, Bld: 91 mg/dL (ref 70–99)
POTASSIUM: 3.7 mmol/L (ref 3.5–5.1)
Sodium: 139 mmol/L (ref 135–145)
TOTAL PROTEIN: 7.7 g/dL (ref 6.5–8.1)

## 2018-04-29 LAB — POCT PREGNANCY, URINE: Preg Test, Ur: NEGATIVE

## 2018-04-29 LAB — LIPASE, BLOOD: Lipase: 49 U/L (ref 11–51)

## 2018-04-29 NOTE — ED Triage Notes (Signed)
Patient with pain to back and right ribs. Patient reports history of gallstones and was supposed to follow-up outpatient with surgery but has been unable to. States for the last 2 days pain and bloating has been worsening with no relief from OTC meds or home remedies.

## 2018-04-29 NOTE — ED Notes (Signed)
No answer when called several times from lobby 

## 2018-05-12 ENCOUNTER — Ambulatory Visit: Payer: Medicaid Other | Admitting: Surgery

## 2018-05-19 ENCOUNTER — Telehealth: Payer: Self-pay | Admitting: General Practice

## 2018-05-19 NOTE — Telephone Encounter (Signed)
Spoke with the patient, patient has decided not to schedule at this time, patient said she hasn't had any issues, but would call back if any other issues.

## 2018-07-08 ENCOUNTER — Encounter: Payer: Self-pay | Admitting: *Deleted

## 2019-04-15 ENCOUNTER — Telehealth: Payer: Self-pay

## 2019-04-15 NOTE — Telephone Encounter (Signed)
Coronavirus (COVID-19) Are you at risk?  Are you at risk for the Coronavirus (COVID-19)?  To be considered HIGH RISK for Coronavirus (COVID-19), you have to meet the following criteria:  . Traveled to [place], [place], [place], [place] or [place]; or in the [place] to [place], [place], [place], or [place]; and have fever, cough, and shortness of breath within the last 2 weeks of travel OR . Been in close contact with a person diagnosed with COVID-19 within the last 2 weeks and have fever, cough, and shortness of breath . IF YOU DO NOT MEET THESE CRITERIA, YOU ARE CONSIDERED LOW RISK FOR COVID-19.  What to do if you are HIGH RISK for COVID-19?  . If you are having a medical emergency, call 911. . Seek medical care right away. Before you go to a doctor's office, urgent care or emergency department, call ahead and tell them about your recent travel, contact with someone diagnosed with COVID-19, and your symptoms. You should receive instructions from your physician's office regarding next steps of care.  . When you arrive at healthcare provider, tell the healthcare staff immediately you have returned from visiting [place], [place], [place], [place] or [place]; or traveled in the [place] to [place], [place], [place], or [place]; in the last two weeks or you have been in close contact with a person diagnosed with COVID-19 in the last 2 weeks.   . Tell the health care staff about your symptoms: fever, cough and shortness of breath. . After you have been seen by a medical provider, you will be either: o Tested for (COVID-19) and discharged home on quarantine except to seek medical care if symptoms worsen, and asked to  - Stay home and avoid contact with others until you get your results (4-5 days)  - Avoid travel on public transportation if possible (such as bus, train, or airplane) or o Sent to the Emergency Department by EMS for evaluation, COVID-19 testing, and possible  admission depending on your condition and test results.  What to do if you are LOW RISK for COVID-19?  Reduce your risk of any infection by using the same precautions used for avoiding the common cold or flu:  . Wash your hands often with soap and warm water for at least 20 seconds.  If soap and water are not readily available, use an alcohol-based hand sanitizer with at least 60% alcohol.  . If coughing or sneezing, cover your mouth and nose by coughing or sneezing into the elbow areas of your shirt or coat, into a tissue or into your sleeve (not your hands). . Avoid shaking hands with others and consider head nods or verbal greetings only. . Avoid touching your eyes, nose, or mouth with unwashed hands.  . Avoid close contact with people who are sick. . Avoid places or events with large numbers of people in one location, like concerts or sporting events. . Carefully consider travel plans you have or are making. . If you are planning any travel outside or inside the [place], visit the CDC's Travelers' Health webpage for the latest health notices. . If you have some symptoms but not all symptoms, continue to monitor at home and seek medical attention if your symptoms worsen. . If you are having a medical emergency, call 911.   ADDITIONAL HEALTHCARE OPTIONS FOR PATIENTS  [facility] / e-Visit: [email or web address]         [facility]: [phone]  Onset   Urgent Care: [facility]: [phone]   Pre-screen negative, DM.  Patient c/o shortness of breath when she smokes.

## 2019-04-19 ENCOUNTER — Encounter: Payer: Medicaid Other | Admitting: Certified Nurse Midwife

## 2019-04-30 IMAGING — CR DG C-ARM 61-120 MIN
1 series · 1 of 1 positions shown · non-contrast
Comparison: Abdominal ultrasound from same day.

CLINICAL DATA: ERCP.

EXAM:
DG C-ARM 61-120 MIN

[cont.]
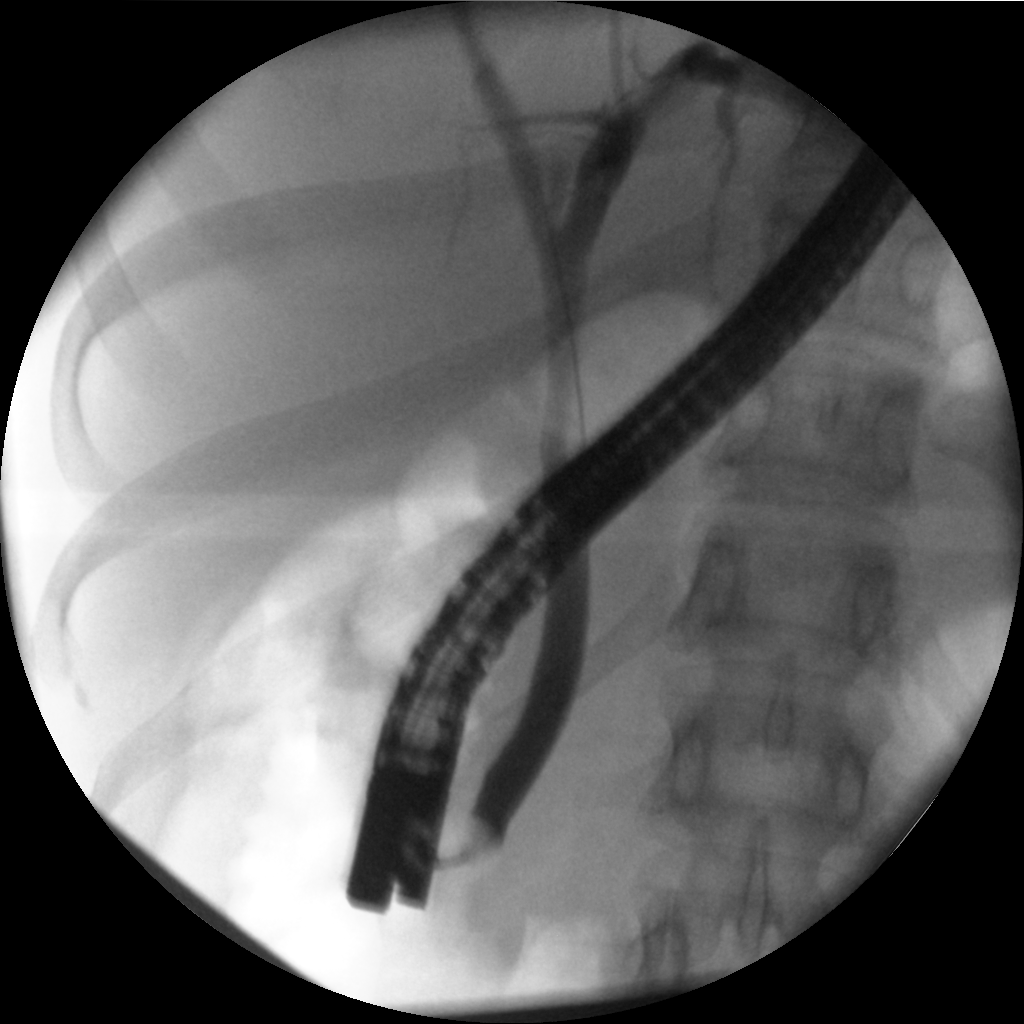

[1 of 1 positions shown; findings below may reference images not displayed]

FINDINGS: Single intraoperative cholangiogram demonstrates mild dilatation of
the distal common bile duct with a small filling defect near the
major papilla. Normal caliber proper hepatic and intrahepatic
biliary ducts.
IMPRESSION: 1. Choledocholithiasis with mild dilatation of the distal common
bile duct.

FLUOROSCOPY TIME:  1 minutes, 43 seconds.

C-arm fluoroscopic images were obtained intraoperatively and
submitted for post operative interpretation.

## 2020-04-13 DIAGNOSIS — Z419 Encounter for procedure for purposes other than remedying health state, unspecified: Secondary | ICD-10-CM | POA: Diagnosis not present

## 2020-05-14 DIAGNOSIS — Z419 Encounter for procedure for purposes other than remedying health state, unspecified: Secondary | ICD-10-CM | POA: Diagnosis not present

## 2020-05-24 ENCOUNTER — Encounter: Payer: Self-pay | Admitting: Obstetrics and Gynecology

## 2020-05-24 ENCOUNTER — Ambulatory Visit (INDEPENDENT_AMBULATORY_CARE_PROVIDER_SITE_OTHER): Payer: Medicaid Other | Admitting: Obstetrics and Gynecology

## 2020-05-24 VITALS — BP 112/77 | HR 118 | Ht 62.0 in | Wt 121.4 lb

## 2020-05-24 DIAGNOSIS — Z641 Problems related to multiparity: Secondary | ICD-10-CM

## 2020-05-24 DIAGNOSIS — N912 Amenorrhea, unspecified: Secondary | ICD-10-CM

## 2020-05-24 DIAGNOSIS — Z720 Tobacco use: Secondary | ICD-10-CM | POA: Diagnosis not present

## 2020-05-24 LAB — POCT URINE PREGNANCY: Preg Test, Ur: POSITIVE — AB

## 2020-05-24 NOTE — Progress Notes (Signed)
HPI:      [name] is a 29 y.o. [id number] who LMP was Patient's last menstrual period was 03/12/2020.  Subjective:   She presents today after missing menstrual periods. She has been taking prenatal vitamins. She says she was trying to become pregnant but was having unprotected intercourse for a long time without resultant pregnancy so was " not sure she could still become pregnant." Patient's previous pregnancies were relatively uncomplicated vaginal births. Patient smokes cigarettes but " has cut down a lot since learning she was pregnant." Patient states she has breast fed her other babies and desires to breast-feed again.    Hx: The following portions of the patient's history were reviewed and updated as appropriate:             She  has a past medical history of Anxiety, Depression, Headache, and History of preterm delivery. She does not have any pertinent problems on file. She  has a past surgical history that includes No past surgeries and Endoscopic retrograde cholangiopancreatography (ercp) with propofol (N/A, 01/15/2018). Her family history includes Hypertension in her father. She  reports that she has been smoking. She has a 2.00 pack-year smoking history. She has never used smokeless tobacco. She reports that she does not drink alcohol and does not use drugs. She has a current medication list which includes the following prescription(s): multivitamin-prenatal. She is allergic to sunscreens.       Review of Systems:  Review of Systems  Constitutional: Denied constitutional symptoms, night sweats, recent illness, fatigue, fever, insomnia and weight loss.  Eyes: Denied eye symptoms, eye pain, photophobia, vision change and visual disturbance.  Ears/Nose/Throat/Neck: Denied ear, nose, throat or neck symptoms, hearing loss, nasal discharge, sinus congestion and sore throat.  Cardiovascular: Denied cardiovascular symptoms, arrhythmia, chest pain/pressure, edema,  exercise intolerance, orthopnea and palpitations.  Respiratory: Denied pulmonary symptoms, asthma, pleuritic pain, productive sputum, cough, dyspnea and wheezing.  Gastrointestinal: Denied, gastro-esophageal reflux, melena, nausea and vomiting.  Genitourinary: Denied genitourinary symptoms including symptomatic vaginal discharge, pelvic relaxation issues, and urinary complaints.  Musculoskeletal: Denied musculoskeletal symptoms, stiffness, swelling, muscle weakness and myalgia.  Dermatologic: Denied dermatology symptoms, rash and scar.  Neurologic: Denied neurology symptoms, dizziness, headache, neck pain and syncope.  Psychiatric: Denied psychiatric symptoms, anxiety and depression.  Endocrine: Denied endocrine symptoms including hot flashes and night sweats.   Meds:   Current Outpatient Medications on File Prior to Visit  Medication Sig Dispense Refill  . Prenatal Vit-Fe Fumarate-FA (MULTIVITAMIN-PRENATAL) 27-0.8 MG TABS tablet Take 1 tablet by mouth daily at 12 noon.     No current facility-administered medications on file prior to visit.    Objective:     Vitals:   05/24/20 1006  BP: 112/77  Pulse: (!) 118              Urinary pregnancy test positive  Assessment:    [id number] Patient Active Problem List   Diagnosis Date Noted  . Abdominal pain 01/15/2018  . Choledocholithiasis   . Jaundice   . Elevated liver enzymes   . Encounter for induction of labor 08/09/2017  . Late prenatal care 03/15/2017  . Nausea and vomiting during pregnancy prior to [redacted] weeks gestation 03/15/2017  . History of preterm delivery, currently pregnant in second trimester 03/15/2017  . Irregular uterine contractions 07/08/2015     1. Amenorrhea   2. Grand multipara   3. Tobacco abuse disorder     Approximately 10 weeks estimated gestational age based on LMP.  Plan:            Prenatal Plan 1.  The patient was given prenatal literature. 2.  She was continued on prenatal vitamins. 3.   A prenatal lab panel was ordered or drawn. 4.  An ultrasound was ordered to better determine an EDC. 5.  A nurse visit was scheduled. 6.  Genetic testing and testing for other inheritable conditions discussed in detail. She will decide in the future whether to have these labs performed. 7.  A general overview of pregnancy testing, visit schedule, ultrasound schedule, and prenatal care was discussed. 8.  COVID and its risks associated with pregnancy, prevention by limiting exposure and use of masks, as well as the risks and benefits of vaccination during pregnancy were discussed in detail.  [facility] policy regarding office and hospital visitation and testing was explained. 9.  Benefits of breast-feeding discussed in detail including both maternal and infant benefits. Ready Set Baby website discussed. 10. Discussed smoking cessation.  Orders Orders Placed This Encounter  Procedures  . US OB Comp Less 14 Wks  . POCT urine pregnancy    No orders of the defined types were placed in this encounter.     F/U  Return in about 3 weeks (around 06/14/2020). I spent 22 minutes involved in the care of this patient preparing to see the patient by obtaining and reviewing her medical history (including labs, imaging tests and prior procedures), documenting clinical information in the electronic health record (EHR), counseling and coordinating care plans, writing and sending prescriptions, ordering tests or procedures and directly communicating with the patient by discussing pertinent items from her history and physical exam as well as detailing my assessment and plan as noted above so that she has an informed understanding.  All of her questions were answered.  Elonda Husky, M.D. 05/24/2020 10:36 AM

## 2020-05-31 ENCOUNTER — Other Ambulatory Visit: Payer: Self-pay

## 2020-05-31 ENCOUNTER — Ambulatory Visit (INDEPENDENT_AMBULATORY_CARE_PROVIDER_SITE_OTHER): Payer: Medicaid Other

## 2020-05-31 DIAGNOSIS — Z641 Problems related to multiparity: Secondary | ICD-10-CM | POA: Diagnosis not present

## 2020-05-31 DIAGNOSIS — Z3A11 11 weeks gestation of pregnancy: Secondary | ICD-10-CM | POA: Diagnosis not present

## 2020-05-31 DIAGNOSIS — N912 Amenorrhea, unspecified: Secondary | ICD-10-CM

## 2020-06-14 ENCOUNTER — Other Ambulatory Visit (HOSPITAL_COMMUNITY)
Admission: RE | Admit: 2020-06-14 | Discharge: 2020-06-14 | Disposition: A | Discharge status: Home / Self Care | Payer: Medicaid Other | Source: Ambulatory Visit | Attending: Obstetrics and Gynecology | Admitting: Obstetrics and Gynecology

## 2020-06-14 ENCOUNTER — Encounter: Payer: Self-pay | Admitting: Obstetrics and Gynecology

## 2020-06-14 ENCOUNTER — Other Ambulatory Visit: Payer: Self-pay

## 2020-06-14 ENCOUNTER — Ambulatory Visit (INDEPENDENT_AMBULATORY_CARE_PROVIDER_SITE_OTHER): Payer: Medicaid Other | Admitting: Obstetrics and Gynecology

## 2020-06-14 VITALS — BP 112/73 | HR 96 | Wt 121.3 lb

## 2020-06-14 DIAGNOSIS — Z124 Encounter for screening for malignant neoplasm of cervix: Secondary | ICD-10-CM | POA: Diagnosis not present

## 2020-06-14 DIAGNOSIS — O30042 Twin pregnancy, dichorionic/diamniotic, second trimester: Secondary | ICD-10-CM

## 2020-06-14 DIAGNOSIS — Z641 Problems related to multiparity: Secondary | ICD-10-CM

## 2020-06-14 DIAGNOSIS — Z3492 Encounter for supervision of normal pregnancy, unspecified, second trimester: Secondary | ICD-10-CM

## 2020-06-14 DIAGNOSIS — Z419 Encounter for procedure for purposes other than remedying health state, unspecified: Secondary | ICD-10-CM | POA: Diagnosis not present

## 2020-06-14 LAB — OB RESULTS CONSOLE HIV ANTIBODY (ROUTINE TESTING): HIV: NONREACTIVE

## 2020-06-14 LAB — OB RESULTS CONSOLE RPR: RPR: NONREACTIVE

## 2020-06-14 LAB — OB RESULTS CONSOLE RUBELLA ANTIBODY, IGM: Rubella: IMMUNE

## 2020-06-14 LAB — OB RESULTS CONSOLE VARICELLA ZOSTER ANTIBODY, IGG: Varicella: IMMUNE

## 2020-06-14 LAB — OB RESULTS CONSOLE HEPATITIS B SURFACE ANTIGEN: Hepatitis B Surface Ag: NEGATIVE

## 2020-06-14 NOTE — Addendum Note (Signed)
Addended by: Dorian Pod on: 06/14/2020 11:22 AM   Modules accepted: Orders

## 2020-06-14 NOTE — Addendum Note (Signed)
Addended by: Dorian Pod on: 06/14/2020 11:11 AM   Modules accepted: Orders

## 2020-06-14 NOTE — Progress Notes (Signed)
NOB: Has no complaints.  Patient eating without difficulty and says she is eating regularly despite seeming lack of weight gain.  Taking vitamins as directed.  Cautiously excited about twins.  We have discussed twins and the increased ultrasounds monitoring and fetal surveillance performed mostly in the third trimester.  Early delivery discussed.  Fetal position discussed in relationship to vaginal birth versus cesarean delivery.  Patient desires genetic testing today.  Pap performed  Physical examination General NAD, Conversant  HEENT Atraumatic; Op clear with mmm.  Normo-cephalic. Pupils reactive. Anicteric sclerae  Thyroid/Neck Smooth without nodularity or enlargement. Normal ROM.  Neck Supple.  Skin No rashes, lesions or ulceration. Normal palpated skin turgor. No nodularity.  Breasts: No masses or discharge.  Symmetric.  No axillary adenopathy.  Lungs: Clear to auscultation.No rales or wheezes. Normal Respiratory effort, no retractions.  Heart: NSR.  No murmurs or rubs appreciated. No periferal edema  Abdomen: Soft.  Non-tender.  No masses.  No HSM. No hernia  Extremities: Moves all appropriately.  Normal ROM for age. No lymphadenopathy.  Neuro: Oriented to PPT.  Normal mood. Normal affect.     Pelvic:   Vulva: Normal appearance.  No lesions.  Vagina: No lesions or abnormalities noted.  Support: Normal pelvic support.  Urethra No masses tenderness or scarring.  Meatus Normal size without lesions or prolapse.  Cervix: Normal appearance.  No lesions.  Anus: Normal exam.  No lesions.  Perineum: Normal exam.  No lesions.        Bimanual   Adnexae: No masses.  Non-tender to palpation.  Uterus: Enlarged. 15 wks - Pos FHTs  Non-tender.  Mobile.  AV.  Adnexae: No masses.  Non-tender to palpation.  Cul-de-sac: Negative for abnormality.  Adnexae: No masses.  Non-tender to palpation.         Pelvimetry   Diagonal: Reached.  Spines: Average.  Sacrum: Concave.  Pubic Arch: Normal.

## 2020-06-15 LAB — CYTOLOGY - PAP
Comment: NEGATIVE
Diagnosis: NEGATIVE
High risk HPV: NEGATIVE

## 2020-06-15 LAB — HEPATITIS B SURFACE ANTIGEN: Hepatitis B Surface Ag: NEGATIVE

## 2020-06-15 LAB — URINALYSIS, ROUTINE W REFLEX MICROSCOPIC
Bilirubin, UA: NEGATIVE
Glucose, UA: NEGATIVE
Ketones, UA: NEGATIVE
Leukocytes,UA: NEGATIVE
Nitrite, UA: NEGATIVE
Protein,UA: NEGATIVE
RBC, UA: NEGATIVE
Specific Gravity, UA: 1.01 (ref 1.005–1.030)
Urobilinogen, Ur: 0.2 mg/dL (ref 0.2–1.0)
pH, UA: 8 — ABNORMAL HIGH (ref 5.0–7.5)

## 2020-06-15 LAB — MONITOR DRUG PROFILE 14(MW)
Amphetamine Scrn, Ur: NEGATIVE ng/mL
BARBITURATE SCREEN URINE: NEGATIVE ng/mL
BENZODIAZEPINE SCREEN, URINE: NEGATIVE ng/mL
Buprenorphine, Urine: NEGATIVE ng/mL
CANNABINOIDS UR QL SCN: NEGATIVE ng/mL
Cocaine (Metab) Scrn, Ur: NEGATIVE ng/mL
Creatinine(Crt), U: 31.5 mg/dL (ref 20.0–300.0)
Fentanyl, Urine: NEGATIVE pg/mL
Meperidine Screen, Urine: NEGATIVE ng/mL
Methadone Screen, Urine: NEGATIVE ng/mL
OXYCODONE+OXYMORPHONE UR QL SCN: NEGATIVE ng/mL
Opiate Scrn, Ur: NEGATIVE ng/mL
Ph of Urine: 7.9 (ref 4.5–8.9)
Phencyclidine Qn, Ur: NEGATIVE ng/mL
Propoxyphene Scrn, Ur: NEGATIVE ng/mL
SPECIFIC GRAVITY: 1.007
Tramadol Screen, Urine: NEGATIVE ng/mL

## 2020-06-15 LAB — ABO AND RH: Rh Factor: POSITIVE

## 2020-06-15 LAB — CBC WITH DIFFERENTIAL/PLATELET
Basophils Absolute: 0 10*3/uL (ref 0.0–0.2)
Basos: 0 %
EOS (ABSOLUTE): 0.1 10*3/uL (ref 0.0–0.4)
Eos: 1 %
Hematocrit: 33.2 % — ABNORMAL LOW (ref 34.0–46.6)
Hemoglobin: 11.8 g/dL (ref 11.1–15.9)
Immature Grans (Abs): 0 10*3/uL (ref 0.0–0.1)
Immature Granulocytes: 1 %
Lymphocytes Absolute: 1.7 10*3/uL (ref 0.7–3.1)
Lymphs: 19 %
MCH: 34.1 pg — ABNORMAL HIGH (ref 26.6–33.0)
MCHC: 35.5 g/dL (ref 31.5–35.7)
MCV: 96 fL (ref 79–97)
Monocytes Absolute: 0.5 10*3/uL (ref 0.1–0.9)
Monocytes: 5 %
Neutrophils Absolute: 6.4 10*3/uL (ref 1.4–7.0)
Neutrophils: 74 %
Platelets: 204 10*3/uL (ref 150–450)
RBC: 3.46 x10E6/uL — ABNORMAL LOW (ref 3.77–5.28)
RDW: 12.4 % (ref 11.7–15.4)
WBC: 8.8 10*3/uL (ref 3.4–10.8)

## 2020-06-15 LAB — NICOTINE SCREEN, URINE: Cotinine Ql Scrn, Ur: POSITIVE ng/mL — AB

## 2020-06-15 LAB — RUBELLA SCREEN: Rubella Antibodies, IGG: 3.19 index (ref >0.99)

## 2020-06-15 LAB — ANTIBODY SCREEN: Antibody Screen: NEGATIVE

## 2020-06-15 LAB — RPR: RPR Ser Ql: NONREACTIVE

## 2020-06-15 LAB — HIV ANTIBODY (ROUTINE TESTING W REFLEX): HIV Screen 4th Generation wRfx: NONREACTIVE

## 2020-06-15 LAB — VARICELLA ZOSTER ANTIBODY, IGG: Varicella zoster IgG: 1388 index (ref >165)

## 2020-06-16 LAB — GC/CHLAMYDIA PROBE AMP
Chlamydia trachomatis, NAA: NEGATIVE
Neisseria Gonorrhoeae by PCR: NEGATIVE

## 2020-06-16 LAB — CULTURE, OB URINE

## 2020-06-16 LAB — URINE CULTURE, OB REFLEX

## 2020-06-19 LAB — MATERNIT21  PLUS CORE+ESS, BLOOD
11q23 deletion (Jacobsen): NOT DETECTED
15q11 deletion (PW Angelman): NOT DETECTED
1p36 deletion syndrome: NOT DETECTED
22q11 deletion (DiGeorge): NOT DETECTED
4p16 deletion(Wolf-Hirschhorn): NOT DETECTED
5p15 deletion (Cri-du-chat): NOT DETECTED
8q24 deletion (Langer-Giedion): NOT DETECTED
Fetal Fraction: 9
Result (T21): NEGATIVE
Trisomy 13 (Patau syndrome): NEGATIVE
Trisomy 16: NOT DETECTED
Trisomy 18 (Edwards syndrome): NEGATIVE
Trisomy 21 (Down syndrome): NEGATIVE
Trisomy 22: NOT DETECTED

## 2020-06-20 NOTE — Telephone Encounter (Signed)
Patient called in stating she received her genetic testing results and doesn't understand them. Could you please advise?

## 2020-06-26 ENCOUNTER — Encounter: Payer: Self-pay | Admitting: Obstetrics and Gynecology

## 2020-07-12 NOTE — Progress Notes (Deleted)
ROB-PT present for routine prenatal care. Pt stated

## 2020-07-14 ENCOUNTER — Encounter: Payer: Medicaid Other | Admitting: Obstetrics and Gynecology

## 2020-07-14 DIAGNOSIS — Z419 Encounter for procedure for purposes other than remedying health state, unspecified: Secondary | ICD-10-CM | POA: Diagnosis not present

## 2020-07-28 ENCOUNTER — Encounter: Payer: Medicaid Other | Admitting: Obstetrics and Gynecology

## 2020-07-31 NOTE — Progress Notes (Signed)
ROB-Pt present for routine prenatal care. Pt stated that she was doing well no problems.  

## 2020-07-31 NOTE — Patient Instructions (Signed)
WHAT OB PATIENTS CAN EXPECT   Confirmation of pregnancy and ultrasound ordered if medically indicated-[redacted] weeks gestation  New OB (NOB) intake with nurse and New OB (NOB) labs- [redacted] weeks gestation  New OB (NOB) physical examination with provider- 11/[redacted] weeks gestation  Flu vaccine-[redacted] weeks gestation  Anatomy scan-[redacted] weeks gestation  Glucose tolerance test, blood work to test for anemia, T-dap vaccine-[redacted] weeks gestation  Vaginal swabs/cultures-STD/Group B strep-[redacted] weeks gestation  Appointments every 4 weeks until 28 weeks  Every 2 weeks from 28 weeks until 36 weeks  Weekly visits from 36 weeks until delivery  Second Trimester of Pregnancy  The second trimester is from week 14 through week 27 (month 4 through 6). This is often the time in pregnancy that you feel your best. Often times, morning sickness has lessened or quit. You may have more energy, and you may get hungry more often. Your unborn baby is growing rapidly. At the end of the sixth month, he or she is about 9 inches long and weighs about 1 pounds. You will likely feel the baby move between 18 and 20 weeks of pregnancy. Follow these instructions at home: Medicines  Take over-the-counter and prescription medicines only as told by your doctor. Some medicines are safe and some medicines are not safe during pregnancy.  Take a prenatal vitamin that contains at least 600 micrograms (mcg) of folic acid.  If you have trouble pooping (constipation), take medicine that will make your stool soft (stool softener) if your doctor approves. Eating and drinking   Eat regular, healthy meals.  Avoid raw meat and uncooked cheese.  If you get low calcium from the food you eat, talk to your doctor about taking a daily calcium supplement.  Avoid foods that are high in fat and sugars, such as fried and sweet foods.  If you feel sick to your stomach (nauseous) or throw up (vomit): ? Eat 4 or 5 small meals a day instead of 3 large  meals. ? Try eating a few soda crackers. ? Drink liquids between meals instead of during meals.  To prevent constipation: ? Eat foods that are high in fiber, like fresh fruits and vegetables, whole grains, and beans. ? Drink enough fluids to keep your pee (urine) clear or pale yellow. Activity  Exercise only as told by your doctor. Stop exercising if you start to have cramps.  Do not exercise if it is too hot, too humid, or if you are in a place of great height (high altitude).  Avoid heavy lifting.  Wear low-heeled shoes. Sit and stand up straight.  You can continue to have sex unless your doctor tells you not to. Relieving pain and discomfort  Wear a good support bra if your breasts are tender.  Take warm water baths (sitz baths) to soothe pain or discomfort caused by hemorrhoids. Use hemorrhoid cream if your doctor approves.  Rest with your legs raised if you have leg cramps or low back pain.  If you develop puffy, bulging veins (varicose veins) in your legs: ? Wear support hose or compression stockings as told by your doctor. ? Raise (elevate) your feet for 15 minutes, 3-4 times a day. ? Limit salt in your food. Prenatal care  Write down your questions. Take them to your prenatal visits.  Keep all your prenatal visits as told by your doctor. This is important. Safety  Wear your seat belt when driving.  Make a list of emergency phone numbers, including numbers for family, friends, the  hospital, and police and fire departments. General instructions  Ask your doctor about the right foods to eat or for help finding a counselor, if you need these services.  Ask your doctor about local prenatal classes. Begin classes before month 6 of your pregnancy.  Do not use hot tubs, steam rooms, or saunas.  Do not douche or use tampons or scented sanitary pads.  Do not cross your legs for long periods of time.  Visit your dentist if you have not done so. Use a soft toothbrush  to brush your teeth. Floss gently.  Avoid all smoking, herbs, and alcohol. Avoid drugs that are not approved by your doctor.  Do not use any products that contain nicotine or tobacco, such as cigarettes and e-cigarettes. If you need help quitting, ask your doctor.  Avoid cat litter boxes and soil used by cats. These carry germs that can cause birth defects in the baby and can cause a loss of your baby (miscarriage) or stillbirth. Contact a doctor if:  You have mild cramps or pressure in your lower belly.  You have pain when you pee (urinate).  You have bad smelling fluid coming from your vagina.  You continue to feel sick to your stomach (nauseous), throw up (vomit), or have watery poop (diarrhea).  You have a nagging pain in your belly area.  You feel dizzy. Get help right away if:  You have a fever.  You are leaking fluid from your vagina.  You have spotting or bleeding from your vagina.  You have severe belly cramping or pain.  You lose or gain weight rapidly.  You have trouble catching your breath and have chest pain.  You notice sudden or extreme puffiness (swelling) of your face, hands, ankles, feet, or legs.  You have not felt the baby move in over an hour.  You have severe headaches that do not go away when you take medicine.  You have trouble seeing. Summary  The second trimester is from week 14 through week 27 (months 4 through 6). This is often the time in pregnancy that you feel your best.  To take care of yourself and your unborn baby, you will need to eat healthy meals, take medicines only if your doctor tells you to do so, and do activities that are safe for you and your baby.  Call your doctor if you get sick or if you notice anything unusual about your pregnancy. Also, call your doctor if you need help with the right food to eat, or if you want to know what activities are safe for you. This information is not intended to replace advice given to you by  your health care provider. Make sure you discuss any questions you have with your health care provider. Document Revised: 01/22/2019 Document Reviewed: 11/05/2016 Elsevier Patient Education  [place]. Common Medications Safe in Pregnancy  Acne:      Constipation:  Benzoyl Peroxide     Colace  Clindamycin      Dulcolax Suppository  Topica Erythromycin     Fibercon  Salicylic Acid      Metamucil         Miralax AVOID:        Senakot   Accutane    Cough:  Retin-A       Cough Drops  Tetracycline      Phenergan w/ Codeine if Rx  Minocycline      Robitussin (Plain & DM)  Antibiotics:     Crabs/Lice:  Ceclor       RID  Cephalosporins    AVOID:  E-Mycins      Kwell  Keflex  Macrobid/Macrodantin   Diarrhea:  Penicillin      Kao-Pectate  Zithromax      Imodium AD         PUSH FLUIDS AVOID:       Cipro     Fever:  Tetracycline      Tylenol (Regular or Extra  Minocycline       Strength)  Levaquin      Extra Strength-Do not          Exceed 8 tabs/24 hrs Caffeine:        <259m/day (equiv. To 1 cup of coffee or  approx. 3 12 oz sodas)         Gas: Cold/Hayfever:       Gas-X  Benadryl      Mylicon  Claritin       Phazyme  **Claritin-D        Chlor-Trimeton    Headaches:  Dimetapp      ASA-Free Excedrin  Drixoral-Non-Drowsy     Cold Compress  Mucinex (Guaifenasin)     Tylenol (Regular or Extra  Sudafed/Sudafed-12 Hour     Strength)  **Sudafed PE Pseudoephedrine   Tylenol Cold & Sinus     Vicks Vapor Rub  Zyrtec  **AVOID if Problems With Blood Pressure         Heartburn: Avoid lying down for at least 1 hour after meals  Aciphex      Maalox     Rash:  Milk of Magnesia     Benadryl    Mylanta       1% Hydrocortisone Cream  Pepcid  Pepcid Complete   Sleep Aids:  Prevacid      Ambien   Prilosec       Benadryl  Rolaids       Chamomile Tea  Tums (Limit 4/day)     Unisom         Tylenol PM         Warm milk-add vanilla or  Hemorrhoids:       Sugar for  taste  Anusol/Anusol H.C.  (RX: Analapram 2.5%)  Sugar Substitutes:  Hydrocortisone OTC     Ok in moderation  Preparation H      Tucks        Vaseline lotion applied to tissue with wiping    Herpes:     Throat:  Acyclovir      Oragel  Famvir  Valtrex     Vaccines:         Flu Shot Leg Cramps:       *Gardasil  Benadryl      Hepatitis A         Hepatitis B Nasal Spray:       Pneumovax  Saline Nasal Spray     Polio Booster         Tetanus Nausea:       Tuberculosis test or PPD  Vitamin B6 25 mg TID   AVOID:    Dramamine      *Gardasil  Emetrol       Live Poliovirus  Ginger Root 250 mg QID    MMR (measles, mumps &  High Complex Carbs @ Bedtime    rebella)  Sea Bands-Accupressure    Varicella (Chickenpox)  Unisom 1/2 tab TID     *No known complications  If received before Pain:         Known pregnancy;   Darvocet       Resume series after  Lortab        Delivery  Percocet    Yeast:   Tramadol      Femstat  Tylenol 3      Gyne-lotrimin  Ultram       Monistat  Vicodin           MISC:         All Sunscreens           Hair Coloring/highlights          Insect Repellant's          (Including DEET)         Mystic Tans

## 2020-08-01 ENCOUNTER — Ambulatory Visit (INDEPENDENT_AMBULATORY_CARE_PROVIDER_SITE_OTHER): Payer: Medicaid Other | Admitting: Obstetrics and Gynecology

## 2020-08-01 ENCOUNTER — Other Ambulatory Visit: Payer: Self-pay

## 2020-08-01 ENCOUNTER — Encounter: Payer: Self-pay | Admitting: Obstetrics and Gynecology

## 2020-08-01 VITALS — BP 109/76 | HR 114 | Wt 130.4 lb

## 2020-08-01 DIAGNOSIS — Z3482 Encounter for supervision of other normal pregnancy, second trimester: Secondary | ICD-10-CM

## 2020-08-01 DIAGNOSIS — O30042 Twin pregnancy, dichorionic/diamniotic, second trimester: Secondary | ICD-10-CM

## 2020-08-01 DIAGNOSIS — Z3A20 20 weeks gestation of pregnancy: Secondary | ICD-10-CM

## 2020-08-01 LAB — POCT URINALYSIS DIPSTICK OB
Bilirubin, UA: NEGATIVE
Blood, UA: NEGATIVE
Glucose, UA: NEGATIVE
Ketones, UA: NEGATIVE
Leukocytes, UA: NEGATIVE
Nitrite, UA: NEGATIVE
POC,PROTEIN,UA: NEGATIVE
Spec Grav, UA: <=1.005 — AB (ref 1.010–1.025)
Urobilinogen, UA: 0.2 E.U./dL
pH, UA: 6.5 (ref 5.0–8.0)

## 2020-08-01 NOTE — Progress Notes (Signed)
ROB: Patient missed her last appointment because she had car trouble and overslept.  She has not been diligent about taking her prenatal vitamins because she says they are difficult to find in the stores.  Desires AFP today.  We will schedule FAS this week.  I have stressed the importance of prenatal vitamins and iron during this pregnancy and she will begin to be more compliant with this.  Compliance with examinations (Ultrasound) and prenatal visits discussed increased.  Weight gain noted.!  Reports daily fetal movement.

## 2020-08-03 LAB — AFP, SERUM, OPEN SPINA BIFIDA
AFP MoM: 2.14
AFP Value: 134.8 ng/mL
Gest. Age on Collection Date: 20 weeks
Maternal Age At EDD: 29.1 yr
OSBR Risk 1 IN: 1289
Test Results:: NEGATIVE
Weight: 130 [lb_av]

## 2020-08-03 LAB — DRUG PROFILE, UR, 9 DRUGS (LABCORP)
Amphetamines, Urine: NEGATIVE ng/mL
Barbiturate Quant, Ur: NEGATIVE ng/mL
Benzodiazepine Quant, Ur: NEGATIVE ng/mL
Cannabinoid Quant, Ur: NEGATIVE ng/mL
Cocaine (Metab.): NEGATIVE ng/mL
Methadone Screen, Urine: NEGATIVE ng/mL
Opiate Quant, Ur: NEGATIVE ng/mL
PCP Quant, Ur: NEGATIVE ng/mL
Propoxyphene: NEGATIVE ng/mL

## 2020-08-08 ENCOUNTER — Other Ambulatory Visit: Payer: Medicaid Other

## 2020-08-09 ENCOUNTER — Other Ambulatory Visit: Payer: Medicaid Other

## 2020-08-14 ENCOUNTER — Other Ambulatory Visit (INDEPENDENT_AMBULATORY_CARE_PROVIDER_SITE_OTHER): Payer: Medicaid Other

## 2020-08-14 ENCOUNTER — Other Ambulatory Visit: Payer: Self-pay

## 2020-08-14 ENCOUNTER — Other Ambulatory Visit: Payer: Self-pay | Admitting: Internal Medicine

## 2020-08-14 ENCOUNTER — Other Ambulatory Visit: Payer: Self-pay | Admitting: Obstetrics and Gynecology

## 2020-08-14 DIAGNOSIS — Z3492 Encounter for supervision of normal pregnancy, unspecified, second trimester: Secondary | ICD-10-CM

## 2020-08-14 DIAGNOSIS — Z419 Encounter for procedure for purposes other than remedying health state, unspecified: Secondary | ICD-10-CM | POA: Diagnosis not present

## 2020-08-28 NOTE — Progress Notes (Signed)
ROB-Pt present for routine prenatal care. Pt stated that she was doing well.  

## 2020-08-29 ENCOUNTER — Ambulatory Visit (INDEPENDENT_AMBULATORY_CARE_PROVIDER_SITE_OTHER): Payer: Medicaid Other | Admitting: Obstetrics and Gynecology

## 2020-08-29 ENCOUNTER — Other Ambulatory Visit: Payer: Self-pay

## 2020-08-29 ENCOUNTER — Encounter: Payer: Self-pay | Admitting: Obstetrics and Gynecology

## 2020-08-29 VITALS — BP 97/63 | HR 106 | Wt 135.0 lb

## 2020-08-29 DIAGNOSIS — O30042 Twin pregnancy, dichorionic/diamniotic, second trimester: Secondary | ICD-10-CM | POA: Insufficient documentation

## 2020-08-29 DIAGNOSIS — O99619 Diseases of the digestive system complicating pregnancy, unspecified trimester: Secondary | ICD-10-CM

## 2020-08-29 DIAGNOSIS — O09892 Supervision of other high risk pregnancies, second trimester: Secondary | ICD-10-CM

## 2020-08-29 DIAGNOSIS — Z641 Problems related to multiparity: Secondary | ICD-10-CM

## 2020-08-29 DIAGNOSIS — K219 Gastro-esophageal reflux disease without esophagitis: Secondary | ICD-10-CM

## 2020-08-29 DIAGNOSIS — O479 False labor, unspecified: Secondary | ICD-10-CM

## 2020-08-29 DIAGNOSIS — Z3482 Encounter for supervision of other normal pregnancy, second trimester: Secondary | ICD-10-CM

## 2020-08-29 DIAGNOSIS — Z3A24 24 weeks gestation of pregnancy: Secondary | ICD-10-CM

## 2020-08-29 HISTORY — DX: Twin pregnancy, dichorionic/diamniotic, second trimester: O30.042

## 2020-08-29 LAB — POCT URINALYSIS DIPSTICK OB
Bilirubin, UA: NEGATIVE
Blood, UA: NEGATIVE
Glucose, UA: NEGATIVE
Ketones, UA: NEGATIVE
Leukocytes, UA: NEGATIVE
Nitrite, UA: NEGATIVE
POC,PROTEIN,UA: NEGATIVE
Spec Grav, UA: <=1.005 — AB (ref 1.010–1.025)
Urobilinogen, UA: 0.2 E.U./dL
pH, UA: 7.5 (ref 5.0–8.0)

## 2020-08-29 MED ORDER — PANTOPRAZOLE SODIUM 20 MG PO TBEC: 20.0000 mg | DELAYED_RELEASE_TABLET | Freq: Every day | ORAL | 1 refills | Status: DC

## 2020-08-29 NOTE — Patient Instructions (Signed)
Common Medications Safe in Pregnancy  Acne:      Constipation:  Benzoyl Peroxide     Colace  Clindamycin      Dulcolax Suppository  Topica Erythromycin     Fibercon  Salicylic Acid      Metamucil         Miralax AVOID:        Senakot   Accutane    Cough:  Retin-A       Cough Drops  Tetracycline      Phenergan w/ Codeine if Rx  Minocycline      Robitussin (Plain & DM)  Antibiotics:     Crabs/Lice:  Ceclor       RID  Cephalosporins    AVOID:  E-Mycins      Kwell  Keflex  Macrobid/Macrodantin   Diarrhea:  Penicillin      Kao-Pectate  Zithromax      Imodium AD         PUSH FLUIDS AVOID:       Cipro     Fever:  Tetracycline      Tylenol (Regular or Extra  Minocycline       Strength)  Levaquin      Extra Strength-Do not          Exceed 8 tabs/24 hrs Caffeine:        <200mg/day (equiv. To 1 cup of coffee or  approx. 3 12 oz sodas)         Gas: Cold/Hayfever:       Gas-X  Benadryl      Mylicon  Claritin       Phazyme  **Claritin-D        Chlor-Trimeton    Headaches:  Dimetapp      ASA-Free Excedrin  Drixoral-Non-Drowsy     Cold Compress  Mucinex (Guaifenasin)     Tylenol (Regular or Extra  Sudafed/Sudafed-12 Hour     Strength)  **Sudafed PE Pseudoephedrine   Tylenol Cold & Sinus     Vicks Vapor Rub  Zyrtec  **AVOID if Problems With Blood Pressure         Heartburn: Avoid lying down for at least 1 hour after meals  Aciphex      Maalox     Rash:  Milk of Magnesia     Benadryl    Mylanta       1% Hydrocortisone Cream  Pepcid  Pepcid Complete   Sleep Aids:  Prevacid      Ambien   Prilosec       Benadryl  Rolaids       Chamomile Tea  Tums (Limit 4/day)     Unisom         Tylenol PM         Warm milk-add vanilla or  Hemorrhoids:       Sugar for taste  Anusol/Anusol H.C.  (RX: Analapram 2.5%)  Sugar Substitutes:  Hydrocortisone OTC     Ok in moderation  Preparation H      Tucks        Vaseline lotion applied to tissue with  wiping    Herpes:     Throat:  Acyclovir      Oragel  Famvir  Valtrex     Vaccines:         Flu Shot Leg Cramps:       *Gardasil  Benadryl      Hepatitis A         Hepatitis B Nasal Spray:         Pneumovax  Saline Nasal Spray     Polio Booster         Tetanus Nausea:       Tuberculosis test or PPD  Vitamin B6 25 mg TID   AVOID:    Dramamine      *Gardasil  Emetrol       Live Poliovirus  Ginger Root 250 mg QID    MMR (measles, mumps &  High Complex Carbs @ Bedtime    rebella)  Sea Bands-Accupressure    Varicella (Chickenpox)  Unisom 1/2 tab TID     *No known complications           If received before Pain:         Known pregnancy;   Darvocet       Resume series after  Lortab        Delivery  Percocet    Yeast:   Tramadol      Femstat  Tylenol 3      Gyne-lotrimin  Ultram       Monistat  Vicodin           MISC:         All Sunscreens           Hair Coloring/highlights          Insect Repellant's          (Including DEET)         Mystic Tans    GESTATIONAL DIABETES TESTING FOR PREGNANCY  Pregnant women can develop a condition known as Gestational Diabetes (diabetes brought on by pregnancy) which can pose a risk to both mother and baby. A glucose tolerance test is a common type of testing for potential gestational diabetes.  There are several tests intended to identify gestational diabetes in pregnant women. The first, called the Glucose Challenge Screening, is a preliminary screening test performed between 26-28 weeks. If a woman tests positive during this screening test, the second test, called the Glucose Tolerance Test, may be performed. This test will diagnose whether diabetes exists or not by indicating whether or not the body is using glucose (a type of sugar) effectively.  The Glucose Challenge Screening is now considered to be a standard test performed during the early part of the third trimester of pregnancy.  What is the Glucose Challenge Screening Test? No  preparation is required prior to the test. During the test, the mother is asked to drink a sweet liquid (glucose) and then will have blood drawn one hour from having the drink, as blood glucose levels normally peak within one hour. No fasting is required prior to this test.  The test evaluates how your body processes sugar. A high level in your blood may indicate your body is not processing sugar effectively (positive test). If the results of this screen are positive, the woman may have the Glucose Tolerance Test performed. It is important to note that not all women who test positive for the Glucose Challenge Screening test are found to have diabetes upon further diagnosis.  What is the Glucose Tolerance Test? Prior to the taking the glucose tolerance test, your doctor will ask you to make sure and eat at least 163m of carbohydrates (about what you will get from a slice or two of bread) for three days prior to the time you will be asked to fast. You will not be permitted to eat or drink anything but sips of water for 14 hours prior to the test, so it  is best to schedule the test for first thing in the morning.  Additionally, you should plan to have someone drive you to and from the test since your energy levels may be low and there is a slight possibility you may feel light-headed.  When you arrive, the technician will draw blood to measure your baseline "fasting blood glucose level". You will be asked to drink a larger volume (or more concentrated solution) of the glucose drink than was used in the initial Glucose Challenge Screening test. Your blood will be drawn and tested every hour for the next three hours.  The following are the values that the American Diabetes Association considers to be abnormal during the Glucose Tolerance Test:  Interval Abnormal reading Fasting 95 mg/dl or higher One hour 180 mg/dl or higher Two hours 155 mg/dl or higher Three hours 140 mg/dl or higher  What if my  Glucose Tolerance Test Results are Abnormal? If only one of your readings comes back abnormal, your doctor may suggest some changes to your diet and/or test you again later in the pregnancy. If two or more of your readings come back abnormal, you'll be diagnosed with Gestational Diabetes and your doctor or midwife will talk to you about a treatment plan. Treating diabetes during pregnancy is extremely important to protect the health of both mother and baby.   Compiled using information from the following sources:  1. American Diabetes Association  https://www.diabetes.org  2. [employer]  https://www.emedicine.com  3. [employer] of Diabetes and Digestive and Kidney Diseases     Breastfeeding  Choosing to breastfeed is one of the best decisions you can make for yourself and your baby. A change in hormones during pregnancy causes your breasts to make breast milk in your milk-producing glands. Hormones prevent breast milk from being released before your baby is born. They also prompt milk flow after birth. Once breastfeeding has begun, thoughts of your baby, as well as his or her sucking or crying, can stimulate the release of milk from your milk-producing glands. Benefits of breastfeeding Research shows that breastfeeding offers many health benefits for infants and mothers. It also offers a cost-free and convenient way to feed your baby. For your baby  Your first milk (colostrum) helps your baby's digestive system to function better.  Special cells in your milk (antibodies) help your baby to fight off infections.  Breastfed babies are less likely to develop asthma, allergies, obesity, or type 2 diabetes. They are also at lower risk for sudden infant death syndrome (SIDS).  Nutrients in breast milk are better able to meet your baby's needs compared to infant formula.  Breast milk improves your baby's brain development. For you  Breastfeeding helps to create a very special bond  between you and your baby.  Breastfeeding is convenient. Breast milk costs nothing and is always available at the correct temperature.  Breastfeeding helps to burn calories. It helps you to lose the weight that you gained during pregnancy.  Breastfeeding makes your uterus return faster to its size before pregnancy. It also slows bleeding (lochia) after you give birth.  Breastfeeding helps to lower your risk of developing type 2 diabetes, osteoporosis, rheumatoid arthritis, cardiovascular disease, and breast, ovarian, uterine, and endometrial cancer later in life. Breastfeeding basics Starting breastfeeding  Find a comfortable place to sit or lie down, with your neck and back well-supported.  Place a pillow or a rolled-up blanket under your baby to bring him or her to the level of your breast (if  you are seated). Nursing pillows are specially designed to help support your arms and your baby while you breastfeed.  Make sure that your baby's tummy (abdomen) is facing your abdomen.  Gently massage your breast. With your fingertips, massage from the outer edges of your breast inward toward the nipple. This encourages milk flow. If your milk flows slowly, you may need to continue this action during the feeding.  Support your breast with 4 fingers underneath and your thumb above your nipple (make the letter "C" with your hand). Make sure your fingers are well away from your nipple and your baby's mouth.  Stroke your baby's lips gently with your finger or nipple.  When your baby's mouth is open wide enough, quickly bring your baby to your breast, placing your entire nipple and as much of the areola as possible into your baby's mouth. The areola is the colored area around your nipple. ? More areola should be visible above your baby's upper lip than below the lower lip. ? Your baby's lips should be opened and extended outward (flanged) to ensure an adequate, comfortable latch. ? Your baby's tongue  should be between his or her lower gum and your breast.  Make sure that your baby's mouth is correctly positioned around your nipple (latched). Your baby's lips should create a seal on your breast and be turned out (everted).  It is common for your baby to suck about 2-3 minutes in order to start the flow of breast milk. Latching Teaching your baby how to latch onto your breast properly is very important. An improper latch can cause nipple pain, decreased milk supply, and poor weight gain in your baby. Also, if your baby is not latched onto your nipple properly, he or she may swallow some air during feeding. This can make your baby fussy. Burping your baby when you switch breasts during the feeding can help to get rid of the air. However, teaching your baby to latch on properly is still the best way to prevent fussiness from swallowing air while breastfeeding. Signs that your baby has successfully latched onto your nipple  Silent tugging or silent sucking, without causing you pain. Infant's lips should be extended outward (flanged).  Swallowing heard between every 3-4 sucks once your milk has started to flow (after your let-down milk reflex occurs).  Muscle movement above and in front of his or her ears while sucking. Signs that your baby has not successfully latched onto your nipple  Sucking sounds or smacking sounds from your baby while breastfeeding.  Nipple pain. If you think your baby has not latched on correctly, slip your finger into the corner of your baby's mouth to break the suction and place it between your baby's gums. Attempt to start breastfeeding again. Signs of successful breastfeeding Signs from your baby  Your baby will gradually decrease the number of sucks or will completely stop sucking.  Your baby will fall asleep.  Your baby's body will relax.  Your baby will retain a small amount of milk in his or her mouth.  Your baby will let go of your breast by himself or  herself. Signs from you  Breasts that have increased in firmness, weight, and size 1-3 hours after feeding.  Breasts that are softer immediately after breastfeeding.  Increased milk volume, as well as a change in milk consistency and color by the fifth day of breastfeeding.  Nipples that are not sore, cracked, or bleeding. Signs that your baby is getting enough milk  Wetting at least 1-2 diapers during the first 24 hours after birth.  Wetting at least 5-6 diapers every 24 hours for the first week after birth. The urine should be clear or pale yellow by the age of 5 days.  Wetting 6-8 diapers every 24 hours as your baby continues to grow and develop.  At least 3 stools in a 24-hour period by the age of 5 days. The stool should be soft and yellow.  At least 3 stools in a 24-hour period by the age of 7 days. The stool should be seedy and yellow.  No loss of weight greater than 10% of birth weight during the first 3 days of life.  Average weight gain of 4-7 oz (113-198 g) per week after the age of 4 days.  Consistent daily weight gain by the age of 5 days, without weight loss after the age of 2 weeks. After a feeding, your baby may spit up a small amount of milk. This is normal. Breastfeeding frequency and duration Frequent feeding will help you make more milk and can prevent sore nipples and extremely full breasts (breast engorgement). Breastfeed when you feel the need to reduce the fullness of your breasts or when your baby shows signs of hunger. This is called "breastfeeding on demand." Signs that your baby is hungry include:  Increased alertness, activity, or restlessness.  Movement of the head from side to side.  Opening of the mouth when the corner of the mouth or cheek is stroked (rooting).  Increased sucking sounds, smacking lips, cooing, sighing, or squeaking.  Hand-to-mouth movements and sucking on fingers or hands.  Fussing or crying. Avoid introducing a pacifier to  your baby in the first 4-6 weeks after your baby is born. After this time, you may choose to use a pacifier. Research has shown that pacifier use during the first year of a baby's life decreases the risk of sudden infant death syndrome (SIDS). Allow your baby to feed on each breast as long as he or she wants. When your baby unlatches or falls asleep while feeding from the first breast, offer the second breast. Because newborns are often sleepy in the first few weeks of life, you may need to awaken your baby to get him or her to feed. Breastfeeding times will vary from baby to baby. However, the following rules can serve as a guide to help you make sure that your baby is properly fed:  Newborns (babies 44 weeks of age or younger) may breastfeed every 1-3 hours.  Newborns should not go without breastfeeding for longer than 3 hours during the day or 5 hours during the night.  You should breastfeed your baby a minimum of 8 times in a 24-hour period. Breast milk pumping     Pumping and storing breast milk allows you to make sure that your baby is exclusively fed your breast milk, even at times when you are unable to breastfeed. This is especially important if you go back to work while you are still breastfeeding, or if you are not able to be present during feedings. Your lactation consultant can help you find a method of pumping that works best for you and give you guidelines about how long it is safe to store breast milk. Caring for your breasts while you breastfeed Nipples can become dry, cracked, and sore while breastfeeding. The following recommendations can help keep your breasts moisturized and healthy:  Avoid using soap on your nipples.  Wear a supportive bra designed  especially for nursing. Avoid wearing underwire-style bras or extremely tight bras (sports bras).  Air-dry your nipples for 3-4 minutes after each feeding.  Use only cotton bra pads to absorb leaked breast milk. Leaking of  breast milk between feedings is normal.  Use lanolin on your nipples after breastfeeding. Lanolin helps to maintain your skin's normal moisture barrier. Pure lanolin is not harmful (not toxic) to your baby. You may also hand express a few drops of breast milk and gently massage that milk into your nipples and allow the milk to air-dry. In the first few weeks after giving birth, some women experience breast engorgement. Engorgement can make your breasts feel heavy, warm, and tender to the touch. Engorgement peaks within 3-5 days after you give birth. The following recommendations can help to ease engorgement:  Completely empty your breasts while breastfeeding or pumping. You may want to start by applying warm, moist heat (in the shower or with warm, water-soaked hand towels) just before feeding or pumping. This increases circulation and helps the milk flow. If your baby does not completely empty your breasts while breastfeeding, pump any extra milk after he or she is finished.  Apply ice packs to your breasts immediately after breastfeeding or pumping, unless this is too uncomfortable for you. To do this: ? Put ice in a plastic bag. ? Place a towel between your skin and the bag. ? Leave the ice on for 20 minutes, 2-3 times a day.  Make sure that your baby is latched on and positioned properly while breastfeeding. If engorgement persists after 48 hours of following these recommendations, contact your health care provider or a Science writer. Overall health care recommendations while breastfeeding  Eat 3 healthy meals and 3 snacks every day. Well-nourished mothers who are breastfeeding need an additional 450-500 calories a day. You can meet this requirement by increasing the amount of a balanced diet that you eat.  Drink enough water to keep your urine pale yellow or clear.  Rest often, relax, and continue to take your prenatal vitamins to prevent fatigue, stress, and low vitamin and mineral  levels in your body (nutrient deficiencies).  Do not use any products that contain nicotine or tobacco, such as cigarettes and e-cigarettes. Your baby may be harmed by chemicals from cigarettes that pass into breast milk and exposure to secondhand smoke. If you need help quitting, ask your health care provider.  Avoid alcohol.  Do not use illegal drugs or marijuana.  Talk with your health care provider before taking any medicines. These include over-the-counter and prescription medicines as well as vitamins and herbal supplements. Some medicines that may be harmful to your baby can pass through breast milk.  It is possible to become pregnant while breastfeeding. If birth control is desired, ask your health care provider about options that will be safe while breastfeeding your baby. Where to find more information: [employer]: www.llli.org Contact a health care provider if:  You feel like you want to stop breastfeeding or have become frustrated with breastfeeding.  Your nipples are cracked or bleeding.  Your breasts are red, tender, or warm.  You have: ? Painful breasts or nipples. ? A swollen area on either breast. ? A fever or chills. ? Nausea or vomiting. ? Drainage other than breast milk from your nipples.  Your breasts do not become full before feedings by the fifth day after you give birth.  You feel sad and depressed.  Your baby is: ? Too sleepy to  eat well. ? Having trouble sleeping. ? More than 26 week old and wetting fewer than 6 diapers in a 24-hour period. ? Not gaining weight by 2 days of age.  Your baby has fewer than 3 stools in a 24-hour period.  Your baby's skin or the white parts of his or her eyes become yellow. Get help right away if:  Your baby is overly tired (lethargic) and does not want to wake up and feed.  Your baby develops an unexplained fever. Summary  Breastfeeding offers many health benefits for infant and mothers.  Try  to breastfeed your infant when he or she shows early signs of hunger.  Gently tickle or stroke your baby's lips with your finger or nipple to allow the baby to open his or her mouth. Bring the baby to your breast. Make sure that much of the areola is in your baby's mouth. Offer one side and burp the baby before you offer the other side.  Talk with your health care provider or lactation consultant if you have questions or you face problems as you breastfeed. This information is not intended to replace advice given to you by your health care provider. Make sure you discuss any questions you have with your health care provider. Document Revised: 12/25/2017 Document Reviewed: 11/01/2016 Elsevier Patient Education  [place].

## 2020-08-29 NOTE — Progress Notes (Signed)
ROB: Notes heartburn mostly in evenings, minimal relief with Tums.  Will prescribe Protonix. Plans to breastfeed (but notes issues with breastfeeding in the past with each pregnancy, despite assistance with lactation). Declines flu vaccine. Also reporting Deberah Pelton, wonders if it is too early. Discussed Braxton Hicks vs PTL as patient has a prior history. RTC in 4 weeks, for 28 week labs and growth ultrasound for twins. Discussed need for serial growth scans after this.

## 2020-09-13 DIAGNOSIS — Z419 Encounter for procedure for purposes other than remedying health state, unspecified: Secondary | ICD-10-CM | POA: Diagnosis not present

## 2020-10-03 ENCOUNTER — Other Ambulatory Visit: Payer: Medicaid Other

## 2020-10-03 ENCOUNTER — Encounter: Payer: Medicaid Other | Admitting: Obstetrics and Gynecology

## 2020-10-14 DIAGNOSIS — Z419 Encounter for procedure for purposes other than remedying health state, unspecified: Secondary | ICD-10-CM | POA: Diagnosis not present

## 2020-10-18 ENCOUNTER — Other Ambulatory Visit: Payer: Medicaid Other

## 2020-10-18 ENCOUNTER — Encounter: Payer: Medicaid Other | Admitting: Obstetrics and Gynecology

## 2020-11-07 ENCOUNTER — Ambulatory Visit (INDEPENDENT_AMBULATORY_CARE_PROVIDER_SITE_OTHER): Payer: Medicaid Other

## 2020-11-07 ENCOUNTER — Other Ambulatory Visit: Payer: Medicaid Other

## 2020-11-07 ENCOUNTER — Other Ambulatory Visit: Payer: Self-pay | Admitting: Obstetrics and Gynecology

## 2020-11-07 ENCOUNTER — Ambulatory Visit (INDEPENDENT_AMBULATORY_CARE_PROVIDER_SITE_OTHER): Payer: Medicaid Other | Admitting: Obstetrics and Gynecology

## 2020-11-07 ENCOUNTER — Telehealth: Payer: Self-pay

## 2020-11-07 ENCOUNTER — Other Ambulatory Visit: Payer: Self-pay

## 2020-11-07 VITALS — BP 106/67 | HR 92 | Wt 147.6 lb

## 2020-11-07 DIAGNOSIS — O365930 Maternal care for other known or suspected poor fetal growth, third trimester, not applicable or unspecified: Secondary | ICD-10-CM

## 2020-11-07 DIAGNOSIS — O30043 Twin pregnancy, dichorionic/diamniotic, third trimester: Secondary | ICD-10-CM

## 2020-11-07 DIAGNOSIS — O30042 Twin pregnancy, dichorionic/diamniotic, second trimester: Secondary | ICD-10-CM

## 2020-11-07 DIAGNOSIS — Z3482 Encounter for supervision of other normal pregnancy, second trimester: Secondary | ICD-10-CM

## 2020-11-07 DIAGNOSIS — O30003 Twin pregnancy, unspecified number of placenta and unspecified number of amniotic sacs, third trimester: Secondary | ICD-10-CM

## 2020-11-07 DIAGNOSIS — Z3A34 34 weeks gestation of pregnancy: Secondary | ICD-10-CM

## 2020-11-07 LAB — POCT URINALYSIS DIPSTICK OB
Bilirubin, UA: NEGATIVE
Glucose, UA: NEGATIVE
Ketones, UA: NEGATIVE
Nitrite, UA: NEGATIVE
Odor: NEGATIVE
Spec Grav, UA: 1.01 (ref 1.010–1.025)
Urobilinogen, UA: 0.2 E.U./dL
pH, UA: 7.5 (ref 5.0–8.0)

## 2020-11-07 NOTE — Telephone Encounter (Signed)
Pt called in and stated that she would like a call back from a nurse. The pt got her results on mychart. The pt would like to go over them, she sounded worried. The pt stated that it said something about a trace of blood. I told the pt, I will send a message to the nurse. That results come to the pt first then then provider has to go over them. Please advise

## 2020-11-07 NOTE — Progress Notes (Signed)
ROB:  [place] reviewed with pt.  Babies non-vertex.  Size discrepancy 28%.  Refer to MFM for [place] and possible dopplers.  Discussed with Dr. Grace Bushy.  Discussed with pt.    Appointment Thursday this week.

## 2020-11-07 NOTE — Telephone Encounter (Signed)
mychart message sent to patient

## 2020-11-07 NOTE — Progress Notes (Signed)
ROB- No complaints. Declines TDAP.

## 2020-11-08 LAB — CBC
Hematocrit: 27.1 % — ABNORMAL LOW (ref 34.0–46.6)
Hemoglobin: 9.1 g/dL — ABNORMAL LOW (ref 11.1–15.9)
MCH: 29.5 pg (ref 26.6–33.0)
MCHC: 33.6 g/dL (ref 31.5–35.7)
MCV: 88 fL (ref 79–97)
Platelets: 236 10*3/uL (ref 150–450)
RBC: 3.08 x10E6/uL — ABNORMAL LOW (ref 3.77–5.28)
RDW: 12.2 % (ref 11.7–15.4)
WBC: 13.1 10*3/uL — ABNORMAL HIGH (ref 3.4–10.8)

## 2020-11-08 LAB — GLUCOSE, 1 HOUR GESTATIONAL: Gestational Diabetes Screen: 95 mg/dL (ref 65–139)

## 2020-11-08 LAB — RPR: RPR Ser Ql: NONREACTIVE

## 2020-11-08 LAB — HEPATITIS C ANTIBODY: Hep C Virus Ab: <0.1 s/co ratio (ref 0.0–0.9)

## 2020-11-08 NOTE — Telephone Encounter (Signed)
Pt is calling back, she is received  the rest of her lab results today. The pt is requesting a nurse to call her to go over them. I told her again that the pt receives them first then the provider will go over them. The pt verbally understood. Please advise

## 2020-11-08 NOTE — Telephone Encounter (Signed)
Spoke with patient and went over urine results. Patient is not having symptoms at this time. I told the patient if she isn't having symptoms and the provider did not go over then it was nothing to worry about at this time. Patient then went into asking about other lab results. I advised that Dr. Valentino Saxon will be sending her a message with results and what to do after she reviews them. Patient got upset when I told her that. I let her know that the doctor may have her take something for low hemoglobin, but she will have to review first. Patient then had questions about constipation. I let her know that she should increase her water and fiber intake. Patient verbalized understanding.

## 2020-11-08 NOTE — Telephone Encounter (Signed)
Patient called in stating that she needs a call back from the nurse that she has had multiple pregnancy's however has never had test results come straight to her. Patient states that she is unsure on what her test results mean. Patient is concerned because she is supposed to deliver in 2 weeks and wants to make sure that everything is looking okay and wants to ensure she is having a safe pregnancy/delivery. Patient was told that typically we allow the providers 24-72 hours to get back in touch with the patient when they call in. Patient verbalized understanding.  Could you please advise?

## 2020-11-09 ENCOUNTER — Other Ambulatory Visit: Payer: Self-pay

## 2020-11-09 ENCOUNTER — Other Ambulatory Visit: Payer: Self-pay | Admitting: Obstetrics and Gynecology

## 2020-11-09 ENCOUNTER — Ambulatory Visit: Payer: Medicaid Other | Attending: Obstetrics

## 2020-11-09 DIAGNOSIS — O321XX2 Maternal care for breech presentation, fetus 2: Secondary | ICD-10-CM | POA: Diagnosis not present

## 2020-11-09 DIAGNOSIS — O30043 Twin pregnancy, dichorionic/diamniotic, third trimester: Secondary | ICD-10-CM

## 2020-11-09 DIAGNOSIS — O30003 Twin pregnancy, unspecified number of placenta and unspecified number of amniotic sacs, third trimester: Secondary | ICD-10-CM

## 2020-11-09 DIAGNOSIS — O365932 Maternal care for other known or suspected poor fetal growth, third trimester, fetus 2: Secondary | ICD-10-CM | POA: Diagnosis not present

## 2020-11-09 DIAGNOSIS — Z3A34 34 weeks gestation of pregnancy: Secondary | ICD-10-CM | POA: Diagnosis not present

## 2020-11-09 DIAGNOSIS — O365930 Maternal care for other known or suspected poor fetal growth, third trimester, not applicable or unspecified: Secondary | ICD-10-CM

## 2020-11-13 ENCOUNTER — Other Ambulatory Visit: Payer: Self-pay | Admitting: Obstetrics and Gynecology

## 2020-11-13 ENCOUNTER — Other Ambulatory Visit: Payer: Self-pay | Admitting: Family

## 2020-11-13 DIAGNOSIS — O30043 Twin pregnancy, dichorionic/diamniotic, third trimester: Secondary | ICD-10-CM

## 2020-11-14 ENCOUNTER — Telehealth: Payer: Self-pay

## 2020-11-14 ENCOUNTER — Other Ambulatory Visit: Payer: Self-pay

## 2020-11-14 ENCOUNTER — Observation Stay
Admission: EM | Admit: 2020-11-14 | Discharge: 2020-11-14 | Disposition: A | Discharge status: Home / Self Care | Payer: Medicaid Other | Attending: Obstetrics and Gynecology | Admitting: Obstetrics and Gynecology

## 2020-11-14 ENCOUNTER — Ambulatory Visit: Payer: Medicaid Other | Attending: Maternal & Fetal Medicine

## 2020-11-14 DIAGNOSIS — O26893 Other specified pregnancy related conditions, third trimester: Secondary | ICD-10-CM

## 2020-11-14 DIAGNOSIS — O368331 Maternal care for abnormalities of the fetal heart rate or rhythm, third trimester, fetus 1: Principal | ICD-10-CM | POA: Insufficient documentation

## 2020-11-14 DIAGNOSIS — Z3A35 35 weeks gestation of pregnancy: Secondary | ICD-10-CM

## 2020-11-14 DIAGNOSIS — O30043 Twin pregnancy, dichorionic/diamniotic, third trimester: Secondary | ICD-10-CM | POA: Diagnosis not present

## 2020-11-14 DIAGNOSIS — R1011 Right upper quadrant pain: Secondary | ICD-10-CM

## 2020-11-14 DIAGNOSIS — Z419 Encounter for procedure for purposes other than remedying health state, unspecified: Secondary | ICD-10-CM | POA: Diagnosis not present

## 2020-11-14 DIAGNOSIS — R1030 Lower abdominal pain, unspecified: Secondary | ICD-10-CM

## 2020-11-14 LAB — URINALYSIS, ROUTINE W REFLEX MICROSCOPIC
Bilirubin Urine: NEGATIVE
Glucose, UA: NEGATIVE mg/dL
Hgb urine dipstick: NEGATIVE
Ketones, ur: 5 mg/dL — AB
Nitrite: NEGATIVE
Protein, ur: NEGATIVE mg/dL
Specific Gravity, Urine: 1.008 (ref 1.005–1.030)
pH: 6 (ref 5.0–8.0)

## 2020-11-14 MED ORDER — LACTATED RINGERS IV BOLUS
1000.0000 mL | Freq: Once | INTRAVENOUS | Status: AC
  Administered 2020-11-14: 1000 mL via INTRAVENOUS

## 2020-11-14 MED ORDER — ZOLPIDEM TARTRATE 5 MG PO TABS
5.0000 mg | ORAL_TABLET | Freq: Every evening | ORAL | Status: DC | PRN
  Administered 2020-11-14: 5 mg via ORAL
  Filled 2020-11-14: qty 1

## 2020-11-14 MED ORDER — LACTATED RINGERS IV SOLN: INTRAVENOUS | Status: DC

## 2020-11-14 NOTE — OB Triage Note (Signed)
Pt G10P6 at [redacted]w[redacted]d with c/o constant right upper abd pain that started 3 days ago and intermediate lower abd pain that is tightening. Denies LOF/bleeding. +FM. No concerns this pregnancy. VSS. Monitors applied.

## 2020-11-14 NOTE — Telephone Encounter (Signed)
Patient called in wanting to be seen for some back pain she was having during pregnancy. Patient was offered an appointment for tomorrow however the time frame did not work for her. Patient states that she will just head over to the hospital. Patient then hung up the phone.

## 2020-11-14 NOTE — Telephone Encounter (Signed)
Patients mother called today very irate, she states that her daughter is in 3rd trimester with twins, is high risk. Patients mother understands that she is not on DPR- and we can not discuss her care with her. She states that patient has been having back pain for 2 days now and lack of sleep, occurred after bending over; attempted treatment has been applying heat. Patient did call and receive the earliest apt available 11-15-2020 at 4:15. Pt has a history of cancelling appointments. Patients mother did calm down after speaking to [facility] and apologized for being vulgar but did mention law suit and thought this should be brought to your attention. Patient's mother stated she was going to take her to ER tonight due to pain. Patient's mother feels as though patient care has been slack and has not been check appropriately. Please advise.

## 2020-11-14 NOTE — Discharge Summary (Signed)
Discharge instructions provided to pt. Pt instructed to call tomorrow to schedule appointment with Dr. Logan Bores for Friday 11/17/2020. Pt informed about the importance of keeping follow up appointments. Red flag signs and symptoms reviewed. Pt verbalized understanding. Pt discharged home.

## 2020-11-15 ENCOUNTER — Other Ambulatory Visit: Payer: Self-pay | Admitting: Obstetrics and Gynecology

## 2020-11-15 ENCOUNTER — Observation Stay
Admission: EM | Admit: 2020-11-15 | Discharge: 2020-11-15 | Disposition: A | Discharge status: Home / Self Care | Payer: Medicaid Other | Attending: Obstetrics and Gynecology | Admitting: Obstetrics and Gynecology

## 2020-11-15 ENCOUNTER — Encounter: Payer: Medicaid Other | Admitting: Obstetrics and Gynecology

## 2020-11-15 ENCOUNTER — Other Ambulatory Visit: Payer: Self-pay

## 2020-11-15 ENCOUNTER — Ambulatory Visit (INDEPENDENT_AMBULATORY_CARE_PROVIDER_SITE_OTHER): Payer: Medicaid Other | Admitting: Obstetrics and Gynecology

## 2020-11-15 ENCOUNTER — Encounter: Payer: Self-pay | Admitting: Obstetrics and Gynecology

## 2020-11-15 VITALS — BP 137/79 | HR 80 | Wt 143.8 lb

## 2020-11-15 DIAGNOSIS — R1011 Right upper quadrant pain: Secondary | ICD-10-CM

## 2020-11-15 DIAGNOSIS — O30043 Twin pregnancy, dichorionic/diamniotic, third trimester: Secondary | ICD-10-CM | POA: Diagnosis not present

## 2020-11-15 DIAGNOSIS — O219 Vomiting of pregnancy, unspecified: Secondary | ICD-10-CM | POA: Diagnosis present

## 2020-11-15 DIAGNOSIS — Z3A35 35 weeks gestation of pregnancy: Secondary | ICD-10-CM | POA: Insufficient documentation

## 2020-11-15 DIAGNOSIS — Z641 Problems related to multiparity: Secondary | ICD-10-CM

## 2020-11-15 DIAGNOSIS — Z20822 Contact with and (suspected) exposure to covid-19: Secondary | ICD-10-CM | POA: Insufficient documentation

## 2020-11-15 DIAGNOSIS — R112 Nausea with vomiting, unspecified: Secondary | ICD-10-CM

## 2020-11-15 DIAGNOSIS — O26893 Other specified pregnancy related conditions, third trimester: Secondary | ICD-10-CM | POA: Diagnosis not present

## 2020-11-15 DIAGNOSIS — O30042 Twin pregnancy, dichorionic/diamniotic, second trimester: Secondary | ICD-10-CM

## 2020-11-15 DIAGNOSIS — O212 Late vomiting of pregnancy: Secondary | ICD-10-CM | POA: Diagnosis not present

## 2020-11-15 DIAGNOSIS — Z3483 Encounter for supervision of other normal pregnancy, third trimester: Secondary | ICD-10-CM

## 2020-11-15 LAB — URINE DRUG SCREEN, QUALITATIVE (ARMC ONLY)
Amphetamines, Ur Screen: NOT DETECTED
Barbiturates, Ur Screen: NOT DETECTED
Benzodiazepine, Ur Scrn: NOT DETECTED
Cannabinoid 50 Ng, Ur ~~LOC~~: NOT DETECTED
Cocaine Metabolite,Ur ~~LOC~~: NOT DETECTED
MDMA (Ecstasy)Ur Screen: NOT DETECTED
Methadone Scn, Ur: NOT DETECTED
Opiate, Ur Screen: NOT DETECTED
Phencyclidine (PCP) Ur S: NOT DETECTED
Tricyclic, Ur Screen: NOT DETECTED

## 2020-11-15 LAB — COMPREHENSIVE METABOLIC PANEL
ALT: 13 U/L (ref 0–44)
AST: 22 U/L (ref 15–41)
Albumin: 2.7 g/dL — ABNORMAL LOW (ref 3.5–5.0)
Alkaline Phosphatase: 232 U/L — ABNORMAL HIGH (ref 38–126)
Anion gap: 16 — ABNORMAL HIGH (ref 5–15)
BUN: <5 mg/dL — ABNORMAL LOW (ref 6–20)
CO2: 18 mmol/L — ABNORMAL LOW (ref 22–32)
Calcium: 8.9 mg/dL (ref 8.9–10.3)
Chloride: 101 mmol/L (ref 98–111)
Creatinine, Ser: 0.56 mg/dL (ref 0.44–1.00)
GFR, Estimated: >60 mL/min (ref >60)
Glucose, Bld: 77 mg/dL (ref 70–99)
Potassium: 3.9 mmol/L (ref 3.5–5.1)
Sodium: 135 mmol/L (ref 135–145)
Total Bilirubin: 1.4 mg/dL — ABNORMAL HIGH (ref 0.3–1.2)
Total Protein: 6.8 g/dL (ref 6.5–8.1)

## 2020-11-15 LAB — OB RESULTS CONSOLE GBS: GBS: NEGATIVE

## 2020-11-15 LAB — TYPE AND SCREEN
ABO/RH(D): AB POS
Antibody Screen: NEGATIVE

## 2020-11-15 LAB — SARS CORONAVIRUS 2 BY RT PCR (HOSPITAL ORDER, PERFORMED IN ~~LOC~~ HOSPITAL LAB): SARS Coronavirus 2: NEGATIVE

## 2020-11-15 LAB — CBC WITH DIFFERENTIAL/PLATELET
Abs Immature Granulocytes: 0.19 10*3/uL — ABNORMAL HIGH (ref 0.00–0.07)
Basophils Absolute: 0.1 10*3/uL (ref 0.0–0.1)
Basophils Relative: 0 %
Eosinophils Absolute: 0 10*3/uL (ref 0.0–0.5)
Eosinophils Relative: 0 %
HCT: 33.8 % — ABNORMAL LOW (ref 36.0–46.0)
Hemoglobin: 11 g/dL — ABNORMAL LOW (ref 12.0–15.0)
Immature Granulocytes: 1 %
Lymphocytes Relative: 13 %
Lymphs Abs: 2 10*3/uL (ref 0.7–4.0)
MCH: 29.4 pg (ref 26.0–34.0)
MCHC: 32.5 g/dL (ref 30.0–36.0)
MCV: 90.4 fL (ref 80.0–100.0)
Monocytes Absolute: 1 10*3/uL (ref 0.1–1.0)
Monocytes Relative: 7 %
Neutro Abs: 12 10*3/uL — ABNORMAL HIGH (ref 1.7–7.7)
Neutrophils Relative %: 79 %
Platelets: 278 10*3/uL (ref 150–400)
RBC: 3.74 MIL/uL — ABNORMAL LOW (ref 3.87–5.11)
RDW: 13.8 % (ref 11.5–15.5)
WBC: 15.3 10*3/uL — ABNORMAL HIGH (ref 4.0–10.5)
nRBC: 0.1 % (ref 0.0–0.2)

## 2020-11-15 LAB — PROTEIN / CREATININE RATIO, URINE
Creatinine, Urine: 79 mg/dL
Protein Creatinine Ratio: 0.27 mg/mg{Cre} — ABNORMAL HIGH (ref 0.00–0.15)
Total Protein, Urine: 21 mg/dL

## 2020-11-15 LAB — OB RESULTS CONSOLE GC/CHLAMYDIA
Chlamydia: NEGATIVE
Gonorrhea: NEGATIVE

## 2020-11-15 LAB — CHLAMYDIA/NGC RT PCR (ARMC ONLY)
Chlamydia Tr: NOT DETECTED
N gonorrhoeae: NOT DETECTED

## 2020-11-15 MED ORDER — ONDANSETRON 4 MG PO TBDP: 4.0000 mg | ORAL_TABLET | Freq: Four times a day (QID) | ORAL | 0 refills | Status: DC | PRN

## 2020-11-15 MED ORDER — TERBUTALINE SULFATE 1 MG/ML IJ SOLN
INTRAMUSCULAR | Status: AC
  Filled 2020-11-15: qty 1

## 2020-11-15 MED ORDER — LACTATED RINGERS IV SOLN: INTRAVENOUS | Status: DC

## 2020-11-15 MED ORDER — FAMOTIDINE 20 MG PO TABS: 20.0000 mg | ORAL_TABLET | Freq: Two times a day (BID) | ORAL | 1 refills | Status: AC

## 2020-11-15 MED ORDER — ACETAMINOPHEN 325 MG PO TABS: 650.0000 mg | ORAL_TABLET | ORAL | Status: DC | PRN

## 2020-11-15 MED ORDER — FAMOTIDINE IN NACL 20-0.9 MG/50ML-% IV SOLN
20.0000 mg | Freq: Once | INTRAVENOUS | Status: AC
  Administered 2020-11-15: 20 mg via INTRAVENOUS
  Filled 2020-11-15: qty 50

## 2020-11-15 MED ORDER — LACTATED RINGERS IV SOLN
500.0000 mL | INTRAVENOUS | Status: DC | PRN
  Administered 2020-11-15: 1000 mL via INTRAVENOUS

## 2020-11-15 MED ORDER — ONDANSETRON HCL 4 MG/2ML IJ SOLN
INTRAMUSCULAR | Status: AC
  Administered 2020-11-15: 4 mg via INTRAVENOUS
  Filled 2020-11-15: qty 2

## 2020-11-15 MED ORDER — ONDANSETRON HCL 4 MG/2ML IJ SOLN: 4.0000 mg | Freq: Four times a day (QID) | INTRAMUSCULAR | Status: DC | PRN

## 2020-11-15 MED ORDER — TERBUTALINE SULFATE 1 MG/ML IJ SOLN
0.2500 mg | Freq: Once | INTRAMUSCULAR | Status: AC
  Administered 2020-11-15: 0.25 mg via SUBCUTANEOUS

## 2020-11-15 NOTE — OB Triage Note (Signed)
Pt is a G10P6 at [redacted]w[redacted]d that was sent over from the office for fetal monitoring and labs. Pt c/o right epigastric pain for the past two days with some contractions. Pt denies VB, LOF and states positive FM from both babies. Pt states she was diagnosed with gallstones about 3 years ago but has no had any issues in a couple years. VSS. EFM applied.

## 2020-11-15 NOTE — Progress Notes (Signed)
ROB: Patient states that she has had nausea and vomiting ever since her admission last night.  Describes not feeling well at all.  Complains of significant pain in the right upper quadrant.  States that her contractions have improved and she does not have as many but she still has them occasionally.  Reports fetal movement.  Denies bleeding or leakage of fluid.  Last ultrasound showed twins both transverse.  UA last night in L&D negative for UTI.  NST last night = reactive. Abdominal examination reveals pain over her lowest rib.  Possible cartilage issue. Weight loss noted. Plan: Patient has to pick up her children but after that she will go to labor and delivery for IV hydration/antiemetics. LFTs (right upper quadrant pain and nausea and vomiting) Fetal monitoring with toco Covid testing (nausea vomiting generally not feeling well) GBS GC/CT in L&D [Patient rapidly left the office today without rescheduling next appointment]

## 2020-11-15 NOTE — Telephone Encounter (Signed)
Patient's mother called in being very ugly to the front desk receptionist stating "I'm calling in again as no one has called my daughter that just goes to show you the type of crap care you give to your patients". Informed patient I was going to place her on hold and see if my practice administrator was able to talk with her. Called back to CM and she said she was finishing up in a meeting and would be able to speak with them in just a second. Got back on the phone and explained to her that our practice administrator was finishing up in a meeting and that I could place her on hold if she was okay with that for her to speak with CM. Patient was on the phone at this time (I'm assuming they were sitting in the same area and just handed the phone over), and she stated "That's what was supposed to happen yesterday, someone was supposed to be in touch with me yesterday". I informed the patient that no one had told her she would be getting a call back same day and explained that our office is short staffed nurse wise and the nurses that were in yesterday was in the clinic seeing patients and that they may have not had time to review their messages. Patient then goes to say that she has been able to hold nothing down and that she keeps throwing up and has to cook herself like a rotisserie chicken just to close her eyes. Patient stated "Is it just easier for me to come up there?'" Informed patient if she would like she was more than welcome to come in the office and sit down with CM.

## 2020-11-15 NOTE — Final Progress Note (Signed)
L&D OB Triage Note  HPI:  Lacey Bentley is a 30 y.o. [id number] female at [redacted]w[redacted]d. Estimated Date of Delivery: 12/17/20 with di/di twin pregnancy who presents from the office due to complaints of nausea/vomiting, dehydration, and RUQ abdominal pain. She denies fevers or chills.  She does have a history of gallstones. Of note, patient was seen overnight due to similar complaints, was treated with IV fluid bolus and Ambien for sleep. Was noted to be 4 cm dilated yesterday with irregular contractions, however no other signs of active labor present.    OB History  Gravida Para Term Preterm AB Living  10 6 5 1 3 6   SAB IAB Ectopic Multiple Live Births  1 2   0 6    # Outcome Date GA Lbr Len/2nd Weight Sex Delivery Anes PTL Lv  10 Current           9 Term 08/09/17 [redacted]w[redacted]d / 00:04 2820 g F Vag-Vacuum EPI  LIV     Birth Comments: no anomalies noted at delivery  8 Term 07/18/15 [redacted]w[redacted]d / 00:04 3650 g M Vag-Spont   LIV  7 Term 07/26/13 [redacted]w[redacted]d  3402 g F Vag-Spont   LIV  6 Term 12/01/11 [redacted]w[redacted]d  3289 g F Vag-Spont   LIV  5 Term 05/03/10 [redacted]w[redacted]d  3289 g F Vag-Spont   LIV  4 Preterm 03/23/09 [redacted]w[redacted]d  2523 g F Vag-Spont   LIV  3 IAB           2 IAB           1 SAB             Patient Active Problem List   Diagnosis Date Noted  . Nausea and vomiting of pregnancy, antepartum 11/15/2020  . 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 Grand multipara 08/29/2020  . Gastroesophageal reflux in pregnancy 08/29/2020  . Abdominal pain 01/15/2018  . Choledocholithiasis   . Jaundice   . Elevated liver enzymes   . Encounter for induction of labor 08/09/2017  . Late prenatal care 03/15/2017  . Nausea and vomiting during pregnancy prior to [redacted] weeks gestation 03/15/2017  . History of preterm delivery, currently pregnant in second trimester 03/15/2017  . Irregular uterine contractions 07/08/2015    Past Medical History:  Diagnosis Date  . Anxiety   . Depression   . Headache   .  History of preterm delivery    DELIVERED 5 WKS EARLY    No current facility-administered medications on file prior to encounter.   Current Outpatient Medications on File Prior to Encounter  Medication Sig Dispense Refill  . pantoprazole (PROTONIX) 20 MG tablet Take 1 tablet (20 mg total) by mouth daily. 90 tablet 1  . Pediatric Multiple Vit-C-FA (FLINSTONES GUMMIES OMEGA-3 DHA) CHEW Chew by mouth.    . Prenatal Vit-Fe Fum-FA-Omega (ONE-A-DAY WOMENS PRENATAL) 28-0.8 & 440 MG MISC Take by mouth.      Allergies  Allergen Reactions  . Sunscreens Rash     ROS:  Review of Systems - negative for leakage of fluids, vaginal bleeding.   All other review of systems negative except what is noted as pertinent in HPI.    Physical Exam:  Blood pressure 117/76, pulse 85, temperature 98.2 F (36.8 C), temperature source Oral, resp. rate 16, height 5\' 2"  (1.575 m), weight 64.9 kg, last menstrual period 03/12/2020, SpO2 100 %. General appearance: alert and no distress Abdomen: soft, non-tender; bowel sounds normal; no masses,  no  organomegaly Pelvic: external genitalia normal. Cervical exam 4/50/station undetected due to bulging membranes present.  Extremities: extremities normal, atraumatic, no cyanosis or edema Neurologic: grossly normal.    NST INTERPRETATION: Indications: rule out uterine contractions  Fetus A Non-Stress Test Interpretation for 11/15/20   Fetal Heart Rate A Mode: External Baseline Rate (A): 155 bpm (fht, pt returned from bathroom and plugged monitors in backwards, baby A is yellow, baby B is blue for this portion) Variability: Moderate Accelerations: None Decelerations: Variable Multiple birth?: Yes  Uterine Activity Mode: Toco,Palpation Contraction Frequency (min): 1-5 Contraction Duration (sec): 60-80 Contraction Quality: Mild Resting Tone Palpated: Relaxed Resting Time: Adequate    Fetus B Non-Stress Test Interpretation for 11/15/20   Fetal Heart Rate  Fetus B Mode: External Baseline Rate (B): 135 BPM (fht, pt returned from bathroom and plugged monitors in backwards, baby A is yellow, baby B is blue for this portion) Variability: Moderate Accelerations: 15 x 15 Decelerations: None  Uterine Activity Mode: Toco,Palpation Contraction Frequency (min): 1-5 Contraction Duration (sec): 60-80 Contraction Quality: Mild Resting Tone Palpated: Relaxed Resting Time: Adequate   Labs:  Results for orders placed or performed during the hospital encounter of 11/15/20  SARS Coronavirus 2 by RT PCR (hospital order, performed in [facility] hospital lab) Nasopharyngeal Nasopharyngeal Swab   Specimen: Nasopharyngeal Swab  Result Value Ref Range   SARS Coronavirus 2 NEGATIVE NEGATIVE  Chlamydia/NGC rt PCR ([facility] only)   Specimen: Nasopharyngeal Swab; GU  Result Value Ref Range   Specimen source GC/Chlam URINE, RANDOM    Chlamydia Tr NOT DETECTED NOT DETECTED   N gonorrhoeae NOT DETECTED NOT DETECTED  Comprehensive metabolic panel  Result Value Ref Range   Sodium 135 135 - 145 mmol/L   Potassium 3.9 3.5 - 5.1 mmol/L   Chloride 101 98 - 111 mmol/L   CO2 18 (L) 22 - 32 mmol/L   Glucose, Bld 77 70 - 99 mg/dL   BUN <5 (L) 6 - 20 mg/dL   Creatinine, Ser 6.28 0.44 - 1.00 mg/dL   Calcium 8.9 8.9 - 31.5 mg/dL   Total Protein 6.8 6.5 - 8.1 g/dL   Albumin 2.7 (L) 3.5 - 5.0 g/dL   AST 22 15 - 41 U/L   ALT 13 0 - 44 U/L   Alkaline Phosphatase 232 (H) 38 - 126 U/L   Total Bilirubin 1.4 (H) 0.3 - 1.2 mg/dL   GFR, Estimated >17 >61 mL/min   Anion gap 16 (H) 5 - 15  Protein / creatinine ratio, urine  Result Value Ref Range   Creatinine, Urine 79 mg/dL   Total Protein, Urine 21 mg/dL   Protein Creatinine Ratio 0.27 (H) 0.00 - 0.15 mg/mg[Cre]  Urine Drug Screen, Qualitative ([facility] only)  Result Value Ref Range   Tricyclic, Ur Screen NONE DETECTED NONE DETECTED   Amphetamines, Ur Screen NONE DETECTED NONE DETECTED   MDMA (Ecstasy)Ur Screen NONE  DETECTED NONE DETECTED   Cocaine Metabolite,Ur [place] NONE DETECTED NONE DETECTED   Opiate, Ur Screen NONE DETECTED NONE DETECTED   Phencyclidine (PCP) Ur S NONE DETECTED NONE DETECTED   Cannabinoid 50 Ng, Ur [place] NONE DETECTED NONE DETECTED   Barbiturates, Ur Screen NONE DETECTED NONE DETECTED   Benzodiazepine, Ur Scrn NONE DETECTED NONE DETECTED   Methadone Scn, Ur NONE DETECTED NONE DETECTED  CBC with Differential/Platelet  Result Value Ref Range   WBC 15.3 (H) 4.0 - 10.5 K/uL   RBC 3.74 (L) 3.87 - 5.11 MIL/uL   Hemoglobin 11.0 (L) 12.0 -  15.0 g/dL   HCT 16.1 (L) 09.6 - 04.5 %   MCV 90.4 80.0 - 100.0 fL   MCH 29.4 26.0 - 34.0 pg   MCHC 32.5 30.0 - 36.0 g/dL   RDW 40.9 81.1 - 91.4 %   Platelets 278 150 - 400 K/uL   nRBC 0.1 0.0 - 0.2 %   Neutrophils Relative % 79 %   Neutro Abs 12.0 (H) 1.7 - 7.7 K/uL   Lymphocytes Relative 13 %   Lymphs Abs 2.0 0.7 - 4.0 K/uL   Monocytes Relative 7 %   Monocytes Absolute 1.0 0.1 - 1.0 K/uL   Eosinophils Relative 0 %   Eosinophils Absolute 0.0 0.0 - 0.5 K/uL   Basophils Relative 0 %   Basophils Absolute 0.1 0.0 - 0.1 K/uL   Immature Granulocytes 1 %   Abs Immature Granulocytes 0.19 (H) 0.00 - 0.07 K/uL  Type and screen [facility]  Result Value Ref Range   ABO/RH(D) AB POS    Antibody Screen NEG    Sample Expiration      11/18/2020,2359 Performed at [facility], [address] [place], [place] [address]    GBS pending   Imaging:  [place] MFM UA DOPPLER ADDL GEST RE EVAL ----------------------------------------------------------------------  OBSTETRICS REPORT                       (Signed Final 11/14/2020 11:58 am) ---------------------------------------------------------------------- Patient Info  ID #:       [id number]                          D.O.B.:  January 28, 1991 (30 yrs)  Name:       Stevenson Clinch                Visit Date: 11/14/2020 11:29 am               [place] ---------------------------------------------------------------------- Performed By  Attending:        Lin Landsman      Ref. Address:     [facility]                    MD                                                             [facility]                                                             [address]                                                             [address]  [address]                                                             [place]                                                             [address]  Performed By:     Francene Boyers           Location[facility]         [facility]                    RDMS                                     Fetal Care at                                                             [facility]  Referred By:      Linzie Collin MD ---------------------------------------------------------------------- Orders  #  Description                           Code        Ordered By  1  [place] MFM UA DOPPLER ADDL                62130.86    DAVID EVANS     GEST RE EVAL  2  [place] MFM UA CORD DOPPLER                76820.02    DAVID EVANS  3  [place] MFM FETAL BPP WO NST               57846.9     DAVID EVANS     ADDL GESTATION  4  [place] MFM FETAL BPP WO NON               62952.84    DAVID EVANS     STRESS ----------------------------------------------------------------------  #  Order #                     Accession #                Episode #  1  [id number]                   [id number]  2  [id number]                   [id number]  3  [id number]                   [id number]                 [id number]  4  [id number]                   [id number]                 [id number] ---------------------------------------------------------------------- Indications  Twin pregnancy, di/di, third trimester         O30.043  [redacted]  weeks gestation of pregnancy                Z3A.35 ---------------------------------------------------------------------- Fetal Evaluation (Fetus A)  Num Of Fetuses:         2  Fetal Heart Rate(bpm):  147  Cardiac Activity:       Observed  Presentation:           Transverse, head to maternal right  Placenta:               Posterior  P. Cord Insertion:      Previously Visualized                              Largest Pocket(cm)                              6.1 ---------------------------------------------------------------------- Biophysical Evaluation (Fetus A)  Amniotic F.V:   Within normal limits       F. Tone:        Observed  F. Movement:    Observed                   Score:          8/8  F. Breathing:   Observed ---------------------------------------------------------------------- OB History  Gravidity:    10        Term:   5        Prem:   1        SAB:   1  TOP:          2       Ectopic:  0        Living: 6 ---------------------------------------------------------------------- Gestational Age (Fetus A)  Best:          35w 2d     Det. By:  Marcella Dubs         EDD:   12/17/20                                      (05/31/20) ---------------------------------------------------------------------- Doppler - Fetal Vessels (Fetus A)  Umbilical Artery   S/D     %tile                                              ADFV    RDFV   1.99       19  No      No ---------------------------------------------------------------------- Fetal Evaluation (Fetus B)  Num Of Fetuses:         2  Fetal Heart Rate(bpm):  158  Cardiac Activity:       Observed  Presentation:           Transverse, head to maternal left  Placenta:               Anterior  P. Cord Insertion:      Previously Visualized                              Largest Pocket(cm)                               5 ---------------------------------------------------------------------- Biophysical Evaluation (Fetus B)  Amniotic F.V:   Within normal limits       F. Tone:        Observed  F. Movement:    Observed                   Score:          8/8  F. Breathing:   Observed ---------------------------------------------------------------------- Gestational Age (Fetus B)  Best:          35w 2d     Det. ByMarcella Dubs         EDD:   12/17/20                                      (05/31/20) ---------------------------------------------------------------------- Doppler - Fetal Vessels (Fetus B)  Umbilical Artery   S/D     %tile   2.43       49 ---------------------------------------------------------------------- Impression  Antenatal testing for suspected discordant Diamniotic  Dichorionic twin pregnancy  She was evaluated previously with normal growth and  discordancy of 1%.  Today there is normal amniotic fluid, stomach, bladders,  movement and UA Dopplers in Twin A and B.  Twin A is transverse head to maternal right  Twin B is transvers head to maternal left  I conveyed today's findings with Ms. Roussell, we will continue  weekly testing as recommended by Dr. Parke Poisson. ---------------------------------------------------------------------- Recommendations  Continue weekly testing with UA Dopplers.  Consider delivery between 37-38 weeks. ----------------------------------------------------------------------               Lin Landsman, MD Electronically Signed Final Report   11/14/2020 11:58 am ---------------------------------------------------------------------- [place] MFM UA CORD DOPPLER ----------------------------------------------------------------------  OBSTETRICS REPORT                       (Signed Final 11/14/2020 11:58 am) ---------------------------------------------------------------------- Patient Info  ID #:       [id number]                          D.O.B.:  [birth year] (30  yrs)  Name:       Stevenson Clinch                Visit Date: 11/14/2020 11:29 am              [place] ---------------------------------------------------------------------- Performed By  Attending:        Lin Landsman      Ref. Address:     [facility]  MD                                                             [facility]                                                             [address]                                                             [address]                                                             [address]                                                             [place]                                                             [address]  Performed By:     Francene Boyers           Location:         [facility]                    RDMS                                     Fetal Care at                                                             [facility]  Referred By:      Linzie Collin MD ---------------------------------------------------------------------- Orders  #  Description                           Code        Ordered By  1  [place] MFM UA DOPPLER ADDL                [id number]    DAVID EVANS     GEST RE EVAL  2  [place] MFM UA CORD DOPPLER                76820.02    DAVID EVANS  3  [place] MFM FETAL BPP WO NST               76819.1     DAVID EVANS     ADDL GESTATION  4  [place] MFM FETAL BPP WO NON               76819.01    DAVID EVANS     STRESS ----------------------------------------------------------------------  #  Order #                     Accession #                Episode #  1  [id number]                   [id number]                 [id number]  2  [id number]                   [id number]                 [id number]  3  [id number]                   [id number]                 [id number]  4  [id number]                   [id number]                  [id number] ---------------------------------------------------------------------- Indications  Twin pregnancy, di/di, third trimester         O30.043  [redacted] weeks gestation of pregnancy                Z3A.35 ---------------------------------------------------------------------- Fetal Evaluation (Fetus A)  Num Of Fetuses:         2  Fetal Heart Rate(bpm):  147  Cardiac Activity:       Observed  Presentation:           Transverse, head to maternal right  Placenta:               Posterior  P. Cord Insertion:      Previously Visualized                              Largest Pocket(cm)                              6.1 ---------------------------------------------------------------------- Biophysical Evaluation (Fetus A)  Amniotic F.V:   Within normal limits       F. Tone:        Observed  F. Movement:    Observed                   Score:          8/8  F. Breathing:   Observed ---------------------------------------------------------------------- OB History  Gravidity:    10  Term:   5        Prem:   1        SAB:   1  TOP:          2       Ectopic:  0        Living: 6 ---------------------------------------------------------------------- Gestational Age (Fetus A)  Best:          35w 2d     Det. ByMarcella Dubs         EDD:   12/17/20                                      (05/31/20) ---------------------------------------------------------------------- Doppler - Fetal Vessels (Fetus A)  Umbilical Artery   S/D     %tile                                              ADFV    RDFV   1.99       19                                                 No      No ---------------------------------------------------------------------- Fetal Evaluation (Fetus B)  Num Of Fetuses:         2  Fetal Heart Rate(bpm):  158  Cardiac Activity:       Observed  Presentation:           Transverse, head to maternal left  Placenta:               Anterior  P. Cord Insertion:      Previously Visualized                               Largest Pocket(cm)                              5 ---------------------------------------------------------------------- Biophysical Evaluation (Fetus B)  Amniotic F.V:   Within normal limits       F. Tone:        Observed  F. Movement:    Observed                   Score:          8/8  F. Breathing:   Observed ---------------------------------------------------------------------- Gestational Age (Fetus B)  Best:          35w 2d     Det. By:  Marcella Dubs         EDD:   12/17/20                                      (05/31/20) ---------------------------------------------------------------------- Doppler - Fetal Vessels (Fetus B)  Umbilical Artery   S/D     %tile   2.43       49 ---------------------------------------------------------------------- Impression  Antenatal testing for suspected discordant Diamniotic  Dichorionic twin pregnancy  She was evaluated  previously with normal growth and  discordancy of 1%.  Today there is normal amniotic fluid, stomach, bladders,  movement and UA Dopplers in Twin A and B.  Twin A is transverse head to maternal right  Twin B is transvers head to maternal left  I conveyed today's findings with Ms. Victorian, we will continue  weekly testing as recommended by Dr. Parke Poisson. ---------------------------------------------------------------------- Recommendations  Continue weekly testing with UA Dopplers.  Consider delivery between 37-38 weeks. ----------------------------------------------------------------------               Lin Landsman, MD Electronically Signed Final Report   11/14/2020 11:58 am ---------------------------------------------------------------------- [place] MFM FETAL BPP WO NST ADDL GESTATION ----------------------------------------------------------------------  OBSTETRICS REPORT                       (Signed Final 11/14/2020 11:58  am) ---------------------------------------------------------------------- Patient Info  ID #:       [id number]                          D.O.B.:  05-24-1991 (30 yrs)  Name:       Stevenson Clinch                Visit Date: 11/14/2020 11:29 am              [place] ---------------------------------------------------------------------- Performed By  Attending:        Lin Landsman      Ref. Address:     [facility]                    MD                                                             [facility]                                                             [address]                                                             [address]                                                             [address]                                                             [place]  [address]  Performed By:     Francene Boyers           Location:         [facility]                    RDMS                                     Fetal Care at                                                             [facility]  Referred By:      Linzie Collin MD ---------------------------------------------------------------------- Orders  #  Description                           Code        Ordered By  1  [place] MFM UA DOPPLER ADDL                76820.03    DAVID EVANS     GEST RE EVAL  2  [place] MFM UA CORD DOPPLER                76820.02    DAVID EVANS  3  [place] MFM FETAL BPP WO NST               76819.1     DAVID EVANS     ADDL GESTATION  4  [place] MFM FETAL BPP WO NON               76819.01    DAVID EVANS     STRESS ----------------------------------------------------------------------  #  Order #                     Accession #                Episode #  1  [id number]                   [id number]                 [id number]  2  [id number]                   [id number]                 [id number]  3  [id number]                    [id number]                 [id number]  4  [id number]                   [id number]                 [id number] ---------------------------------------------------------------------- Indications  Twin pregnancy, di/di, third trimester         O30.043  [redacted] weeks gestation of pregnancy  Z3A.35 ---------------------------------------------------------------------- Fetal Evaluation (Fetus A)  Num Of Fetuses:         2  Fetal Heart Rate(bpm):  147  Cardiac Activity:       Observed  Presentation:           Transverse, head to maternal right  Placenta:               Posterior  P. Cord Insertion:      Previously Visualized                              Largest Pocket(cm)                              6.1 ---------------------------------------------------------------------- Biophysical Evaluation (Fetus A)  Amniotic F.V:   Within normal limits       F. Tone:        Observed  F. Movement:    Observed                   Score:          8/8  F. Breathing:   Observed ---------------------------------------------------------------------- OB History  Gravidity:    10        Term:   5        Prem:   1        SAB:   1  TOP:          2       Ectopic:  0        Living: 6 ---------------------------------------------------------------------- Gestational Age (Fetus A)  Best:          35w 2d     Det. ByMarcella Dubs         EDD:   12/17/20                                      (05/31/20) ---------------------------------------------------------------------- Doppler - Fetal Vessels (Fetus A)  Umbilical Artery   S/D     %tile                                              ADFV    RDFV   1.99       19                                                 No      No ---------------------------------------------------------------------- Fetal Evaluation (Fetus B)  Num Of Fetuses:         2  Fetal Heart Rate(bpm):  158  Cardiac Activity:       Observed  Presentation:           Transverse, head to  maternal left  Placenta:               Anterior  P. Cord Insertion:      Previously Visualized  Largest Pocket(cm)                              5 ---------------------------------------------------------------------- Biophysical Evaluation (Fetus B)  Amniotic F.V:   Within normal limits       F. Tone:        Observed  F. Movement:    Observed                   Score:          8/8  F. Breathing:   Observed ---------------------------------------------------------------------- Gestational Age (Fetus B)  Best:          35w 2d     Det. ByMarcella Dubs         EDD:   12/17/20                                      (05/31/20) ---------------------------------------------------------------------- Doppler - Fetal Vessels (Fetus B)  Umbilical Artery   S/D     %tile   2.43       49 ---------------------------------------------------------------------- Impression  Antenatal testing for suspected discordant Diamniotic  Dichorionic twin pregnancy  She was evaluated previously with normal growth and  discordancy of 1%.  Today there is normal amniotic fluid, stomach, bladders,  movement and UA Dopplers in Twin A and B.  Twin A is transverse head to maternal right  Twin B is transvers head to maternal left  I conveyed today's findings with Ms. Leppanen, we will continue  weekly testing as recommended by Dr. Parke Poisson. ---------------------------------------------------------------------- Recommendations  Continue weekly testing with UA Dopplers.  Consider delivery between 37-38 weeks. ----------------------------------------------------------------------               Lin Landsman, MD Electronically Signed Final Report   11/14/2020 11:58 am ---------------------------------------------------------------------- [place] MFM FETAL BPP WO NON STRESS ----------------------------------------------------------------------  OBSTETRICS REPORT                       (Signed  Final 11/14/2020 11:58 am) ---------------------------------------------------------------------- Patient Info  ID #:       [id number]                          D.O.B.:  Feb 04, 1991 (30 yrs)  Name:       Stevenson Clinch                Visit Date: 11/14/2020 11:29 am              [place] ---------------------------------------------------------------------- Performed By  Attending:        Lin Landsman      Ref. Address:     [facility]                    MD                                                             [facility]  [address]                                                             [address]                                                             [address]                                                             [place]                                                             [address]  Performed By:     Francene Boyers           [facility]                    RD[facility]                                     [facility]                                                             [facility]  Referred By:      Linzie Collin MD ---------------------------------------------------------------------- Orders  #  Description                           Code        Ordered By  1  [place] MFM UA DOPPLER ADDL                76820.03    DAVID EVANS     GEST RE EVAL  2  [place] MFM UA CORD DOPPLER                76820.02    DAVID EVANS  3  [place] MFM FETAL BPP WO NST               40102.7     DAVID EVANS     ADDL GESTATION  4  [place] MFM FETAL BPP WO NON               25366.44  DAVID EVANS     STRESS ----------------------------------------------------------------------  #  Order #                     Accession #                Episode #  1  [id number]                   [id number]                 [id number]  2  [id number]                   [id number]                 [id number]  3   [id number]                   [id number]                 [id number]  4  [id number]                   [id number]                 [id number] ---------------------------------------------------------------------- Indications  Twin pregnancy, di/di, third trimester         O30.043  [redacted] weeks gestation of pregnancy                Z3A.35 ---------------------------------------------------------------------- Fetal Evaluation (Fetus A)  Num Of Fetuses:         2  Fetal Heart Rate(bpm):  147  Cardiac Activity:       Observed  Presentation:           Transverse, head to maternal right  Placenta:               Posterior  P. Cord Insertion:      Previously Visualized                              Largest Pocket(cm)                              6.1 ---------------------------------------------------------------------- Biophysical Evaluation (Fetus A)  Amniotic F.V:   Within normal limits       F. Tone:        Observed  F. Movement:    Observed                   Score:          8/8  F. Breathing:   Observed ---------------------------------------------------------------------- OB History  Gravidity:    10        Term:   5        Prem:   1        SAB:   1  TOP:          2       Ectopic:  0        Living: 6 ---------------------------------------------------------------------- Gestational Age (Fetus A)  Best:          35w 2d     Det. ByMarcella Dubs         EDD:   12/17/20                                      (  05/31/20) ---------------------------------------------------------------------- Doppler - Fetal Vessels (Fetus A)  Umbilical Artery   S/D     %tile                                              ADFV    RDFV   1.99       19                                                 No      No ---------------------------------------------------------------------- Fetal Evaluation (Fetus B)  Num Of Fetuses:         2  Fetal Heart Rate(bpm):  158  Cardiac Activity:       Observed  Presentation:            Transverse, head to maternal left  Placenta:               Anterior  P. Cord Insertion:      Previously Visualized                              Largest Pocket(cm)                              5 ---------------------------------------------------------------------- Biophysical Evaluation (Fetus B)  Amniotic F.V:   Within normal limits       F. Tone:        Observed  F. Movement:    Observed                   Score:          8/8  F. Breathing:   Observed ---------------------------------------------------------------------- Gestational Age (Fetus B)  Best:          35w 2d     Det. ByMarcella Dubs:  Early Ultrasound         EDD:   12/17/20                                      (05/31/20) ---------------------------------------------------------------------- Doppler - Fetal Vessels (Fetus B)  Umbilical Artery   S/D     %tile   2.43       49 ---------------------------------------------------------------------- Impression  Antenatal testing for suspected discordant Diamniotic  Dichorionic twin pregnancy  She was evaluated previously with normal growth and  discordancy of 1%.  Today there is normal amniotic fluid, stomach, bladders,  movement and UA Dopplers in Twin A and B.  Twin A is transverse head to maternal right  Twin B is transvers head to maternal left  I conveyed today's findings with Ms. [place]allah, we will continue  weekly testing as recommended by Dr. Parke PoissonFang. ---------------------------------------------------------------------- Recommendations  Continue weekly testing with UA Dopplers.  Consider delivery between 37-38 weeks. ----------------------------------------------------------------------               Lin Landsmanorenthian Booker, MD Electronically Signed Final Report   11/14/2020 11:58 am ----------------------------------------------------------------------   Assessment:  30 y.o. [id number] at 4331w3d with:  1.  Preterm uterine contractions 2. Nausea and vomiting 3. History of  gallstones   Plan:  1. Patient treated with IVF bolus, and terbutaline for uterine contractions with dissipation. Notes pain went from 8/10 to 2/10. No further cervical change noted after 5 hrs of monitoring. Cervical remains unchanged since yesterday's exam.  2. Nausea and vomiting with h/o gallstones, remains afebrile.  Was treated with IV Zofran 4 mg x 1 dose and Pepcid 20 mg IV x 1 dose for heartburn. Notes significant improvement in her symptoms. Passed PO challenge. 3. Patient allowed to d/c home.  Strongly encouraged patient to follow up in office next week for next appointment (has had limited [facility] during second trimester and portion of the third trimester). Will need to begin planning for delivery (as at this time both fetuses are noted to be transverse) via C-section. Also has next f/u with MFM scheduled for next week (Tuesday at 10:00 a.m.).  Encouraged bland diet for next several days. Also will send prescription for Zofran and Pepcid to her pharmacy.      Hildred Laser, MD [facility]

## 2020-11-15 NOTE — Discharge Summary (Signed)
Discharge instructions provided to patient. Patient verbalized understanding. Red flag signs and symptoms reviewed by RN. Patient discharged home.

## 2020-11-15 NOTE — Discharge Instructions (Signed)
Preventing Preterm Birth Preterm birth is when a baby is delivered between 20 weeks and 37 weeks of pregnancy. A full-term pregnancy lasts for at least 37 weeks. Preterm birth can be dangerous for your baby because the last few weeks of pregnancy are an important time for your baby to grow and reach a normal birth weight. How can preterm birth affect my baby? Complications of preterm birth may include:  Breathing problems.  Brain damage that affects movement and coordination (cerebral palsy).  Trouble with feeding.  Problems with vision or hearing.  Infections or inflammation of the digestive tract (colitis).  Developmental delays and learning disabilities.  Low birth weight or very low birth weight.  Higher risk for diabetes, heart disease, and high blood pressure later in life. What can increase my risk of having a preterm birth? The exact cause of preterm birth is unknown. The following factors make you more likely to have a preterm birth:  Being diagnosed with placenta previa. This is a condition in which the placenta covers the lowest part of your uterus (cervix), which opens into the vagina.  Certain conditions of your current and past pregnancies, such as: ? Having had a preterm birth before. ? Being pregnant with multiples. ? Waiting less than 6 months between giving birth and becoming pregnant again. ? Certain abnormalities in your unborn baby. ? Vaginal bleeding during pregnancy. ? Becoming pregnant through in vitro fertilization (IVF).  Being overweight or underweight.  Medical history of: ? STIs (sexually transmitted infections) or other infections of the urinary tract and the vagina. ? Long-term (chronic) illnesses, such as blood clotting problems, diabetes, or high blood pressure. ? Short cervix.  Lifestyle and environmental factors, such as: ? Using tobacco products or drugs. ? Drinking alcohol. ? Having stress and no social support. ? Violence in the home  (domestic violence). ? Being exposed to certain chemicals or pollutants in the environment. What actions can I take to prevent preterm birth? Medical care The most important thing you can do to lower your risk for preterm birth is to get routine medical care during pregnancy (prenatal care). Keep all follow-up visits as told by your health care provider. This is important.  If you have a high risk of preterm birth:  You may be referred to a health care provider who specializes in managing high-risk pregnancies (perinatologist).  You may be given medicine to help prevent preterm birth. Lifestyle Certain lifestyle changes can also lower your risk of preterm birth:  Wait at least 6 months after a pregnancy to become pregnant again.  Get to a healthy weight before getting pregnant. If you are overweight, work with your health care provider to safely lose weight.  Do not use any products that contain nicotine or tobacco, such as cigarettes, e-cigarettes, and chewing tobacco. If you need help quitting, ask your health care provider.  Do not drink alcohol.  Do not use drugs.  Eat a healthy diet.  Manage other medical problems, such as diabetes or high blood pressure.   Where to find support For more support, consider:  Talking with your health care provider.  Talking with a therapist or substance abuse counselor, if you need help quitting.  Working with a dietitian or a personal trainer to maintain a healthy weight.  Joining a support group. Where to find more information Learn more about preventing preterm birth from:  Centers for Disease Control and Prevention: cdc.gov  March of Dimes: marchofdimes.org  American Pregnancy Association: americanpregnancy.org Contact a   health care provider if: You have any of the following signs or symptoms of preterm labor before 37 weeks:  A change or increase in vaginal discharge.  Fluid leaking from your vagina.  Pressure or cramps in  your lower abdomen.  A backache that does not go away or gets worse.  Regular tightening (contractions) in your lower abdomen. Get help right away if:  You are having regular painful contractions every 5 minutes or less.  Your water breaks. Summary  Preterm birth means having your baby during weeks 20-37 of pregnancy.  Preterm birth may put your baby at risk for physical and mental problems.  The exact cause of preterm birth is unknown. However, being diagnosed with placenta previa or having vaginal bleeding or an STI (sexually transmitted infection) increases your risk for preterm birth.  Getting good prenatal care can help prevent preterm birth. Keep all follow-up visits as told by your health care provider. This is important.  Contact a health care provider if you have signs or symptoms of preterm labor. This information is not intended to replace advice given to you by your health care provider. Make sure you discuss any questions you have with your health care provider. Document Revised: 09/06/2019 Document Reviewed: 09/06/2019 Elsevier Patient Education  2021 Elsevier Inc.  

## 2020-11-16 LAB — RPR: RPR Ser Ql: NONREACTIVE

## 2020-11-17 LAB — CULTURE, BETA STREP (GROUP B ONLY)

## 2020-11-17 NOTE — Discharge Summary (Signed)
    L&D OB Triage Note  SUBJECTIVE Lacey Bentley is a 30 y.o. [id number] female at [redacted]w[redacted]d, EDD Estimated Date of Delivery: 12/17/20 who presented to triage with  c/o constant right upper abd pain that started 3 days ago and intermediate lower abd pain that is tightening. Denies LOF/bleeding. +FM. No concerns this pregnancy.  OB History  Gravida Para Term Preterm AB Living  10 6 5 1  0 6  SAB IAB Ectopic Multiple Live Births  0 0 0 0 0    # Outcome Date GA Lbr Len/2nd Weight Sex Delivery Anes PTL Lv  10 Current           9 Gravida           8 Gravida           7 Gravida           6 Preterm           5 Term           4 Term           3 Term           2 Term           1 Term             No medications prior to admission.     OBJECTIVE  Nursing Evaluation:   BP 105/67 (BP Location: Left Arm)   Pulse 85   Temp 98 F (36.7 C) (Oral)   Resp 16   Ht 5\' 2"  (1.575 m)   Wt 65.8 kg   BMI 26.52 kg/m    Findings:        Patient was not aware of contractions. -Contractions resolved with IV hydration.     Fetal monitoring-reassuring.     Cervical exam 4 cm. (Patient states with her last baby she spent more than a week at 6 cm before going into labor)     Twins noted at last ultrasound to be transverse.     Right upper quadrant pain seems to be related to ribs and positioning of baby.      NST was performed and has been reviewed by me.  NST INTERPRETATION: Category I  Mode: External Baseline Rate (A): 140 bpm (fht) Variability: Moderate Accelerations: 15 x 15 Decelerations: None     Contraction Frequency (min): 3-8  ASSESSMENT Impression:  1.  Pregnancy:  at [redacted]w[redacted]d , EDD Estimated Date of Delivery: 12/17/20 2.  Reassuring fetal and maternal status 3.  Not in labor.  PLAN 1. Reassurance given.  Patient asked to make an appointment in the next 1 to 2 days. 2. Discharge home with standard labor precautions given to return to L&D or call the office for problems. 3.  Continue routine prenatal care.

## 2020-11-17 NOTE — Telephone Encounter (Signed)
Spoke to pt- Advised I was following up on the phone call from her Mom. Asked pt how I could help her. She seemed in better spirits than last time I spoke to her.   She states that for the last 2 nights she has been able to sleep. Eating some -cracker, pretzel. Drinking H20 or ice. She states when she moves a certain way she is in pain. Her Mother is there helping her.   She thinks she may be constipated. Protocol given. Encouraged clear fluids and BRAT  diet. Reminded pt of follow up appt. Advised her to call us or send my chart message if she needed anything. Pt voiced understanding.

## 2020-11-21 ENCOUNTER — Ambulatory Visit (INDEPENDENT_AMBULATORY_CARE_PROVIDER_SITE_OTHER): Payer: Medicaid Other | Admitting: Obstetrics and Gynecology

## 2020-11-21 ENCOUNTER — Encounter: Payer: Self-pay | Admitting: Obstetrics and Gynecology

## 2020-11-21 ENCOUNTER — Other Ambulatory Visit: Payer: Self-pay

## 2020-11-21 ENCOUNTER — Ambulatory Visit: Payer: Medicaid Other | Attending: Obstetrics

## 2020-11-21 VITALS — BP 107/72 | HR 73 | Ht 62.0 in | Wt 145.9 lb

## 2020-11-21 DIAGNOSIS — Z3A36 36 weeks gestation of pregnancy: Secondary | ICD-10-CM | POA: Diagnosis not present

## 2020-11-21 DIAGNOSIS — O30043 Twin pregnancy, dichorionic/diamniotic, third trimester: Secondary | ICD-10-CM

## 2020-11-21 DIAGNOSIS — O329XX0 Maternal care for malpresentation of fetus, unspecified, not applicable or unspecified: Secondary | ICD-10-CM

## 2020-11-21 DIAGNOSIS — O3093 Multiple gestation, unspecified, third trimester: Secondary | ICD-10-CM

## 2020-11-21 DIAGNOSIS — Z641 Problems related to multiparity: Secondary | ICD-10-CM

## 2020-11-21 LAB — POCT URINALYSIS DIPSTICK OB
Bilirubin, UA: NEGATIVE
Blood, UA: NEGATIVE
Glucose, UA: NEGATIVE
Ketones, UA: NEGATIVE
Nitrite, UA: NEGATIVE
POC,PROTEIN,UA: NEGATIVE
Spec Grav, UA: <=1.005 — AB (ref 1.010–1.025)
Urobilinogen, UA: 0.2 E.U./dL
pH, UA: 7.5 (ref 5.0–8.0)

## 2020-11-21 NOTE — Progress Notes (Signed)
OB-Pt present for routine prenatal care. Pt c/o rib pains and contractions.

## 2020-11-23 ENCOUNTER — Other Ambulatory Visit: Payer: Self-pay | Admitting: Obstetrics and Gynecology

## 2020-11-23 DIAGNOSIS — O30043 Twin pregnancy, dichorionic/diamniotic, third trimester: Secondary | ICD-10-CM

## 2020-11-23 NOTE — Patient Instructions (Signed)
COVID 19 Instructions for Scheduled Procedure (Inductions/C-sections and GYN surgeries)   Thank you for choosing [facility] for your services.  You have been scheduled for a procedure called _____Cesarean Section (primary)_____________.    Your procedure is scheduled on ____Wednesday, February 16, 2022______________.  You are required to have COVID-19 testing performed 2 days prior to your scheduled procedure date.  Testing is performed between 9 AM and 1 PM Monday through Friday.  Please present for testing on ___Monday, February 14, 2022__________ during this hour. Drive up testing is performed in front of [facility] (this is next to the [employer]).    Upon your scheduled procedure date, you will need to arrive at the [facility] entrance. (There is a statue at the front of this entrance.)   Please arrive on time if you are scheduled for an induction of labor.   If you are scheduled for a Cesarean delivery or for Gyn Surgery, arrive 2 hours prior to your procedure time.   If you are an Obstetric patient and your arrival time falls between 11 PM and 6 AM call L&D [phone]) when you arrive.  A staff member will meet you at the [facility] entrance.  At this time, patients are allowed 1 support person to accompany them. Face masks are required for you and your support person. Your support person is now allowed to be there with you during the entire time of your admission.   Please contact the office if you have any questions regarding this information.  The [facility] office number is [phone].     Thank you,    Your [facility] Prov[facility]      Cesarean Delivery Cesarean birth, or cesarean delivery, is the surgical delivery of a baby through an incision in the abdomen and the uterus. This may be referred to as a C-section. This procedure may be scheduled ahead of time, or it may be done in an emergency situation. Tell a health care provider  about:  Any allergies you have.  All medicines you are taking, including vitamins, herbs, eye drops, creams, and over-the-counter medicines.  Any problems you or family members have had with anesthetic medicines.  Any blood disorders you have.  Any surgeries you have had.  Any medical conditions you have.  Whether you or any members of your family have a history of deep vein thrombosis (DVT) or pulmonary embolism (PE). What are the risks? Generally, this is a safe procedure. However, problems may occur, including:  Infection.  Bleeding.  Allergic reactions to medicines.  Damage to other structures or organs.  Blood clots.  Injury to your baby. What happens before the procedure? General instructions  Follow instructions from your health care provider about eating or drinking restrictions.  If you know that you are going to have a cesarean delivery, do not shave your pubic area. Shaving before the procedure may increase your risk of infection.  Plan to have someone take you home from the hospital.  Ask your health care provider what steps will be taken to prevent infection. These may include: ? Removing hair at the surgery site. ? Washing skin with a germ-killing soap. ? Taking antibiotic medicine.  Depending on the reason for your cesarean delivery, you may have a physical exam or additional testing, such as an ultrasound.  You may have your blood or urine tested. Questions for your health care provider  Ask your health care provider about: ? Changing or  stopping your regular medicines. This is especially important if you are taking diabetes medicines or blood thinners. ? Your pain management plan. This is especially important if you plan to breastfeed your baby. ? How long you will be in the hospital after the procedure. ? Any concerns you may have about receiving blood products, if you need them during the procedure. ? Cord blood banking, if you plan to collect  your baby's umbilical cord blood.  You may also want to ask your health care provider: ? Whether you will be able to hold or breastfeed your baby while you are still in the operating room. ? Whether your baby can stay with you immediately after the procedure and during your recovery. ? Whether a family member or a person of your choice can go with you into the operating room and stay with you during the procedure, immediately after the procedure, and during your recovery. What happens during the procedure?  An IV will be inserted into one of your veins.  Fluid and medicines, such as antibiotics, will be given before the surgery.  Fetal monitors will be placed on your abdomen to check your baby's heart rate.  You may be given a special warming gown to wear to keep your temperature stable.  A catheter may be inserted into your bladder through your urethra. This drains your urine during the procedure.  You may be given one or more of the following: ? A medicine to numb the area (local anesthetic). ? A medicine to make you fall asleep (general anesthetic). ? A medicine (regional anesthetic) that is injected into your back or through a small thin tube placed in your back (spinal anesthetic or epidural anesthetic). This numbs everything below the injection site and allows you to stay awake during your procedure. If this makes you feel nauseous, tell your health care provider. Medicines will be available to help reduce any nausea you may feel.  An incision will be made in your abdomen, and then in your uterus.  If you are awake during your procedure, you may feel tugging and pulling in your abdomen, but you should not feel pain. If you feel pain, tell your health care provider immediately.  Your baby will be removed from your uterus. You may feel more pressure or pushing while this happens.  Immediately after birth, your baby will be dried and kept warm. You may be able to hold and breastfeed  your baby.  The umbilical cord may be clamped and cut during this time. This usually occurs after waiting a period of 1-2 minutes after delivery.  Your placenta will be removed from your uterus.  Your incisions will be closed with stitches (sutures). Staples, skin glue, or adhesive strips may also be applied to the incision in your abdomen.  Bandages (dressings) may be placed over the incision in your abdomen. The procedure may vary among health care providers and hospitals.   What happens after the procedure?  Your blood pressure, heart rate, breathing rate, and blood oxygen level will be monitored until you are discharged from the hospital.  You may continue to receive fluids and medicines through an IV.  You will have some pain. Medicines will be available to help control your pain.  To help prevent blood clots: ? You may be given medicines. ? You may have to wear compression stockings or devices. ? You will be encouraged to walk around when you are able.  Hospital staff will encourage and support bonding with  your baby. Your hospital may have you and your baby to stay in the same room (rooming in) during your hospital stay to encourage successful bonding and breastfeeding.  You may be encouraged to cough and breathe deeply often. This helps to prevent lung problems.  If you have a catheter draining your urine, it will be removed as soon as possible after your procedure. Summary  Cesarean birth, or cesarean delivery, is the surgical delivery of a baby through an incision in the abdomen and the uterus.  Follow instructions from your health care provider about eating or drinking restrictions before the procedure.  You will have some pain after the procedure. Medicines will be available to help control your pain.  Hospital staff will encourage and support bonding with your baby after the procedure. Your hospital may have you and your baby to stay in the same room (rooming in)  during your hospital stay to encourage successful bonding and breastfeeding. This information is not intended to replace advice given to you by your health care provider. Make sure you discuss any questions you have with your health care provider. Document Revised: 05/29/2020 Document Reviewed: 04/06/2018 Elsevier Patient Education  2021 [employer].

## 2020-11-23 NOTE — Progress Notes (Signed)
ROB: Patient notes feeling much better after recent triage visit last week for abdominal pain, preterm contractions, and nausea/vomiting.  Had appointment with MFM prior to this appointment, noted that both babies were malpositioned, recommendations for delivery are between 37-38 weeks. Normal BPP and dopplers for both. Discussed possible dates for C-section, patient decided on 2/16. Discussed C-section in detail, also need for COVID testing prior to procedure. Patient notes understanding.    [place] MFM UA DOPPLER ADDL GEST RE EVAL ----------------------------------------------------------------------  OBSTETRICS REPORT                         (Signed Final 11/21/2020 11:02 am) ---------------------------------------------------------------------- Patient Info  ID #:        [id number]                          D.O.B.:  1991/02/13 (30 yrs)  Name:        Lacey Bentley                Visit Date: 11/21/2020 10:33 am               [place] ---------------------------------------------------------------------- Performed By  Attending:         Ma Rings MD         Ref. Address:      [facility]                                                               [address]                                                               [address]                                                               [address]                                                               [place] [address]  Performed By:      Francene Boyers RDMS      Location:          [facility]                                                               [facility]  Referred By:       Linzie Collin MD ---------------------------------------------------------------------- Orders  #   Description                          Code         Ordered By  1   [place]  MFM UA DOPPLER ADDL GEST          76820.03     DAVID EVANS      RE EVAL  2   [place] MFM UA DOPPLER RE-EVAL            76820.04     DAVID EVANS  3   [place] MFM FETAL BPP WO NST ADDL         76819.1      DAVID EVANS      GESTATION  4   [place] MFM FETAL BPP WO NON              76819.01     DAVID EVANS      STRESS ----------------------------------------------------------------------  #   Order #                    Accession #                 Episode #  1   [id number]                  [id number]                  [id number]  2   [id number]                  [id number]                  [id number]  3   [id number]                  [id number]                  [id number]  4   [id number]                  [id number]                  [id number] ---------------------------------------------------------------------- Indications  Twin pregnancy, di/di, third trimester          O30.043  [redacted] weeks gestation of pregnancy                 Z3A.36 ---------------------------------------------------------------------- Fetal Evaluation (Fetus A)  Num Of Fetuses:          2  Fetal Heart Rate(bpm):   140  Cardiac Activity:        Observed  Presentation:            Oblique  Placenta:                Posterior  Amniotic Fluid  AFI FV:  Within normal limits ---------------------------------------------------------------------- Biophysical Evaluation (Fetus A)  Amniotic F.V:   Within normal limits        F. Tone:         Observed  F. Movement:    Observed                    Score:           8/8  F. Breathing:   Observed ---------------------------------------------------------------------- OB History  Gravidity:     10        Term:  5          Prem:  1        SAB:   1  TOP:           2       Ectopic: 0         Living: 6 ---------------------------------------------------------------------- Gestational Age (Fetus A)  Best:           36w 2d    Det. ByMarcella Dubs:  Early Ultrasound           EDD:  12/17/20                                       (05/31/20) ---------------------------------------------------------------------- Doppler - Fetal Vessels (Fetus A)  Umbilical Artery    S/D     %tile                                              ADFV    RDFV   2.38        50                                                 No      No ---------------------------------------------------------------------- Fetal Evaluation (Fetus B)  Num Of Fetuses:          2  Fetal Heart Rate(bpm):   140  Cardiac Activity:        Observed  Presentation:            Cephalic  Placenta:                Anterior  P. Cord Insertion:       Previously Visualized  Amniotic Fluid  AFI FV:      Within normal limits ---------------------------------------------------------------------- Biophysical Evaluation (Fetus B)  Amniotic F.V:   Within normal limits        F. Tone:         Observed  F. Movement:    Observed                    Score:           8/8  F. Breathing:   Observed ---------------------------------------------------------------------- Gestational Age (Fetus B)  Best:           36w 2d    Det. ByMarcella Dubs:  Early Ultrasound           EDD:  12/17/20                                      (  05/31/20) ---------------------------------------------------------------------- Doppler - Fetal Vessels (Fetus B)  Umbilical Artery    S/D     %tile                                              ADFV    RDFV   1.99        22                                                 No      No ---------------------------------------------------------------------- Comments  This patient was seen for a biophysical profile and umbilical  artery Doppler studies due to a dichorionic, diamniotic twin  gestation.  A significant discordance in the fetal weights was  not noted during her last growth ultrasound.  The patient denies  any problems since her last exam.  She reports feeling vigorous  movements of both fetuses throughout the day.  A biophysical profile performed today was 8 out of 8  both twin  A and twin B.  There was normal amniotic fluid noted on today's ultrasound  exam around twin A and twin B.  Doppler studies of the umbilical arteries performed for both twin  A and twin B continues to show normal forward flow.  There  were no signs of absent or reversed end-diastolic flow noted in  either fetus today.  Twin A was in the breech presentation.  Twin B was vertex.  The patient understands that a cesarean delivery may be  necessary should twin A remain in the breech presentation.  She will return in 1 week for another biophysical profile and  umbilical artery Doppler study.  Due to the dichorionic, diamniotic twin gestations, delivery may  be considered at between 37 to 38 weeks. ----------------------------------------------------------------------                    Ma Rings, MD Electronically Signed Final Report   11/21/2020 11:02 am ---------------------------------------------------------------------- [place] MFM UA DOPPLER RE-EVAL ----------------------------------------------------------------------  OBSTETRICS REPORT                         (Signed Final 11/21/2020 11:02 am) ---------------------------------------------------------------------- Patient Info  ID #:        [id number]                          D.O.B.:  06-10-1991 (30 yrs)  Name:        Lacey Bentley                Visit Date: 11/21/2020 10:33 am               [place] ---------------------------------------------------------------------- Performed By  Attending:         Ma Rings MD         Ref. Address:      [facility]                                                               [address]                                                               [address]                                                               [address]                                                               [place] [address]   Performed By:      Francene Boyers RDMS      [facility]                                                               [facility]  Referred By:       Linzie Collin MD ---------------------------------------------------------------------- Orders  #   Description                          Code         Ordered By  1   [place] MFM UA DOPPLER ADDL GEST          76820.03     DAVID EVANS      RE EVAL  2   [place] MFM UA DOPPLER RE-EVAL            76820.04     DAVID EVANS  3   [place] MFM FETAL BPP WO NST ADDL         11914.7      DAVID EVANS      GESTATION  4   [place] MFM FETAL BPP WO NON  81191.47     DAVID EVANS      STRESS ----------------------------------------------------------------------  #   Order #                    Accession #                 Episode #  1   [id number]                  [id number]                  [id number]  2   [id number]                  [id number]                  [id number]  3   [id number]                  [id number]                  [id number]  4   [id number]                  [id number]                  [id number] ---------------------------------------------------------------------- Indications  Twin pregnancy, di/di, third trimester          O30.043  [redacted] weeks gestation of pregnancy                 Z3A.36 ---------------------------------------------------------------------- Fetal Evaluation (Fetus A)  Num Of Fetuses:          2  Fetal Heart Rate(bpm):   140  Cardiac Activity:        Observed  Presentation:            Oblique  Placenta:                Posterior  Amniotic Fluid  AFI FV:      Within normal limits ---------------------------------------------------------------------- Biophysical Evaluation (Fetus A)  Amniotic F.V:   Within normal limits        F. Tone:         Observed  F. Movement:    Observed                    Score:            8/8  F. Breathing:   Observed ---------------------------------------------------------------------- OB History  Gravidity:     10        Term:  5          Prem:  1        SAB:   1  TOP:           2       Ectopic: 0         Living: 6 ---------------------------------------------------------------------- Gestational Age (Fetus A)  Best:           36w 2d    Det. ByMarcella Dubs           EDD:  12/17/20                                      (05/31/20) ---------------------------------------------------------------------- Doppler - Fetal Vessels (Fetus A)  Umbilical Artery    S/D     %tile  ADFV    RDFV   2.38        50                                                 No      No ---------------------------------------------------------------------- Fetal Evaluation (Fetus B)  Num Of Fetuses:          2  Fetal Heart Rate(bpm):   140  Cardiac Activity:        Observed  Presentation:            Cephalic  Placenta:                Anterior  P. Cord Insertion:       Previously Visualized  Amniotic Fluid  AFI FV:      Within normal limits ---------------------------------------------------------------------- Biophysical Evaluation (Fetus B)  Amniotic F.V:   Within normal limits        F. Tone:         Observed  F. Movement:    Observed                    Score:           8/8  F. Breathing:   Observed ---------------------------------------------------------------------- Gestational Age (Fetus B)  Best:           36w 2d    Det. ByMarcella Dubs           EDD:  12/17/20                                      (05/31/20) ---------------------------------------------------------------------- Doppler - Fetal Vessels (Fetus B)  Umbilical Artery    S/D     %tile                                              ADFV    RDFV   1.99        22                                                 No       No ---------------------------------------------------------------------- Comments  This patient was seen for a biophysical profile and umbilical  artery Doppler studies due to a dichorionic, diamniotic twin  gestation.  A significant discordance in the fetal weights was  not noted during her last growth ultrasound.  The patient denies  any problems since her last exam.  She reports feeling vigorous  movements of both fetuses throughout the day.  A biophysical profile performed today was 8 out of 8 both twin  A and twin B.  There was normal amniotic fluid noted on today's ultrasound  exam around twin A and twin B.  Doppler studies of the umbilical arteries performed for both twin  A and twin B continues to show normal forward flow.  There  were no signs of absent or reversed end-diastolic flow noted in  either fetus today.  Twin A  was in the breech presentation.  Twin B was vertex.  The patient understands that a cesarean delivery may be  necessary should twin A remain in the breech presentation.  She will return in 1 week for another biophysical profile and  umbilical artery Doppler study.  Due to the dichorionic, diamniotic twin gestations, delivery may  be considered at between 37 to 38 weeks. ----------------------------------------------------------------------                    Ma Rings, MD Electronically Signed Final Report   11/21/2020 11:02 am ---------------------------------------------------------------------- [place] MFM FETAL BPP WO NST ADDL GESTATION ----------------------------------------------------------------------  OBSTETRICS REPORT                         (Signed Final 11/21/2020 11:02 am) ---------------------------------------------------------------------- Patient Info  ID #:        [id number]                          D.O.B.:  1991-03-15 (30 yrs)  Name:        Lacey Bentley                Visit Date: 11/21/2020 10:33 am                [place] ---------------------------------------------------------------------- Performed By  Attending:         Ma Rings MD         Ref. Address:      [facility]                                                               [address]                                                               [address]                                                               [address]                                                               [place] [address]  Performed By:      Francene Boyers RDMS  [facility]          [facility]                                                               [facility]  Referred By:       Linzie Collin MD ---------------------------------------------------------------------- Orders  #   Description                          Code         Ordered By  1   [place] MFM UA DOPPLER ADDL GEST          76195.09     DAVID EVANS      RE EVAL  2   [place] MFM UA DOPPLER RE-EVAL            76820.04     DAVID EVANS  3   [place] MFM FETAL BPP WO NST ADDL         32671.2      DAVID EVANS      GESTATION  4   [place] MFM FETAL BPP WO NON              76819.01     DAVID EVANS      STRESS ----------------------------------------------------------------------  #   Order #                    Accession #                 Episode #  1   [id number]                  [id number]                  [id number]  2   [id number]                  [id number]                  [id number]  3   [id number]                  [id number]                  [id number]  4   [id number]                  [id number]                  [id number] ---------------------------------------------------------------------- Indications  Twin pregnancy, di/di, third trimester          O30.043  [redacted] weeks gestation of pregnancy                  Z3A.36 ----------------------------------------------------------------------  Fetal Evaluation (Fetus A)  Num Of Fetuses:          2  Fetal Heart Rate(bpm):   140  Cardiac Activity:        Observed  Presentation:            Oblique  Placenta:                Posterior  Amniotic Fluid  AFI FV:      Within normal limits ---------------------------------------------------------------------- Biophysical Evaluation (Fetus A)  Amniotic F.V:   Within normal limits        F. Tone:         Observed  F. Movement:    Observed                    Score:           8/8  F. Breathing:   Observed ---------------------------------------------------------------------- OB History  Gravidity:     10        Term:  5          Prem:  1        SAB:   1  TOP:           2       Ectopic: 0         Living: 6 ---------------------------------------------------------------------- Gestational Age (Fetus A)  Best:           36w 2d    Det. ByMarcella Dubs           EDD:  12/17/20                                      (05/31/20) ---------------------------------------------------------------------- Doppler - Fetal Vessels (Fetus A)  Umbilical Artery    S/D     %tile                                              ADFV    RDFV   2.38        50                                                 No      No ---------------------------------------------------------------------- Fetal Evaluation (Fetus B)  Num Of Fetuses:          2  Fetal Heart Rate(bpm):   140  Cardiac Activity:        Observed  Presentation:            Cephalic  Placenta:                Anterior  P. Cord Insertion:       Previously Visualized  Amniotic Fluid  AFI FV:      Within normal limits ---------------------------------------------------------------------- Biophysical Evaluation (Fetus B)  Amniotic F.V:   Within normal limits        F. Tone:         Observed  F. Movement:    Observed                    Score:  8/8  F. Breathing:    Observed ---------------------------------------------------------------------- Gestational Age (Fetus B)  Best:           36w 2d    Det. ByMarcella Dubs           EDD:  12/17/20                                      (05/31/20) ---------------------------------------------------------------------- Doppler - Fetal Vessels (Fetus B)  Umbilical Artery    S/D     %tile                                              ADFV    RDFV   1.99        22                                                 No      No ---------------------------------------------------------------------- Comments  This patient was seen for a biophysical profile and umbilical  artery Doppler studies due to a dichorionic, diamniotic twin  gestation.  A significant discordance in the fetal weights was  not noted during her last growth ultrasound.  The patient denies  any problems since her last exam.  She reports feeling vigorous  movements of both fetuses throughout the day.  A biophysical profile performed today was 8 out of 8 both twin  A and twin B.  There was normal amniotic fluid noted on today's ultrasound  exam around twin A and twin B.  Doppler studies of the umbilical arteries performed for both twin  A and twin B continues to show normal forward flow.  There  were no signs of absent or reversed end-diastolic flow noted in  either fetus today.  Twin A was in the breech presentation.  Twin B was vertex.  The patient understands that a cesarean delivery may be  necessary should twin A remain in the breech presentation.  She will return in 1 week for another biophysical profile and  umbilical artery Doppler study.  Due to the dichorionic, diamniotic twin gestations, delivery may  be considered at between 37 to 38 weeks. ----------------------------------------------------------------------                    Ma Rings, MD Electronically Signed Final Report   11/21/2020 11:02  am ---------------------------------------------------------------------- [place] MFM FETAL BPP WO NON STRESS ----------------------------------------------------------------------  OBSTETRICS REPORT                         (Signed Final 11/21/2020 11:02 am) ---------------------------------------------------------------------- Patient Info  ID #:        [id number]                          D.O.B.:  09/12/1991 (30 yrs)  Name:        Lacey Bentley                Visit Date: 11/21/2020 10:33 am               [place] ---------------------------------------------------------------------- Performed By  Attending:         Ma Rings MD         Ref. Address:      [facility]                                                               [address]                                                               [address]                                                               [address]                                                               [place] [address]  Performed By:      Francene Boyers RDMS      [facility]          [facility]                                                               [facility]  Referred By:       Linzie Collin MD ---------------------------------------------------------------------- Orders  #   Description                          Code         Ordered By  1   [place] MFM UA DOPPLER ADDL GEST  16109.60     DAVID EVANS      RE EVAL  2   [place] MFM UA DOPPLER RE-EVAL            76820.04     DAVID EVANS  3   [place] MFM FETAL BPP WO NST ADDL         76819.1      DAVID EVANS      GESTATION  4   [place] MFM FETAL BPP WO NON              76819.01     DAVID EVANS      STRESS ----------------------------------------------------------------------  #   Order #                    Accession #                  Episode #  1   [id number]                  [id number]  2   [id number]                  [id number]                  [id number]  3   [id number]                  [id number]                  [id number]  4   [id number]                  [id number]                  [id number] ---------------------------------------------------------------------- Indications  Twin pregnancy, di/di, third trimester          O30.043  [redacted] weeks gestation of pregnancy                 Z3A.36 ---------------------------------------------------------------------- Fetal Evaluation (Fetus A)  Num Of Fetuses:          2  Fetal Heart Rate(bpm):   140  Cardiac Activity:        Observed  Presentation:            Oblique  Placenta:                Posterior  Amniotic Fluid  AFI FV:      Within normal limits ---------------------------------------------------------------------- Biophysical Evaluation (Fetus A)  Amniotic F.V:   Within normal limits        F. Tone:         Observed  F. Movement:    Observed                    Score:           8/8  F. Breathing:   Observed ---------------------------------------------------------------------- OB History  Gravidity:     10        Term:  5          Prem:  1        SAB:   1  TOP:           2       Ectopic: 0         Living: 6 ---------------------------------------------------------------------- Gestational Age (Fetus A)  Best:           36w 2d    Det. ByMarcella Dubs           EDD:  12/17/20                                      (05/31/20) ---------------------------------------------------------------------- Doppler - Fetal Vessels (Fetus A)  Umbilical Artery    S/D     %tile                                              ADFV    RDFV   2.38        50                                                 No      No ---------------------------------------------------------------------- Fetal Evaluation (Fetus B)  Num Of Fetuses:          2  Fetal  Heart Rate(bpm):   140  Cardiac Activity:        Observed  Presentation:            Cephalic  Placenta:                Anterior  P. Cord Insertion:       Previously Visualized  Amniotic Fluid  AFI FV:      Within normal limits ---------------------------------------------------------------------- Biophysical Evaluation (Fetus B)  Amniotic F.V:   Within normal limits        F. Tone:         Observed  F. Movement:    Observed                    Score:           8/8  F. Breathing:   Observed ---------------------------------------------------------------------- Gestational Age (Fetus B)  Best:           36w 2d    Det. By:  Marcella Dubs           EDD:  12/17/20                                      (05/31/20) ---------------------------------------------------------------------- Doppler - Fetal Vessels (Fetus B)  Umbilical Artery    S/D     %tile                                              ADFV    RDFV   1.99        22                                                 No      No ---------------------------------------------------------------------- Comments  This patient was seen for a  biophysical profile and umbilical  artery Doppler studies due to a dichorionic, diamniotic twin  gestation.  A significant discordance in the fetal weights was  not noted during her last growth ultrasound.  The patient denies  any problems since her last exam.  She reports feeling vigorous  movements of both fetuses throughout the day.  A biophysical profile performed today was 8 out of 8 both twin  A and twin B.  There was normal amniotic fluid noted on today's ultrasound  exam around twin A and twin B.  Doppler studies of the umbilical arteries performed for both twin  A and twin B continues to show normal forward flow.  There  were no signs of absent or reversed end-diastolic flow noted in  either fetus today.  Twin A was in the breech presentation.  Twin B was vertex.  The patient understands  that a cesarean delivery may be  necessary should twin A remain in the breech presentation.  She will return in 1 week for another biophysical profile and  umbilical artery Doppler study.  Due to the dichorionic, diamniotic twin gestations, delivery may  be considered at between 37 to 38 weeks. ----------------------------------------------------------------------                    Ma Rings, MD Electronically Signed Final Report   11/21/2020 11:02 am ----------------------------------------------------------------------

## 2020-11-24 ENCOUNTER — Other Ambulatory Visit: Payer: Self-pay

## 2020-11-24 ENCOUNTER — Inpatient Hospital Stay
Admission: EM | Admit: 2020-11-24 | Discharge: 2020-11-26 | DRG: 786 | Disposition: A | Discharge status: Home / Self Care | Payer: Medicaid Other | Attending: Obstetrics and Gynecology | Admitting: Obstetrics and Gynecology

## 2020-11-24 ENCOUNTER — Encounter
Admission: EM | Disposition: A | Discharge status: Home / Self Care | Payer: Self-pay | Source: Home / Self Care | Attending: Obstetrics and Gynecology

## 2020-11-24 ENCOUNTER — Inpatient Hospital Stay: Payer: Medicaid Other | Admitting: Certified Registered"

## 2020-11-24 ENCOUNTER — Encounter: Payer: Self-pay | Admitting: Obstetrics and Gynecology

## 2020-11-24 DIAGNOSIS — O321XX1 Maternal care for breech presentation, fetus 1: Principal | ICD-10-CM

## 2020-11-24 DIAGNOSIS — R1084 Generalized abdominal pain: Secondary | ICD-10-CM | POA: Diagnosis not present

## 2020-11-24 DIAGNOSIS — G8918 Other acute postprocedural pain: Secondary | ICD-10-CM | POA: Diagnosis not present

## 2020-11-24 DIAGNOSIS — O26893 Other specified pregnancy related conditions, third trimester: Secondary | ICD-10-CM | POA: Diagnosis not present

## 2020-11-24 DIAGNOSIS — Z3A36 36 weeks gestation of pregnancy: Secondary | ICD-10-CM

## 2020-11-24 DIAGNOSIS — O30043 Twin pregnancy, dichorionic/diamniotic, third trimester: Secondary | ICD-10-CM | POA: Diagnosis present

## 2020-11-24 DIAGNOSIS — U071 COVID-19: Secondary | ICD-10-CM | POA: Diagnosis not present

## 2020-11-24 DIAGNOSIS — O30049 Twin pregnancy, dichorionic/diamniotic, unspecified trimester: Secondary | ICD-10-CM

## 2020-11-24 DIAGNOSIS — O9852 Other viral diseases complicating childbirth: Secondary | ICD-10-CM | POA: Diagnosis not present

## 2020-11-24 DIAGNOSIS — O4690 Antepartum hemorrhage, unspecified, unspecified trimester: Secondary | ICD-10-CM | POA: Diagnosis present

## 2020-11-24 DIAGNOSIS — O42013 Preterm premature rupture of membranes, onset of labor within 24 hours of rupture, third trimester: Secondary | ICD-10-CM

## 2020-11-24 DIAGNOSIS — O98519 Other viral diseases complicating pregnancy, unspecified trimester: Secondary | ICD-10-CM

## 2020-11-24 HISTORY — DX: Unspecified asthma, uncomplicated: J45.909

## 2020-11-24 HISTORY — DX: Twin pregnancy, dichorionic/diamniotic, unspecified trimester: O30.049

## 2020-11-24 LAB — CBC WITH DIFFERENTIAL/PLATELET
Abs Immature Granulocytes: 0.41 10*3/uL — ABNORMAL HIGH (ref 0.00–0.07)
Basophils Absolute: 0.1 10*3/uL (ref 0.0–0.1)
Basophils Relative: 1 %
Eosinophils Absolute: 0.1 10*3/uL (ref 0.0–0.5)
Eosinophils Relative: 1 %
HCT: 30.5 % — ABNORMAL LOW (ref 36.0–46.0)
Hemoglobin: 10.1 g/dL — ABNORMAL LOW (ref 12.0–15.0)
Immature Granulocytes: 4 %
Lymphocytes Relative: 27 %
Lymphs Abs: 2.6 10*3/uL (ref 0.7–4.0)
MCH: 28.7 pg (ref 26.0–34.0)
MCHC: 33.1 g/dL (ref 30.0–36.0)
MCV: 86.6 fL (ref 80.0–100.0)
Monocytes Absolute: 0.7 10*3/uL (ref 0.1–1.0)
Monocytes Relative: 7 %
Neutro Abs: 5.9 10*3/uL (ref 1.7–7.7)
Neutrophils Relative %: 60 %
Platelets: 295 10*3/uL (ref 150–400)
RBC: 3.52 MIL/uL — ABNORMAL LOW (ref 3.87–5.11)
RDW: 13.9 % (ref 11.5–15.5)
WBC: 9.6 10*3/uL (ref 4.0–10.5)
nRBC: 1.7 % — ABNORMAL HIGH (ref 0.0–0.2)

## 2020-11-24 LAB — RESP PANEL BY RT-PCR (FLU A&B, COVID) ARPGX2
Influenza A by PCR: NEGATIVE
Influenza B by PCR: NEGATIVE
SARS Coronavirus 2 by RT PCR: POSITIVE — AB

## 2020-11-24 LAB — TYPE AND SCREEN
ABO/RH(D): AB POS
Antibody Screen: NEGATIVE

## 2020-11-24 LAB — ABO/RH: ABO/RH(D): AB POS

## 2020-11-24 SURGERY — Surgical Case
Anesthesia: Spinal

## 2020-11-24 MED ORDER — NALBUPHINE HCL 10 MG/ML IJ SOLN: 5.0000 mg | Freq: Once | INTRAMUSCULAR | Status: DC | PRN

## 2020-11-24 MED ORDER — MORPHINE SULFATE (PF) 0.5 MG/ML IJ SOLN
INTRAMUSCULAR | Status: AC
  Filled 2020-11-24: qty 10

## 2020-11-24 MED ORDER — ONDANSETRON HCL 4 MG/2ML IJ SOLN
4.0000 mg | Freq: Three times a day (TID) | INTRAMUSCULAR | Status: DC | PRN
  Administered 2020-11-24: 4 mg via INTRAVENOUS
  Filled 2020-11-24: qty 2

## 2020-11-24 MED ORDER — ZOLPIDEM TARTRATE 5 MG PO TABS: 5.0000 mg | ORAL_TABLET | Freq: Every evening | ORAL | Status: DC | PRN

## 2020-11-24 MED ORDER — FENTANYL CITRATE (PF) 100 MCG/2ML IJ SOLN
INTRAMUSCULAR | Status: AC
  Filled 2020-11-24: qty 2

## 2020-11-24 MED ORDER — NALOXONE HCL 4 MG/10ML IJ SOLN
1.0000 ug/kg/h | INTRAVENOUS | Status: DC | PRN
  Filled 2020-11-24: qty 5

## 2020-11-24 MED ORDER — OXYCODONE-ACETAMINOPHEN 5-325 MG PO TABS
1.0000 | ORAL_TABLET | ORAL | Status: DC | PRN
Start: 2020-11-24 — End: 2020-11-26
  Administered 2020-11-25 – 2020-11-26 (×3): 1 via ORAL
  Filled 2020-11-24 (×2): qty 1
  Filled 2020-11-24: qty 2

## 2020-11-24 MED ORDER — MENTHOL 3 MG MT LOZG
1.0000 | LOZENGE | OROMUCOSAL | Status: DC | PRN
Start: 2020-11-24 — End: 2020-11-26
  Filled 2020-11-24: qty 9

## 2020-11-24 MED ORDER — SEVOFLURANE IN SOLN
RESPIRATORY_TRACT | Status: AC
  Filled 2020-11-24: qty 250

## 2020-11-24 MED ORDER — LIDOCAINE 5 % EX PTCH
MEDICATED_PATCH | CUTANEOUS | Status: DC | PRN
  Administered 2020-11-24: 1 via TRANSDERMAL

## 2020-11-24 MED ORDER — METHYLERGONOVINE MALEATE 0.2 MG/ML IJ SOLN
INTRAMUSCULAR | Status: AC
  Filled 2020-11-24: qty 1

## 2020-11-24 MED ORDER — NALBUPHINE HCL 10 MG/ML IJ SOLN: 5.0000 mg | INTRAMUSCULAR | Status: DC | PRN

## 2020-11-24 MED ORDER — SODIUM CHLORIDE 0.9% FLUSH: 3.0000 mL | INTRAVENOUS | Status: DC | PRN

## 2020-11-24 MED ORDER — DIPHENHYDRAMINE HCL 50 MG/ML IJ SOLN: 12.5000 mg | INTRAMUSCULAR | Status: DC | PRN

## 2020-11-24 MED ORDER — PRENATAL MULTIVITAMIN CH
1.0000 | ORAL_TABLET | Freq: Every day | ORAL | Status: DC
  Administered 2020-11-24 – 2020-11-25 (×2): 1 via ORAL
  Filled 2020-11-24 (×2): qty 1

## 2020-11-24 MED ORDER — KETOROLAC TROMETHAMINE 30 MG/ML IJ SOLN
30.0000 mg | Freq: Four times a day (QID) | INTRAMUSCULAR | Status: DC
  Administered 2020-11-24 – 2020-11-25 (×3): 30 mg via INTRAVENOUS
  Filled 2020-11-24 (×4): qty 1

## 2020-11-24 MED ORDER — MORPHINE SULFATE (PF) 0.5 MG/ML IJ SOLN
INTRAMUSCULAR | Status: DC | PRN
  Administered 2020-11-24: .1 mg via INTRATHECAL

## 2020-11-24 MED ORDER — PROPOFOL 10 MG/ML IV BOLUS
INTRAVENOUS | Status: AC
  Filled 2020-11-24: qty 20

## 2020-11-24 MED ORDER — ONDANSETRON HCL 4 MG/2ML IJ SOLN
INTRAMUSCULAR | Status: DC | PRN
Start: 2020-11-24 — End: 2020-11-24
  Administered 2020-11-24: 4 mg via INTRAVENOUS

## 2020-11-24 MED ORDER — SCOPOLAMINE 1 MG/3DAYS TD PT72
1.0000 | MEDICATED_PATCH | Freq: Once | TRANSDERMAL | Status: DC
  Administered 2020-11-24: 1.5 mg via TRANSDERMAL
  Filled 2020-11-24: qty 1

## 2020-11-24 MED ORDER — DEXAMETHASONE SODIUM PHOSPHATE 10 MG/ML IJ SOLN
INTRAMUSCULAR | Status: DC | PRN
  Administered 2020-11-24: 10 mg via INTRAVENOUS

## 2020-11-24 MED ORDER — PHENYLEPHRINE HCL (PRESSORS) 10 MG/ML IV SOLN
INTRAVENOUS | Status: DC | PRN
  Administered 2020-11-24: 100 ug via INTRAVENOUS

## 2020-11-24 MED ORDER — IBUPROFEN 800 MG PO TABS
800.0000 mg | ORAL_TABLET | Freq: Three times a day (TID) | ORAL | Status: DC
  Administered 2020-11-25 – 2020-11-26 (×4): 800 mg via ORAL
  Filled 2020-11-24 (×4): qty 1

## 2020-11-24 MED ORDER — LACTATED RINGERS IV SOLN: INTRAVENOUS | Status: DC

## 2020-11-24 MED ORDER — CEFAZOLIN SODIUM-DEXTROSE 2-4 GM/100ML-% IV SOLN
2.0000 g | INTRAVENOUS | Status: AC
  Administered 2020-11-24: 2 g via INTRAVENOUS
  Filled 2020-11-24: qty 100

## 2020-11-24 MED ORDER — LACTATED RINGERS IV BOLUS
1000.0000 mL | Freq: Once | INTRAVENOUS | Status: AC
  Administered 2020-11-24: 1000 mL via INTRAVENOUS

## 2020-11-24 MED ORDER — DEXAMETHASONE SODIUM PHOSPHATE 10 MG/ML IJ SOLN
INTRAMUSCULAR | Status: AC
  Filled 2020-11-24: qty 1

## 2020-11-24 MED ORDER — MORPHINE SULFATE (PF) 0.5 MG/ML IJ SOLN: INTRAMUSCULAR | Status: DC | PRN

## 2020-11-24 MED ORDER — OXYTOCIN-SODIUM CHLORIDE 30-0.9 UT/500ML-% IV SOLN
INTRAVENOUS | Status: DC | PRN
  Administered 2020-11-24 (×2): 30 [IU] via INTRAVENOUS

## 2020-11-24 MED ORDER — LIDOCAINE 5 % EX PTCH
MEDICATED_PATCH | CUTANEOUS | Status: AC
  Filled 2020-11-24: qty 1

## 2020-11-24 MED ORDER — ACETAMINOPHEN 500 MG PO TABS
1000.0000 mg | ORAL_TABLET | Freq: Four times a day (QID) | ORAL | Status: AC
  Administered 2020-11-24 – 2020-11-25 (×4): 1000 mg via ORAL
  Filled 2020-11-24 (×5): qty 2

## 2020-11-24 MED ORDER — FENTANYL CITRATE (PF) 100 MCG/2ML IJ SOLN
INTRAMUSCULAR | Status: DC | PRN
  Administered 2020-11-24: 15 ug via INTRATHECAL

## 2020-11-24 MED ORDER — DIPHENHYDRAMINE HCL 25 MG PO CAPS: 25.0000 mg | ORAL_CAPSULE | ORAL | Status: DC | PRN

## 2020-11-24 MED ORDER — SENNOSIDES-DOCUSATE SODIUM 8.6-50 MG PO TABS
2.0000 | ORAL_TABLET | ORAL | Status: DC
  Administered 2020-11-24 – 2020-11-25 (×2): 2 via ORAL
  Filled 2020-11-24 (×2): qty 2

## 2020-11-24 MED ORDER — DIPHENHYDRAMINE HCL 25 MG PO CAPS: 25.0000 mg | ORAL_CAPSULE | Freq: Four times a day (QID) | ORAL | Status: DC | PRN

## 2020-11-24 MED ORDER — BUPIVACAINE IN DEXTROSE 0.75-8.25 % IT SOLN
INTRATHECAL | Status: DC | PRN
  Administered 2020-11-24: 1.6 mL via INTRATHECAL

## 2020-11-24 MED ORDER — SIMETHICONE 80 MG PO CHEW
80.0000 mg | CHEWABLE_TABLET | Freq: Four times a day (QID) | ORAL | Status: DC
  Administered 2020-11-24 – 2020-11-26 (×6): 80 mg via ORAL
  Filled 2020-11-24 (×7): qty 1

## 2020-11-24 MED ORDER — LIDOCAINE HCL (PF) 1 % IJ SOLN
INTRAMUSCULAR | Status: DC | PRN
  Administered 2020-11-24: 3 mL

## 2020-11-24 MED ORDER — MEPERIDINE HCL 50 MG/ML IJ SOLN: 6.2500 mg | INTRAMUSCULAR | Status: DC | PRN

## 2020-11-24 MED ORDER — NALOXONE HCL 0.4 MG/ML IJ SOLN: 0.4000 mg | INTRAMUSCULAR | Status: DC | PRN

## 2020-11-24 MED ORDER — SOD CITRATE-CITRIC ACID 500-334 MG/5ML PO SOLN
30.0000 mL | ORAL | Status: AC
  Administered 2020-11-24: 30 mL via ORAL
  Filled 2020-11-24: qty 15

## 2020-11-24 MED ORDER — CARBOPROST TROMETHAMINE 250 MCG/ML IM SOLN
INTRAMUSCULAR | Status: AC
  Filled 2020-11-24: qty 1

## 2020-11-24 MED ORDER — GLYCOPYRROLATE 0.2 MG/ML IJ SOLN
INTRAMUSCULAR | Status: DC | PRN
  Administered 2020-11-24 (×2): .1 mg via INTRAVENOUS

## 2020-11-24 MED ORDER — OXYTOCIN-SODIUM CHLORIDE 30-0.9 UT/500ML-% IV SOLN
INTRAVENOUS | Status: AC
  Filled 2020-11-24: qty 1000

## 2020-11-24 MED ORDER — ONDANSETRON HCL 4 MG/2ML IJ SOLN
INTRAMUSCULAR | Status: AC
  Filled 2020-11-24: qty 2

## 2020-11-24 MED ORDER — SODIUM CHLORIDE 0.9 % IV SOLN
INTRAVENOUS | Status: DC | PRN
  Administered 2020-11-24: 40 ug/min via INTRAVENOUS

## 2020-11-24 MED ORDER — OXYTOCIN-SODIUM CHLORIDE 30-0.9 UT/500ML-% IV SOLN
2.5000 [IU]/h | INTRAVENOUS | Status: DC
  Filled 2020-11-24: qty 500

## 2020-11-24 SURGICAL SUPPLY — 28 items
ADHESIVE MASTISOL STRL (MISCELLANEOUS) ×2 IMPLANT
BAG COUNTER SPONGE EZ (MISCELLANEOUS) ×2 IMPLANT
CANISTER SUCT 3000ML PPV (MISCELLANEOUS) ×2 IMPLANT
CHLORAPREP W/TINT 26 (MISCELLANEOUS) ×4 IMPLANT
COVER WAND RF STERILE (DRAPES) ×2 IMPLANT
DRESSING TELFA 8X3 (GAUZE/BANDAGES/DRESSINGS) ×2 IMPLANT
DRSG TELFA 3X8 NADH (GAUZE/BANDAGES/DRESSINGS) ×2 IMPLANT
GAUZE SPONGE 4X4 12PLY STRL (GAUZE/BANDAGES/DRESSINGS) ×2 IMPLANT
GLOVE BIOGEL PI ORTHO PRO 7.5 (GLOVE) ×1
GLOVE PI ORTHO PRO STRL 7.5 (GLOVE) ×1 IMPLANT
GOWN STRL REUS W/ TWL LRG LVL3 (GOWN DISPOSABLE) ×2 IMPLANT
GOWN STRL REUS W/TWL LRG LVL3 (GOWN DISPOSABLE) ×2
KIT TURNOVER KIT A (KITS) ×2 IMPLANT
MANIFOLD NEPTUNE II (INSTRUMENTS) ×2 IMPLANT
MAT PREVALON FULL STRYKER (MISCELLANEOUS) ×2 IMPLANT
NS IRRIG 1000ML POUR BTL (IV SOLUTION) ×2 IMPLANT
PACK C SECTION AR (MISCELLANEOUS) ×2 IMPLANT
PAD OB MATERNITY 4.3X12.25 (PERSONAL CARE ITEMS) ×2 IMPLANT
PAD PREP 24X41 OB/GYN DISP (PERSONAL CARE ITEMS) ×2 IMPLANT
PENCIL SMOKE ULTRAEVAC 22 CON (MISCELLANEOUS) ×2 IMPLANT
RETRACTOR WND ALEXIS-O 25 LRG (MISCELLANEOUS) ×1 IMPLANT
RTRCTR WOUND ALEXIS O 25CM LRG (MISCELLANEOUS) ×2
SPONGE GAUZE 4X4 FOR O.R. (GAUZE/BANDAGES/DRESSINGS) ×2 IMPLANT
SPONGE LAP 18X18 RF (DISPOSABLE) ×2 IMPLANT
SUT VIC AB 0 CTX 36 (SUTURE) ×2
SUT VIC AB 0 CTX36XBRD ANBCTRL (SUTURE) ×2 IMPLANT
SUT VIC AB 1 CT1 36 (SUTURE) ×4 IMPLANT
SUT VICRYL+ 3-0 36IN CT-1 (SUTURE) ×4 IMPLANT

## 2020-11-24 NOTE — Anesthesia Preprocedure Evaluation (Addendum)
Anesthesia Evaluation  Patient identified by MRN, date of birth, ID band Patient awake    Reviewed: Allergy & Precautions, H&P , NPO status , Patient's Chart, lab work & pertinent test results  Airway Mallampati: III  TM Distance: >3 FB Neck ROM: full    Dental  (+) Chipped, Poor Dentition   Pulmonary asthma , Current Smoker,    breath sounds clear to auscultation       Cardiovascular Exercise Tolerance: Good (-) hypertensionnegative cardio ROS   Rhythm:regular Rate:Normal     Neuro/Psych    GI/Hepatic negative GI ROS,   Endo/Other    Renal/GU   negative genitourinary   Musculoskeletal   Abdominal   Peds  Hematology negative hematology ROS (+)   Anesthesia Other Findings Twin gestation Presented ruptured and contracting for urgent C/S.  Past Medical History: No date: Asthma     Comment:  Childhood  Past Surgical History: No date: NO PAST SURGERIES  BMI    Body Mass Index: 26.52 kg/m      Reproductive/Obstetrics (+) Pregnancy                            Anesthesia Physical Anesthesia Plan  ASA: III  Anesthesia Plan: Spinal   Post-op Pain Management:    Induction:   PONV Risk Score and Plan:   Airway Management Planned: Nasal Cannula  Additional Equipment:   Intra-op Plan:   Post-operative Plan:   Informed Consent: I have reviewed the patients History and Physical, chart, labs and discussed the procedure including the risks, benefits and alternatives for the proposed anesthesia with the patient or authorized representative who has indicated his/her understanding and acceptance.     Dental Advisory Given  Plan Discussed with: Anesthesiologist  Anesthesia Plan Comments:         Anesthesia Quick Evaluation

## 2020-11-24 NOTE — Op Note (Signed)
OP NOTE  Date: 11/24/2020   10:41 AM Name Lacey Bentley MR# [id number]  Preoperative Diagnosis: 1. Intrauterine pregnancy at [redacted]w[redacted]d Active Problems:   Vaginal bleeding during pregnancy, antepartum  2.  SROM 3.  Malpresentation of Baby A (Breech)  Postoperative Diagnosis: 1. Intrauterine pregnancy at [redacted]w[redacted]d, delivered 2. Viable infant 3. Remainder same as pre-op   Procedure: 1. Primary Low-Transverse Cesarean Section  Surgeon: Elonda Husky, MD  Assistant:  Valentino Saxon MD No other capable assistant was available for this surgery which requires an experienced, high level assistant.  She provided exposure, dissection, suctioning, retraction, and general support and assistance during the procedure.   Anesthesia:    Bluma, Buresh [[id number]]  Spinal    Ersa, Delaney [[id number]]       EBLDorota, Heinrichs [[id number]]  [address] PendingBabyB [[id number]]  725   ml     Findings: 1) female/female infant, Apgar scores of    Muriah, Harsha [[id number]]      Alexsia, Klindt [[id number]]      at 1 minute and    Lalana, Wachter [[id number]]      Alton, PendingBabyB [[id number]]      at 5 minutes and a birthweight of    Rochelle, Larue [[id number]]  87.48    Broxson, PendingBabyB [[id number]]  93.83   ounces.    2) Normal uterus, tubes and ovaries.    Procedure:  The patient was prepped and draped in the supine position and placed under spinal anesthesia.  A transverse incision was made across the abdomen in a Pfannenstiel manner. If indicated the old scar was systematically removed with sharp dissection.  We carried the dissection down to the level of the fascia.  The fascia was incised in a curvilinear manner.  The fascia was then elevated from the rectus muscles with blunt and sharp dissection.  The rectus muscles were separated laterally exposing the peritoneum.  The peritoneum was carefully entered with care  being taken to avoid bowel and bladder.  A self-retaining retractor was placed.  The visceral peritoneum was incised in a curvilinear fashion across the lower uterine segment creating a bladder flap. A transverse incision was made across the lower uterine segment and extended laterally and superiorly using finger dissection. Baby A was delivered from the breech position.  A body cord was present. After an appropriate time interval, the cord was doubly clamped and cut. The infant was handed to the pediatric personnel  who then placed the infant under heat lamps where she was cleaned dried and suctioned as needed. AROM - clear fluid - for Baby B.  This baby was delivered from the vertex presentation.The placentas were delivered. The hysterotomy incision was then identified on ring forceps.  The uterine cavity was cleaned with a moist lap sponge.  The hysterotomy incision was closed with a running interlocking suture of Vicryl.  A second layer was place imbricating the first. Hemostasis was excellent.  Pitocin was run in the IV and the uterus was found to be firm. The posterior cul-de-sac and gutters were cleaned and inspected.  Hemostasis was noted.  The fascia was then closed with a running suture of #1 Vicryl.  Hemostasis of the subcutaneous tissues was obtained using the Bovie.  The subcutaneous tissues were closed with a running suture of 000 Vicryl.  A subcuticular suture was placed.  Steri-Strips were applied in the usual manner.  A pressure dressing was placed.  The patient went  to the recovery room in stable condition.   Elonda Husky, M.D. 11/24/2020 10:41 AM

## 2020-11-24 NOTE — Discharge Instructions (Signed)
Discharge Instructions:  ° °Follow-up Appointment:  ° °If there are any new medications, they have been ordered and will be available for pickup at the listed pharmacy on your way home from the hospital.  ° °Call office if you have any of the following: headache, visual changes, fever >101.0 F, chills, shortness of breath, breast concerns, excessive vaginal bleeding, incision drainage or problems, leg pain or redness, depression or any other concerns. If you have vaginal discharge with an odor, let your doctor know.  ° °It is normal to bleed for up to 6 weeks. You should not soak through more than 1 pad in 1 hour. If you have a blood clot larger than your fist with continued bleeding, call your doctor.  ° °After a c-section, you should expect a small amount of blood or clear fluid coming from the incision and abdominal cramping/soreness. Inspect your incision site daily. Stand in front of a mirror to look for any redness, incision opening, or discolored/odorness drainage. Take a shower daily and continue good hygiene. Use own towel and washcloth (do not share). Make sure your sheets on your bed are clean. No pets sleeping around your incision site. Dressing will be removed at your postpartum visit. If the dressing does become wet or soiled underneath, it is okay to remove it.  ° °On-Q pump: You will remove on day 5 after insertion or if the ball becomes flat before day 5. You will remove on: Feb 14, 2019 ° °Activity: Do not lift > 10 lbs for 6 weeks (do not lift anything heavier than your baby). °No intercourse, tampons, swimming pools, hot tubs, baths (only showers) for 6 weeks.  °No driving for 1-2 weeks. °Continue prenatal vitamin, especially if breastfeeding. °Increase calories and fluids (water) while breastfeeding.  ° °Your milk will come in, in the next couple of days (right now it is colostrum). You may have a slight fever when your milk comes in, but it should go away on its own.  If it does not, and rises  above 101 F please call the doctor. You will also feel achy and your breasts will be firm. They will also start to leak. If you are breastfeeding, continue as you have been and you can pump/express milk for comfort.  ° °If you have too much milk, your breasts can become engorged, which could lead to mastitis. This is an infection of the milk ducts. It can be very painful and you will need to notify your doctor to obtain a prescription for antibiotics. You can also treat it with a shower or hot/cold compress.  ° °For concerns about your baby, please call your pediatrician.  °For breastfeeding concerns, the lactation consultant can be reached at [phone].  ° °Postpartum blues (feelings of happy one minute and sad another minute) are normal for the first few weeks but if it gets worse let your doctor know.  ° °Congratulations! We enjoyed caring for you and your new bundle of joy!  °

## 2020-11-24 NOTE — OB Triage Note (Signed)
Patient placed on EFM and toco to non tender area of abdomen and stated that when alarm clock went off at 0530 she went to the restroom and noted bloody clear discharge flowing into the toilet. She then proceeded to hospital for further eval. Patient self reports that she was 4cm dilated when she was last checked in the office. Medical history reviewed and patient notified that physician will be in to further assess the bleeding.

## 2020-11-24 NOTE — Transfer of Care (Signed)
Immediate Anesthesia Transfer of Care Note  Patient: Lacey Bentley  Procedure(s) Performed: CESAREAN SECTION MULTI-GESTATIONAL (N/A )  Patient Location: PACU and Mother/Baby  Anesthesia Type:Spinal  Level of Consciousness: awake, alert  and oriented  Airway & Oxygen Therapy: Patient Spontanous Breathing  Post-op Assessment: Report given to RN and Post -op Vital signs reviewed and stable  Post vital signs: Reviewed and stable  Last Vitals:  Vitals Value Taken Time  BP 102/60   Temp    Pulse 60   Resp 14   SpO2 99     Last Pain:  Vitals:   11/24/20 0747  TempSrc:   PainSc: 0-No pain         Complications: No complications documented.

## 2020-11-24 NOTE — H&P (Signed)
History and Physical   HPI  Lacey Bentley is a 30 y.o. [id number] at 106w5d Estimated Date of Delivery: 12/17/20 who is being admitted with c/o SROM and contractions.  Pregnancy with Mercie Eon twins.   OB History  OB History  Gravida Para Term Preterm AB Living  10 6 5 1  0 6  SAB IAB Ectopic Multiple Live Births  0 0 0 0 0    # Outcome Date GA Lbr Len/2nd Weight Sex Delivery Anes PTL Lv  10 Current           9 Gravida           8 Gravida           7 Gravida           6 Preterm           5 Term           4 Term           3 Term           2 Term           1 Term             PROBLEM LIST  Pregnancy complications or risks: Patient Active Problem List   Diagnosis Date Noted  . Vaginal bleeding during pregnancy, antepartum 11/24/2020  . Indication for care or intervention related to labor and delivery 11/14/2020    Prenatal labs and studies: ABO, Rh: --/--/PENDING (02/11 09-16-2000) Antibody: PENDING (02/11 0739) Rubella:   RPR:    HBsAg:    HIV:    GBS:    Past Medical History:  Diagnosis Date  . Asthma    Childhood     Past Surgical History:  Procedure Laterality Date  . NO PAST SURGERIES       Medications    Current Discharge Medication List    CONTINUE these medications which have NOT CHANGED   Details  famotidine (PEPCID) 20 MG tablet Take 20 mg by mouth at bedtime.    ondansetron (ZOFRAN) 4 MG tablet Take 4 mg by mouth every 8 (eight) hours as needed for nausea or vomiting.    Prenatal Vit-Fe Fumarate-FA (MULTIVITAMIN-PRENATAL) 27-0.8 MG TABS tablet Take 1 tablet by mouth daily at 12 noon.         Allergies  Sunscreens  Review of Systems  Pertinent items noted in HPI and remainder of comprehensive ROS otherwise negative.  Physical Exam  BP 104/77 (BP Location: Right Arm)   Pulse 78   Temp 98.1 F (36.7 C) (Oral)   Resp 18   Ht 5\' 2"  (1.575 m)   Wt 65.8 kg   BMI 26.52 kg/m   Lungs:  CTA B Cardio: RRR without M/R/G Abd: Soft,  gravid, NT Presentation: Baby A breech - Baby B vertex (confirmed by 09-16-2000 and vaginal exam) EXT: No C/C/ 1+ Edema DTRs: 2+ B CERVIX:  4cm/70% Grossly ROM - clear fluid  COVID - POSTIVE  (asymptomatic)     FHR:  Variability: Good {> 6 bpm)  Toco: Uterine Contractions: q 3-5 MIN  Test Results  Results for orders placed or performed during the hospital encounter of 11/24/20 (from the past 24 hour(s))  Resp Panel by RT-PCR (Flu A&B, Covid) Nasopharyngeal Swab     Status: Abnormal   Collection Time: 11/24/20  7:24 AM   Specimen: Nasopharyngeal Swab; Nasopharyngeal(NP) swabs in vial transport medium  Result Value Ref Range   SARS  Coronavirus 2 by RT PCR POSITIVE (A) NEGATIVE   Influenza A by PCR NEGATIVE NEGATIVE   Influenza B by PCR NEGATIVE NEGATIVE  CBC with Differential/Platelet     Status: Abnormal   Collection Time: 11/24/20  7:25 AM  Result Value Ref Range   WBC 9.6 4.0 - 10.5 K/uL   RBC 3.52 (L) 3.87 - 5.11 MIL/uL   Hemoglobin 10.1 (L) 12.0 - 15.0 g/dL   HCT 60.6 (L) 30.1 - 60.1 %   MCV 86.6 80.0 - 100.0 fL   MCH 28.7 26.0 - 34.0 pg   MCHC 33.1 30.0 - 36.0 g/dL   RDW 09.3 23.5 - 57.3 %   Platelets 295 150 - 400 K/uL   nRBC 1.7 (H) 0.0 - 0.2 %   Neutrophils Relative % 60 %   Neutro Abs 5.9 1.7 - 7.7 K/uL   Lymphocytes Relative 27 %   Lymphs Abs 2.6 0.7 - 4.0 K/uL   Monocytes Relative 7 %   Monocytes Absolute 0.7 0.1 - 1.0 K/uL   Eosinophils Relative 1 %   Eosinophils Absolute 0.1 0.0 - 0.5 K/uL   Basophils Relative 1 %   Basophils Absolute 0.1 0.0 - 0.1 K/uL   Immature Granulocytes 4 %   Abs Immature Granulocytes 0.41 (H) 0.00 - 0.07 K/uL  Type and screen [facility]     Status: None (Preliminary result)   Collection Time: 11/24/20  7:39 AM  Result Value Ref Range   ABO/RH(D) PENDING    Antibody Screen PENDING    Sample Expiration      11/27/2020,2359 Performed at [facility], [address], [place], [place] [address]       Assessment   [id number] at [redacted]w[redacted]d Estimated Date of Delivery: 12/17/20  The babies are reassuring.   Patient Active Problem List   Diagnosis Date Noted  . Vaginal bleeding during pregnancy, antepartum 11/24/2020  . Indication for care or intervention related to labor and delivery 11/14/2020    Plan  1. Admit to L&D :   2. EFM: -- Category 1 3. Plan for CD 4. Admission labs  5. Peds notified  Elonda Husky, M.D. 11/24/2020 8:47 AM

## 2020-11-24 NOTE — OB Triage Note (Signed)
Patient in Obs 2 getting dressed. While getting undressed and placing on blue gown, RN noticed a Small clot and bloody fluid noted leaking from vagina. Dr. Clayburn Pert telephoned and notified by SBAR report that patient has arrived. Logan Bores, MD was asked to come evaluate patient. MD noted on phone that patient fetuses may not be vertex. MD gave verbal order to place patient on EFM and toco and review medical history. Shift Coordinator, Press photographer, and ST notified of present situation.

## 2020-11-24 NOTE — Anesthesia Procedure Notes (Signed)
Spinal  Patient location during procedure: OR Start time: 11/24/2020 9:15 AM End time: 11/24/2020 9:22 AM Staffing Performed: resident/CRNA  Anesthesiologist: Karleen Hampshire, MD Resident/CRNA: Jeanine Luz, CRNA Preanesthetic Checklist Completed: patient identified, IV checked, site marked, risks and benefits discussed, surgical consent, monitors and equipment checked, pre-op evaluation and timeout performed Spinal Block Patient position: sitting Prep: ChloraPrep Patient monitoring: heart rate, cardiac monitor, continuous pulse ox and blood pressure Approach: midline Location: L3-4 Injection technique: single-shot Needle Needle type: Sprotte  Needle gauge: 24 G Needle length: 9 cm Assessment Sensory level: T4 Additional Notes Clear CSF. No paresthesia.

## 2020-11-25 DIAGNOSIS — G8918 Other acute postprocedural pain: Secondary | ICD-10-CM | POA: Diagnosis not present

## 2020-11-25 DIAGNOSIS — R1084 Generalized abdominal pain: Secondary | ICD-10-CM | POA: Diagnosis not present

## 2020-11-25 LAB — RPR: RPR Ser Ql: NONREACTIVE

## 2020-11-25 MED ORDER — ACETAMINOPHEN 500 MG PO TABS
1000.0000 mg | ORAL_TABLET | Freq: Three times a day (TID) | ORAL | Status: DC
  Administered 2020-11-26: 1000 mg via ORAL
  Filled 2020-11-25 (×2): qty 2

## 2020-11-25 NOTE — Anesthesia Post-op Follow-up Note (Signed)
  Anesthesia Pain Follow-up Note  Patient: Lacey Bentley  Day #: 1  Date of Follow-up: 11/25/2020 Time: 8:13 AM  Last Vitals:  Vitals:   11/24/20 2330 11/25/20 0450  BP: 98/62 123/85  Pulse: (!) 53 (!) 45  Resp: 18 18  Temp: 36.8 C 36.9 C  SpO2: 98% 99%    Level of Consciousness: alert  Pain: mild   Side Effects:None  Catheter Site Exam:no drainage     Plan: D/C from anesthesia care at surgeon's request  Rana Adorno K

## 2020-11-25 NOTE — Anesthesia Postprocedure Evaluation (Signed)
Anesthesia Post Note  Patient: Lacey Bentley  Procedure(s) Performed: CESAREAN SECTION MULTI-GESTATIONAL (N/A )  Patient location during evaluation: Mother Baby Anesthesia Type: Spinal Level of consciousness: oriented and awake and alert Pain management: pain level controlled Vital Signs Assessment: post-procedure vital signs reviewed and stable Respiratory status: spontaneous breathing and respiratory function stable Cardiovascular status: stable Postop Assessment: no headache, no backache, no apparent nausea or vomiting and able to ambulate Anesthetic complications: no   No complications documented.   Last Vitals:  Vitals:   11/24/20 2330 11/25/20 0450  BP: 98/62 123/85  Pulse: (!) 53 (!) 45  Resp: 18 18  Temp: 36.8 C 36.9 C  SpO2: 98% 99%    Last Pain:  Vitals:   11/25/20 0450  TempSrc: Oral  PainSc: 8                  Lovada Barwick K

## 2020-11-25 NOTE — Progress Notes (Signed)
Progress Note - Cesarean Delivery  Lacey Bentley is a 30 y.o. [id number] now PP day 1 s/p    Lacey Bentley, Lacey Bentley Girl Lacey Bentley [[id number]]  C-Section, Low Transverse    Lacey Bentley, Lacey Bentley Girl Lacey Bentley [[id number]]  C-Section, Low Transverse  .   Subjective:  Patient reports no problems with eating, bowel movements, voiding, or their wound  OOB without problem  Pain controlled  Objective:  Vital signs in last 24 hours: Temp:  [97.6 F (36.4 C)-98.4 F (36.9 C)] 98.4 F (36.9 C) (02/12 0450) Pulse Rate:  [40-91] 45 (02/12 0450) Resp:  [9-35] 18 (02/12 0450) BP: (93-123)/(60-93) 123/85 (02/12 0450) SpO2:  [93 %-100 %] 99 % (02/12 0450)  Physical Exam:  General: alert and cooperative Lochia: appropriate Uterine Fundus: firm Incision: Dressing intact    Data Review Recent Labs    11/24/20 0725  HGB 10.1*  HCT 30.5*    Assessment:  Active Problems:   Vaginal bleeding during pregnancy, antepartum   Twin pregnancy, twins dichorionic and diamniotic   Status post Cesarean section. Doing well postoperatively.     Plan:       Continue current care.  Probable discharge tomorrow.  Elonda Husky, M.D. 11/25/2020 9:42 AM

## 2020-11-26 DIAGNOSIS — O98519 Other viral diseases complicating pregnancy, unspecified trimester: Secondary | ICD-10-CM

## 2020-11-26 DIAGNOSIS — U071 COVID-19: Secondary | ICD-10-CM

## 2020-11-26 HISTORY — DX: COVID-19: U07.1

## 2020-11-26 HISTORY — DX: Other viral diseases complicating pregnancy, unspecified trimester: O98.519

## 2020-11-26 MED ORDER — OXYCODONE-ACETAMINOPHEN 5-325 MG PO TABS: 1.0000 | ORAL_TABLET | ORAL | 0 refills | Status: DC | PRN

## 2020-11-26 MED ORDER — IBUPROFEN 800 MG PO TABS: 800.0000 mg | ORAL_TABLET | Freq: Three times a day (TID) | ORAL | 0 refills | Status: DC

## 2020-11-26 NOTE — Discharge Summary (Signed)
Physician Obstetric Discharge Summary  Patient Name: Lacey Bentley DOB: [birth year] MRN: [id number]                            Discharge Summary  Date of Admission: 11/24/2020 Date of Discharge: 11/26/2020 Delivering Provider:    Blythe Stanford Girl A Marshawn [[id number]]  Tangi Shroff Harrel Lemon, Baby Girl ADDYLYNN BALIN [[id number]]  Chanoch Mccleery Fayrene Fearing    Admitting Diagnosis: Vaginal bleeding during pregnancy, antepartum [O46.90] Malpresentation before onset of labor, fetus 1 of multiple gestation [O32.9XX1] at [redacted]w[redacted]d Secondary diagnosis:  Active Problems:   Vaginal bleeding during pregnancy, antepartum   Twin pregnancy, twins dichorionic and diamniotic   COVID-19 affecting pregnancy, antepartum   Mode of Delivery:       low uterine, transverse     Discharge diagnosis: Term Pregnancy Delivered      Intrapartum Procedures:    Post partum procedures:   Complications: Covid positive                     Discharge Day SOAP Note:  Subjective:  The patient has no complaints.  She is ambulating well. She is taking PO well. Pain is well controlled with current medications. Patient is urinating without difficulty.   She is passing flatus.  Desires discharge  Objective  Vital signs: BP 111/76 (BP Location: Left Arm)   Pulse (!) 57   Temp 98.6 F (37 C) (Oral)   Resp 16   Ht 5\' 2"  (1.575 m)   Wt 65.8 kg   SpO2 100%   Breastfeeding Unknown   BMI 26.52 kg/m   Physical Exam: Gen: NAD Abdomen:  clean, dry dressing intact (honeycomb) Fundus Fundal Tone: Firm  Lochia Amount: Scant     Data Review Labs: Lab Results  Component Value Date   WBC 9.6 11/24/2020   HGB 10.1 (L) 11/24/2020   HCT 30.5 (L) 11/24/2020   MCV 86.6 11/24/2020   PLT 295 11/24/2020   CBC Latest Ref Rng & Units 11/24/2020  WBC 4.0 - 10.5 K/uL 9.6  Hemoglobin 12.0 - 15.0 g/dL 10.1(L)  Hematocrit 36.0 - 46.0 % 30.5(L)  Platelets 150 - 400 K/uL 295   AB POS  Edinburgh Score: Edinburgh  Postnatal Depression Scale Screening Tool 11/25/2020  I have been able to laugh and see the funny side of things. 1  I have looked forward with enjoyment to things. 0  I have blamed myself unnecessarily when things went wrong. 3  I have been anxious or worried for no good reason. 3  I have felt scared or panicky for no good reason. 2  Things have been getting on top of me. 2  I have been so unhappy that I have had difficulty sleeping. 2  I have felt sad or miserable. 2  I have been so unhappy that I have been crying. 1  The thought of harming myself has occurred to me. 0  Edinburgh Postnatal Depression Scale Total 16    Assessment:  Active Problems:   Vaginal bleeding during pregnancy, antepartum   Twin pregnancy, twins dichorionic and diamniotic   COVID-19 affecting pregnancy, antepartum   Doing well.  Normal progress as expected.  Plan:  Discharge to home  Modified rest as directed - may slowly resume normal activities with restrictions  as discussed.  Medications as written.  See below for additional.      Discharge Instructions: Per After  Visit Summary. Activity: Advance as tolerated. Pelvic rest for 6 weeks.  Also refer to After Visit Summary.  Wound care discussed. Diet: Regular Medications: Allergies as of 11/26/2020      Reactions   Sunscreens Rash      Medication List    STOP taking these medications   famotidine 20 MG tablet Commonly known as: PEPCID   ondansetron 4 MG tablet Commonly known as: ZOFRAN     TAKE these medications   ibuprofen 800 MG tablet Commonly known as: ADVIL Take 1 tablet (800 mg total) by mouth every 8 (eight) hours.   multivitamin-prenatal 27-0.8 MG Tabs tablet Take 1 tablet by mouth daily at 12 noon.   oxyCODONE-acetaminophen 5-325 MG tablet Commonly known as: PERCOCET/ROXICET Take 1-2 tablets by mouth every 4 (four) hours as needed for moderate pain.      Outpatient follow up:   Follow-up Information    Linzie Collin, MD Follow up in 1 week(s).   Specialties: Obstetrics and Gynecology, Radiology Contact information: [address] [place] [address] [phone]              Postpartum contraception: Will discuss at first post-partum visit.  Discharged Condition: good  Discharged to: home  Newborn Data: Disposition:home with mother  Apgars: APGAR (1 MIN):    [place], [place] Girl MAUDY YONAN [[id number]]  [address] CHERYAL SALAS [[id number]]  8    APGAR (5 MINS):    Swan, [place] Girl SHEKITA BOYDEN [[id number]]  [address] LYSSA HACKLEY [[id number]]  9    APGAR (10 MINS):    Norman, [place] Girl LORIELLE BOEHNING [[id number]]     Jlyn, [place] Girl ABIMBOLA AKI [[id number]]      Baby Feeding: Bottle  Elonda Husky, M.D. 11/26/2020 9:48 AM

## 2020-11-26 NOTE — Progress Notes (Signed)
Pt complains of burning at incision site. Overall, she states her pain is well controlled, soreness/cramping is minimal. Pt currently taking 1 percocet PRN for breakthrough pain only. Does not wish to increase to 2 tabs due to sedation, side effects. Discussed alternative options of lidocaine patch, abdominal binder, ice pack. Pt agreeable to lidocaine patch, will request order from MD at rounds. Verified pharmacy information with patient.  Pt preparing for D/C to home today.

## 2020-11-26 NOTE — Clinical Social Work Maternal (Signed)
CLINICAL SOCIAL WORK MATERNAL/CHILD NOTE  Patient Details  Name: Lacey Bentley MRN: [id number] Date of Birth: April 06, 1991  Date:  11/26/2020  Clinical Social Worker Initiating Note:  Positive Lacey Bentley. Mother of 6 children and with current birth, twins, 8 children in house hold 50, 59, 83, 36, 18, and 29 years old. Date/Time: Initiated:  11/26/20/1121     Child's Name:      Biological Parents:  Mother   Need for Interpreter:  None   Reason for Referral:  Behavioral Health Concerns   Address:  [place] [address]    Phone number:  [phone] (home)     Additional phone number: none  Household Members/Support Persons (HM/SP):   Household Member/Support Person 1,Household Member/Support Person 2   HM/SP Name Relationship DOB or Age  HM/SP -1 Lacey Bentley Mother of patient    HM/SP -2 Lacey Bentley Sister    HM/SP -3        HM/SP -4        HM/SP -5        HM/SP -6        HM/SP -7        HM/SP -8          Natural Supports (not living in the home):  [place]   Professional Supports:     Employment: Unemployed   Type of Work:     Education:  9 to 11 years   Homebound arranged: Yes  Financial Resources:  Medicaid   Other Resources:  [facility]   Cultural/Religious Considerations Which May Impact Care:  none identified  Strengths:  Ability to meet basic needs ,Compliance with medical plan ,Home prepared for child ,Pediatrician chosen   Psychotropic Medications:         Pediatrician:    [employer]  Pediatrician List:   [place]      Pediatrician Fax Number:    Risk Factors/Current Problems:  Scientist, product/process development State:  [place]    Mood/Affect:  Other (Comment) (Unable to assess due to telephonic consult)   CSW Assessment: Telephonic assessment.  CSW met with patient  due to positive [name] and lack or minimal resources. CSW explain HIPPA and explained to the patient reason for the consult.  Patient is a 30 year old, single mother of 8 children, 61, 63, 65, 7, 40, 3 and newborn twins. Patient reported that she lives alone with her five children but has the support of her mother and sister. Patient reported that she will be staying with her mother and sister for the first two weeks. Her mother and sister will render the help she needs for herself and her 8 children. Noted that she has all that she needs for her children.   Risk factors identified: Single parent of 8 young children with limited resources. Risk factors mitigated due to patient having adequate support system and a plan in place.  She noted that she will be residing with her 8 children with mother and sister for the first two weeks.   Denied current depress symptoms. Stated that her home is prepared for her newborn and has adequate help.    Psychoeducation about Postpartum depression. SW explained to the patient that feelings or thoughts of self-harm and harming her baby, extreme fear about baby's wellbeing, not accepting help from support, not wanting to  engage in activities or leave the home, not wanting to care for baby, or feeling you are the only one who can care for baby are all symptoms of postpartum depression. SW encouraged mother of baby to asked for help/purport by consulting her primary care provider and or talking to her supports about what is happening.  Postpartum: first 2 weeks after delivery emotions may be up and down due hormones rebalancing. If symptoms occur after that 2 week it could be PPD. Seek help from your PCP or county behavioral health provider.   CSW provided information on [place] offer health, social support and educational services for parents of newborn children. CSW explained to the patient that a registered nurse will come to her home to offer  counsel her baby's health and care; follow up on newborn screening; and arrange additional appointments for the infant if necessary.  Patient declined services and noted that "I don't understand why that is necessary." CSW explained to the patient that this is an elected service. [facility] [employer], [phone].   CSW Plan/Description:  No Further Intervention Required/No Barriers to Discharge    Berenice Bouton, LCSW 11/26/2020, 11:58 AM

## 2020-11-26 NOTE — Progress Notes (Signed)
Reviewed D/C instructions with pt and family. Pt verbalized understanding of teaching. Discharged to home via W/C. Pt to schedule f/u appt.  

## 2020-11-27 ENCOUNTER — Other Ambulatory Visit: Admission: RE | Admit: 2020-11-27 | Payer: Medicaid Other | Source: Ambulatory Visit

## 2020-11-27 ENCOUNTER — Inpatient Hospital Stay: Admission: RE | Admit: 2020-11-27 | Payer: Self-pay | Source: Ambulatory Visit

## 2020-11-27 ENCOUNTER — Encounter: Payer: Self-pay | Admitting: Obstetrics and Gynecology

## 2020-11-28 ENCOUNTER — Encounter: Payer: Self-pay | Admitting: Certified Registered"

## 2020-11-28 ENCOUNTER — Institutional Professional Consult (permissible substitution): Payer: Medicaid Other

## 2020-11-28 ENCOUNTER — Other Ambulatory Visit: Payer: Medicaid Other

## 2020-11-28 ENCOUNTER — Encounter: Payer: Medicaid Other | Admitting: Obstetrics and Gynecology

## 2020-11-29 ENCOUNTER — Encounter: Admission: RE | Payer: Self-pay | Source: Home / Self Care

## 2020-11-29 ENCOUNTER — Ambulatory Visit
Admission: RE | Admit: 2020-11-29 | Payer: Medicaid Other | Source: Home / Self Care | Admitting: Obstetrics and Gynecology

## 2020-11-29 SURGERY — Surgical Case
Anesthesia: Spinal

## 2020-12-01 ENCOUNTER — Ambulatory Visit: Payer: Medicaid Other | Admitting: Obstetrics and Gynecology

## 2020-12-05 ENCOUNTER — Encounter: Payer: Self-pay | Admitting: Obstetrics and Gynecology

## 2020-12-12 DIAGNOSIS — Z419 Encounter for procedure for purposes other than remedying health state, unspecified: Secondary | ICD-10-CM | POA: Diagnosis not present

## 2021-01-12 DIAGNOSIS — Z419 Encounter for procedure for purposes other than remedying health state, unspecified: Secondary | ICD-10-CM | POA: Diagnosis not present

## 2021-02-11 DIAGNOSIS — Z419 Encounter for procedure for purposes other than remedying health state, unspecified: Secondary | ICD-10-CM | POA: Diagnosis not present

## 2021-03-14 DIAGNOSIS — Z419 Encounter for procedure for purposes other than remedying health state, unspecified: Secondary | ICD-10-CM | POA: Diagnosis not present

## 2021-04-13 DIAGNOSIS — Z419 Encounter for procedure for purposes other than remedying health state, unspecified: Secondary | ICD-10-CM | POA: Diagnosis not present

## 2021-05-02 ENCOUNTER — Encounter: Payer: Self-pay | Admitting: Obstetrics and Gynecology

## 2021-05-02 ENCOUNTER — Ambulatory Visit (INDEPENDENT_AMBULATORY_CARE_PROVIDER_SITE_OTHER): Payer: Medicaid Other | Admitting: Obstetrics and Gynecology

## 2021-05-02 ENCOUNTER — Other Ambulatory Visit: Payer: Self-pay

## 2021-05-02 VITALS — BP 112/73 | HR 75 | Ht 62.0 in | Wt 117.9 lb

## 2021-05-02 DIAGNOSIS — Z30011 Encounter for initial prescription of contraceptive pills: Secondary | ICD-10-CM

## 2021-05-02 MED ORDER — LEVONORGEST-ETH ESTRAD 91-DAY 0.15-0.03 &0.01 MG PO TABS: 1.0000 | ORAL_TABLET | Freq: Every day | ORAL | 1 refills | Status: DC

## 2021-05-02 NOTE — Progress Notes (Signed)
HPI:      Lacey Bentley. L[place]felter is a 30 y.o. [id number] who LMP was No LMP recorded.  Subjective:   She presents today because she would like to discuss birth control methods.  She is primarily interested in OCPs.  She states she has taken OCPs before. Patient does smoke cigarettes. She recently had a cesarean delivery and reports that she is doing well although she did not present for follow-up postpartum.    Hx: The following portions of the patient's history were reviewed and updated as appropriate:             She  has a past medical history of Anxiety, Asthma, Depression, Headache, and History of preterm delivery. She does not have any pertinent problems on file. She  has a past surgical history that includes Endoscopic retrograde cholangiopancreatography (ercp) with propofol (N/A, 01/15/2018); No past surgeries; and Cesarean section multi-gestational (N/A, 11/24/2020). Her family history includes Hypertension in her father. She  reports that she has been smoking. She has a 1.00 pack-year smoking history. She has never used smokeless tobacco. She reports previous alcohol use. She reports that she does not use drugs. She has a current medication list which includes the following prescription(s): ferrous sulfate, ibuprofen, levonorgestrel-ethinyl estradiol, multivitamin-prenatal, famotidine, ondansetron, and oxycodone-acetaminophen. She is allergic to sunscreens and sunscreens.       Review of Systems:  Review of Systems  Constitutional: Denied constitutional symptoms, night sweats, recent illness, fatigue, fever, insomnia and weight loss.  Eyes: Denied eye symptoms, eye pain, photophobia, vision change and visual disturbance.  Ears/Nose/Throat/Neck: Denied ear, nose, throat or neck symptoms, hearing loss, nasal discharge, sinus congestion and sore throat.  Cardiovascular: Denied cardiovascular symptoms, arrhythmia, chest pain/pressure, edema, exercise intolerance, orthopnea and palpitations.   Respiratory: Denied pulmonary symptoms, asthma, pleuritic pain, productive sputum, cough, dyspnea and wheezing.  Gastrointestinal: Denied, gastro-esophageal reflux, melena, nausea and vomiting.  Genitourinary: Denied genitourinary symptoms including symptomatic vaginal discharge, pelvic relaxation issues, and urinary complaints.  Musculoskeletal: Denied musculoskeletal symptoms, stiffness, swelling, muscle weakness and myalgia.  Dermatologic: Denied dermatology symptoms, rash and scar.  Neurologic: Denied neurology symptoms, dizziness, headache, neck pain and syncope.  Psychiatric: Denied psychiatric symptoms, anxiety and depression.  Endocrine: Denied endocrine symptoms including hot flashes and night sweats.   Meds:   Current Outpatient Medications on File Prior to Visit  Medication Sig Dispense Refill   ferrous sulfate 325 (65 FE) MG tablet Take 325 mg by mouth daily.     ibuprofen (ADVIL) 800 MG tablet Take 1 tablet (800 mg total) by mouth every 8 (eight) hours. 30 tablet 0   Prenatal Vit-Fe Fumarate-FA (MULTIVITAMIN-PRENATAL) 27-0.8 MG TABS tablet Take 1 tablet by mouth daily at 12 noon.     famotidine (PEPCID) 20 MG tablet Take 1 tablet (20 mg total) by mouth 2 (two) times daily. (Patient not taking: Reported on 05/02/2021) 60 tablet 1   ondansetron (ZOFRAN ODT) 4 MG disintegrating tablet Take 1 tablet (4 mg total) by mouth every 6 (six) hours as needed for nausea. (Patient not taking: Reported on 05/02/2021) 20 tablet 0   oxyCODONE-acetaminophen (PERCOCET/ROXICET) 5-325 MG tablet Take 1-2 tablets by mouth every 4 (four) hours as needed for moderate pain. (Patient not taking: Reported on 05/02/2021) 20 tablet 0   No current facility-administered medications on file prior to visit.      Objective:     Vitals:   05/02/21 0946  BP: 112/73  Pulse: 75   Filed Weights   05/02/21 0946  Weight: 117 lb 14.4 oz (53.5 kg)                Assessment:    [id number] Patient Active  Problem List   Diagnosis Date Noted   COVID-19 affecting pregnancy, antepartum 11/26/2020   Vaginal bleeding during pregnancy, antepartum 11/24/2020   Twin pregnancy, twins dichorionic and diamniotic 11/24/2020   Nausea and vomiting of pregnancy, antepartum 11/15/2020   Indication for care or intervention related to labor and delivery 11/14/2020   Dichorionic diamniotic twin pregnancy in second trimester 08/29/2020   Grand multipara 08/29/2020   Gastroesophageal reflux in pregnancy 08/29/2020   Abdominal pain 01/15/2018   Choledocholithiasis    Jaundice    Elevated liver enzymes    Encounter for induction of labor 08/09/2017   Late prenatal care 03/15/2017   Nausea and vomiting during pregnancy prior to [redacted] weeks gestation 03/15/2017   History of preterm delivery, currently pregnant in second trimester 03/15/2017   Irregular uterine contractions 07/08/2015     1. Initiation of OCP (BCP)        Plan:            1.  Birth Control I discussed multiple birth control options and methods with the patient.  The risks and benefits of each were reviewed. IUD Literature on IUD made available.  Risks and benefits discussed.  She is considering IUD as an option for birth/cycle control. OCPs The risks /benefits of OCPs have been explained to the patient in detail.  Product literature has been given to her where appropriate.  I have instructed her in the use of OCPs.  I have explained to the patient that OCPs are not as effective for birth control during the first month of use, and that another form of contraception should be used during this time.  Both first-day start and Sunday start have been explained.  The risks and benefits of each was discussed.  She has been made aware of  the fact that in rare circumstances, other medications may affect the efficacy of OCPs.  I have answered all of her questions, and I believe that she has an understanding of the effectiveness and use of OCPs. She has  elected to begin OCPs.  She would like to try [facility]. Orders No orders of the defined types were placed in this encounter.    Meds ordered this encounter  Medications   Levonorgestrel-Ethinyl Estradiol (AMETHIA) 0.15-0.03 &0.01 MG tablet    Sig: Take 1 tablet by mouth at bedtime.    Dispense:  84 tablet    Refill:  1       F/U  Return in about 4 months (around 09/02/2021). I spent 22 minutes involved in the care of this patient preparing to see the patient by obtaining and reviewing her medical history (including labs, imaging tests and prior procedures), documenting clinical information in the electronic health record (EHR), counseling and coordinating care plans, writing and sending prescriptions, ordering tests or procedures and directly communicating with the patient by discussing pertinent items from her history and physical exam as well as detailing my assessment and plan as noted above so that she has an informed understanding.  All of her questions were answered.  Elonda Husky, M.D. 05/02/2021 10:27 AM

## 2021-05-14 DIAGNOSIS — Z419 Encounter for procedure for purposes other than remedying health state, unspecified: Secondary | ICD-10-CM | POA: Diagnosis not present

## 2021-05-31 ENCOUNTER — Telehealth: Payer: Self-pay

## 2021-05-31 NOTE — Telephone Encounter (Signed)
Transition Care Management Follow-up Telephone Call Date of discharge and from where: 05/30/2021-[facility] How have you been since you were released from the hospital? Patent stated she feels fine  Any questions or concerns? No  Items Reviewed: Did the pt receive and understand the discharge instructions provided?  [facility] Medications obtained and verified?  [facility] Other? No  Any new allergies since your discharge?  [facility]BS Dietary orders reviewed? No Do you have support at home? Yes   Home Care and Equipment/Supplies: Were home health services ordered? not applicable If so, what is the name of the agency? N/A  Has the agency set up a time to come to the patient's home? not applicable Were any new equipment or medical supplies ordered?  No What is the name of the medical supply agency? N/A Were you able to get the supplies/equipment? not applicable Do you have any questions related to the use of the equipment or supplies? No  Functional Questionnaire: (I = Independent and D = Dependent) ADLs: I  Bathing/Dressing- I  Meal Prep- I  Eating- I  Maintaining continence- I  Transferring/Ambulation- I  Managing Meds- I  Follow up appointments reviewed:  PCP Hospital f/u appt confirmed? No    Specialist Hospital f/u appt confirmed? No   Are transportation arrangements needed? No  If their condition worsens, is the pt aware to call PCP or go to the Emergency Dept.? Yes Was the patient provided with contact information for the PCP's office or ED? Yes Was to pt encouraged to call back with questions or concerns? Yes

## 2021-06-14 DIAGNOSIS — Z419 Encounter for procedure for purposes other than remedying health state, unspecified: Secondary | ICD-10-CM | POA: Diagnosis not present

## 2021-07-02 ENCOUNTER — Encounter (HOSPITAL_COMMUNITY): Payer: Self-pay | Admitting: *Deleted

## 2021-07-02 ENCOUNTER — Inpatient Hospital Stay (HOSPITAL_COMMUNITY)
Admission: EM | Admit: 2021-07-02 | Discharge: 2021-07-04 | DRG: 419 | Disposition: A | Discharge status: Home / Self Care | Payer: Medicaid Other | Attending: General Surgery | Admitting: General Surgery

## 2021-07-02 ENCOUNTER — Other Ambulatory Visit: Payer: Self-pay

## 2021-07-02 ENCOUNTER — Emergency Department (HOSPITAL_COMMUNITY): Payer: Medicaid Other

## 2021-07-02 DIAGNOSIS — R109 Unspecified abdominal pain: Secondary | ICD-10-CM

## 2021-07-02 DIAGNOSIS — F17200 Nicotine dependence, unspecified, uncomplicated: Secondary | ICD-10-CM | POA: Diagnosis not present

## 2021-07-02 DIAGNOSIS — K8000 Calculus of gallbladder with acute cholecystitis without obstruction: Secondary | ICD-10-CM | POA: Diagnosis not present

## 2021-07-02 DIAGNOSIS — K5900 Constipation, unspecified: Secondary | ICD-10-CM | POA: Diagnosis present

## 2021-07-02 DIAGNOSIS — R519 Headache, unspecified: Secondary | ICD-10-CM | POA: Diagnosis present

## 2021-07-02 DIAGNOSIS — K810 Acute cholecystitis: Secondary | ICD-10-CM | POA: Diagnosis not present

## 2021-07-02 DIAGNOSIS — K828 Other specified diseases of gallbladder: Secondary | ICD-10-CM | POA: Diagnosis not present

## 2021-07-02 DIAGNOSIS — J45909 Unspecified asthma, uncomplicated: Secondary | ICD-10-CM | POA: Diagnosis present

## 2021-07-02 DIAGNOSIS — Z8249 Family history of ischemic heart disease and other diseases of the circulatory system: Secondary | ICD-10-CM | POA: Diagnosis not present

## 2021-07-02 DIAGNOSIS — F419 Anxiety disorder, unspecified: Secondary | ICD-10-CM | POA: Diagnosis present

## 2021-07-02 DIAGNOSIS — R1011 Right upper quadrant pain: Secondary | ICD-10-CM | POA: Diagnosis not present

## 2021-07-02 DIAGNOSIS — K8020 Calculus of gallbladder without cholecystitis without obstruction: Secondary | ICD-10-CM | POA: Diagnosis not present

## 2021-07-02 DIAGNOSIS — F32A Depression, unspecified: Secondary | ICD-10-CM | POA: Diagnosis not present

## 2021-07-02 DIAGNOSIS — Z20822 Contact with and (suspected) exposure to covid-19: Secondary | ICD-10-CM | POA: Diagnosis present

## 2021-07-02 HISTORY — DX: Calculus of gallbladder with acute cholecystitis without obstruction: K80.00

## 2021-07-02 LAB — COMPREHENSIVE METABOLIC PANEL
ALT: 65 U/L — ABNORMAL HIGH (ref 0–44)
AST: 70 U/L — ABNORMAL HIGH (ref 15–41)
Albumin: 4.1 g/dL (ref 3.5–5.0)
Alkaline Phosphatase: 47 U/L (ref 38–126)
Anion gap: 8 (ref 5–15)
BUN: 10 mg/dL (ref 6–20)
CO2: 25 mmol/L (ref 22–32)
Calcium: 9 mg/dL (ref 8.9–10.3)
Chloride: 103 mmol/L (ref 98–111)
Creatinine, Ser: 0.81 mg/dL (ref 0.44–1.00)
GFR, Estimated: >60 mL/min (ref >60)
Glucose, Bld: 106 mg/dL — ABNORMAL HIGH (ref 70–99)
Potassium: 3.7 mmol/L (ref 3.5–5.1)
Sodium: 136 mmol/L (ref 135–145)
Total Bilirubin: 0.5 mg/dL (ref 0.3–1.2)
Total Protein: 7.7 g/dL (ref 6.5–8.1)

## 2021-07-02 LAB — CBC
HCT: 37.1 % (ref 36.0–46.0)
Hemoglobin: 11.7 g/dL — ABNORMAL LOW (ref 12.0–15.0)
MCH: 27.7 pg (ref 26.0–34.0)
MCHC: 31.5 g/dL (ref 30.0–36.0)
MCV: 87.9 fL (ref 80.0–100.0)
Platelets: 331 10*3/uL (ref 150–400)
RBC: 4.22 MIL/uL (ref 3.87–5.11)
RDW: 14.3 % (ref 11.5–15.5)
WBC: 11.8 10*3/uL — ABNORMAL HIGH (ref 4.0–10.5)
nRBC: 0 % (ref 0.0–0.2)

## 2021-07-02 LAB — RESP PANEL BY RT-PCR (FLU A&B, COVID) ARPGX2
Influenza A by PCR: NEGATIVE
Influenza B by PCR: NEGATIVE
SARS Coronavirus 2 by RT PCR: NEGATIVE

## 2021-07-02 LAB — LIPASE, BLOOD: Lipase: 36 U/L (ref 11–51)

## 2021-07-02 MED ORDER — POLYETHYLENE GLYCOL 3350 17 G PO PACK: 17.0000 g | PACK | Freq: Every day | ORAL | Status: DC | PRN

## 2021-07-02 MED ORDER — HYDROMORPHONE HCL 1 MG/ML IJ SOLN
0.5000 mg | Freq: Once | INTRAMUSCULAR | Status: AC
  Administered 2021-07-02: 0.5 mg via INTRAVENOUS
  Filled 2021-07-02: qty 1

## 2021-07-02 MED ORDER — PIPERACILLIN-TAZOBACTAM 3.375 G IVPB
3.3750 g | Freq: Three times a day (TID) | INTRAVENOUS | Status: DC
  Administered 2021-07-03 (×2): 3.375 g via INTRAVENOUS
  Filled 2021-07-02 (×2): qty 50

## 2021-07-02 MED ORDER — MORPHINE SULFATE (PF) 2 MG/ML IV SOLN
2.0000 mg | INTRAVENOUS | Status: DC | PRN
  Administered 2021-07-02 – 2021-07-03 (×2): 2 mg via INTRAVENOUS
  Filled 2021-07-02 (×2): qty 1

## 2021-07-02 MED ORDER — KCL IN DEXTROSE-NACL 20-5-0.9 MEQ/L-%-% IV SOLN: INTRAVENOUS | Status: DC

## 2021-07-02 MED ORDER — SODIUM CHLORIDE 0.9 % IV BOLUS
1000.0000 mL | Freq: Once | INTRAVENOUS | Status: AC
  Administered 2021-07-02: 1000 mL via INTRAVENOUS

## 2021-07-02 MED ORDER — PIPERACILLIN-TAZOBACTAM 3.375 G IVPB 30 MIN
3.3750 g | Freq: Once | INTRAVENOUS | Status: AC
  Administered 2021-07-02: 3.375 g via INTRAVENOUS
  Filled 2021-07-02: qty 50

## 2021-07-02 MED ORDER — ONDANSETRON HCL 4 MG PO TABS: 4.0000 mg | ORAL_TABLET | Freq: Four times a day (QID) | ORAL | Status: DC | PRN

## 2021-07-02 MED ORDER — ACETAMINOPHEN 325 MG PO TABS
650.0000 mg | ORAL_TABLET | Freq: Four times a day (QID) | ORAL | Status: DC | PRN
  Filled 2021-07-02: qty 2

## 2021-07-02 MED ORDER — ONDANSETRON HCL 4 MG/2ML IJ SOLN
4.0000 mg | Freq: Once | INTRAMUSCULAR | Status: AC
Start: 2021-07-02 — End: 2021-07-02
  Administered 2021-07-02: 4 mg via INTRAVENOUS
  Filled 2021-07-02: qty 2

## 2021-07-02 MED ORDER — LEVONORGEST-ETH ESTRAD 91-DAY 0.15-0.03 &0.01 MG PO TABS: 1.0000 | ORAL_TABLET | Freq: Every day | ORAL | Status: DC

## 2021-07-02 MED ORDER — ONDANSETRON HCL 4 MG/2ML IJ SOLN
4.0000 mg | Freq: Four times a day (QID) | INTRAMUSCULAR | Status: DC | PRN
  Administered 2021-07-03 – 2021-07-04 (×2): 4 mg via INTRAVENOUS
  Filled 2021-07-02 (×3): qty 2

## 2021-07-02 MED ORDER — ACETAMINOPHEN 650 MG RE SUPP: 650.0000 mg | Freq: Four times a day (QID) | RECTAL | Status: DC | PRN

## 2021-07-02 NOTE — ED Provider Notes (Signed)
[facility] EMERGENCY DEPARTMENT Provider Note   CSN: [id number] Arrival date & time: 07/02/21  1322     History Chief Complaint  Patient presents with   Abdominal Pain    Lacey Bentley is a 30 y.o. female.  HPI  Patient with no significant medical history presents to the emergency department with chief complaint of right upper quadrant pain.  Patient states she has been having pain for about 4 years but over the last week's time the pain has become more significant.  She states the pain is a sharp-like sensation which she feels in her right upper quadrant, will radiate around her rib cage, its worsened after she eats or drinks, she has associated constipation she denies melena, hematochezia, states her last bowel movement was 4 days ago, she states she still passing flatus.  She she has had an ERCP performed 3 years ago where they removed a gallbladder stone from one of the biliary trees.  At that time the gallbladder was not removed.  Her only other significant abdominal history was a C-section which performed in February.  She denies history of alcohol use, NSAID use, history of PUD, bowel obstructions, diverticulitis or appendicitis.  Patient does not endorse chest pain, shortness of breath, worsening pedal edema. Past Medical History:  Diagnosis Date   Anxiety    Asthma    Childhood   Depression    Headache    History of preterm delivery    DELIVERED 5 WKS EARLY    Patient Active Problem List   Diagnosis Date Noted   Acute calculous cholecystitis 07/02/2021   COVID-19 affecting pregnancy, antepartum 11/26/2020   Vaginal bleeding during pregnancy, antepartum 11/24/2020   Twin pregnancy, twins dichorionic and diamniotic 11/24/2020   Nausea and vomiting of pregnancy, antepartum 11/15/2020   Indication for care or intervention related to labor and delivery 11/14/2020   Dichorionic diamniotic twin pregnancy in second trimester 08/29/2020   Grand multipara 08/29/2020    Gastroesophageal reflux in pregnancy 08/29/2020   Abdominal pain 01/15/2018   Choledocholithiasis    Jaundice    Elevated liver enzymes    Encounter for induction of labor 08/09/2017   Late prenatal care 03/15/2017   Nausea and vomiting during pregnancy prior to [redacted] weeks gestation 03/15/2017   History of preterm delivery, currently pregnant in second trimester 03/15/2017   Irregular uterine contractions 07/08/2015    Past Surgical History:  Procedure Laterality Date   CESAREAN SECTION MULTI-GESTATIONAL N/A 11/24/2020   Procedure: CESAREAN SECTION MULTI-GESTATIONAL;  Surgeon: Linzie Collin, MD;  Location: [facility] ORS;  Service: Obstetrics;  Laterality: N/A;   ENDOSCOPIC RETROGRADE CHOLANGIOPANCREATOGRAPHY (ERCP) WITH PROPOFOL N/A 01/15/2018   Procedure: ENDOSCOPIC RETROGRADE CHOLANGIOPANCREATOGRAPHY (ERCP) WITH PROPOFOL;  Surgeon: Midge Minium, MD;  Location: [facility] ENDOSCOPY;  Service: Endoscopy;  Laterality: N/A;   NO PAST SURGERIES       OB History     Gravida  20   Para  13   Term  10   Preterm  3   AB  3   Living  8      SAB  1   IAB  2   Ectopic  0   Multiple  1   Live Births  8           Family History  Problem Relation Age of Onset   Hypertension Father     Social History   Tobacco Use   Smoking status: Every Day    Packs/day: 0.50  Years: 2.00    Pack years: 1.00    Types: Cigarettes   Smokeless tobacco: Never  Vaping Use   Vaping Use: Never used  Substance Use Topics   Alcohol use: Not Currently   Drug use: Never    Home Medications Prior to Admission medications   Medication Sig Start Date End Date Taking? Authorizing Provider  famotidine (PEPCID) 20 MG tablet Take 1 tablet (20 mg total) by mouth 2 (two) times daily. Patient not taking: Reported on 05/02/2021 11/15/20 11/15/21  Hildred Laser, MD  ferrous sulfate 325 (65 FE) MG tablet Take 325 mg by mouth daily.    [provider]  ibuprofen (ADVIL) 800 MG tablet Take 1 tablet  (800 mg total) by mouth every 8 (eight) hours. 11/26/20   Linzie Collin, MD  Levonorgestrel-Ethinyl Estradiol (AMETHIA) 0.15-0.03 &0.01 MG tablet Take 1 tablet by mouth at bedtime. 05/02/21   Linzie Collin, MD  ondansetron (ZOFRAN ODT) 4 MG disintegrating tablet Take 1 tablet (4 mg total) by mouth every 6 (six) hours as needed for nausea. Patient not taking: Reported on 05/02/2021 11/15/20   Hildred Laser, MD  oxyCODONE-acetaminophen (PERCOCET/ROXICET) 5-325 MG tablet Take 1-2 tablets by mouth every 4 (four) hours as needed for moderate pain. Patient not taking: Reported on 05/02/2021 11/26/20   Linzie Collin, MD  Prenatal Vit-Fe Fumarate-FA (MULTIVITAMIN-PRENATAL) 27-0.8 MG TABS tablet Take 1 tablet by mouth daily at 12 noon.    [provider]    Allergies    Sunscreens and Sunscreens  Review of Systems   Review of Systems  Constitutional:  Positive for chills and fever.  HENT:  Negative for congestion.   Respiratory:  Negative for shortness of breath.   Cardiovascular:  Negative for chest pain.  Gastrointestinal:  Positive for abdominal pain and constipation. Negative for nausea and vomiting.  Genitourinary:  Negative for enuresis.  Musculoskeletal:  Negative for back pain.  Skin:  Negative for rash.  Neurological:  Negative for headaches.  Hematological:  Does not bruise/bleed easily.   Physical Exam Updated Vital Signs BP 112/77 (BP Location: Right Arm)   Pulse 77   Temp 98.9 F (37.2 C)   Resp 18   Ht 5\' 2"  (1.575 m)   Wt 49.8 kg   SpO2 100%   BMI 20.08 kg/m   Physical Exam Vitals and nursing note reviewed.  Constitutional:      General: She is not in acute distress.    Appearance: She is not ill-appearing.  HENT:     Head: Normocephalic and atraumatic.     Nose: No congestion.  Eyes:     Conjunctiva/sclera: Conjunctivae normal.  Cardiovascular:     Rate and Rhythm: Normal rate and regular rhythm.     Pulses: Normal pulses.     Heart sounds:  No murmur heard.   No friction rub. No gallop.  Pulmonary:     Effort: No respiratory distress.     Breath sounds: No wheezing, rhonchi or rales.  Abdominal:     Palpations: Abdomen is soft.     Tenderness: There is abdominal tenderness. There is no right CVA tenderness or left CVA tenderness.     Comments: Abdomen nondistended, normal bowel sounds, dull to percussion, she had tenderness along her right upper quadrant, positive Murphy sign, no guarding, rebound tenderness, peritoneal sign.  No CVA tenderness.  Musculoskeletal:     Right lower leg: No edema.     Left lower leg: No edema.  Skin:  General: Skin is warm and dry.  Neurological:     Mental Status: She is alert.  Psychiatric:        Mood and Affect: Mood normal.    ED Results / Procedures / Treatments   Labs (all labs ordered are listed, but only abnormal results are displayed) Labs Reviewed  COMPREHENSIVE METABOLIC PANEL - Abnormal; Notable for the following components:      Result Value   Glucose, Bld 106 (*)    AST 70 (*)    ALT 65 (*)    All other components within normal limits  CBC - Abnormal; Notable for the following components:   WBC 11.8 (*)    Hemoglobin 11.7 (*)    All other components within normal limits  RESP PANEL BY RT-PCR (FLU A&B, COVID) [id number]  CULTURE, BLOOD (ROUTINE X 2)  CULTURE, BLOOD (ROUTINE X 2)  LIPASE, BLOOD  URINALYSIS, ROUTINE W REFLEX MICROSCOPIC  POC URINE PREG, ED    EKG None  Radiology US Abdomen Limited  Result Date: 07/02/2021 CLINICAL DATA:  Right upper quadrant pain one month following Caesarean section EXAM: ULTRASOUND ABDOMEN LIMITED RIGHT UPPER QUADRANT COMPARISON:  None. FINDINGS: Gallbladder: Well distended with multiple gallstones. Wall is mildly thickened at 4 mm. Positive sonographic Murphy's sign is elicited. Common bile duct: Diameter: 9 mm. Sludge is noted within the gallbladder. No definitive stones are seen. Liver: No focal lesion identified. Within normal  limits in parenchymal echogenicity. Portal vein is patent on color Doppler imaging with normal direction of blood flow towards the liver. Other: None. IMPRESSION: Findings consistent with acute calculus cholecystitis. Electronically Signed   By: Alcide Clever M.D.   On: 07/02/2021 17:41    Procedures Procedures   Medications Ordered in ED Medications  piperacillin-tazobactam (ZOSYN) IVPB 3.375 g (has no administration in time range)  sodium chloride 0.9 % bolus 1,000 mL (1,000 mLs Intravenous New Bag/Given 07/02/21 1825)  ondansetron (ZOFRAN) injection 4 mg (4 mg Intravenous Given 07/02/21 1825)  HYDROmorphone (DILAUDID) injection 0.5 mg (0.5 mg Intravenous Given 07/02/21 1823)    ED Course  I have reviewed the triage vital signs and the nursing notes.  Pertinent labs & imaging results that were available during my care of the patient were reviewed by me and considered in my medical decision making (see chart for details).    MDM Rules/Calculators/A&P                          Initial impression-patient presents with right upper quadrant tenderness.  She is alert, does not appear in acute stress, vital signs reassuring.  Concern for possible cholecystitis will obtain basic lab work-up, order limited ultrasound and reassess.  Work-up-CBC shows leukocytosis of 11.8, normocytic anemia with a hemoglobin 0.7, CMP shows hyperglycemia 106, elevated liver enzymes AST 70 ALT 65, lipase is 36.  Limited ultrasound reveals findings consistent with acute calculus cholecystitis.  Reassessment-imaging confirms cholecystitis, will start patient on fluids, pain medications, consult with general surgery for further recommendations.  Patient was updated on recommendation from general surgery she is in agreement with this plan.  Will admit patient consult with hospitalist for admission.  Consult-spoke with Dr. Henreitta Leber who recommends admission, make her n.p.o. by midnight tonight, she will go down to surgery  tomorrow, recommends start patient on Zosyn.  Spoke with Dr Jerolyn Center who will admit the patient.  Rule out-low suspicion for cholangitis or choledocholithiasis as imaging does not reveal dilation of the common bile  duct, there is also no increase in T bili or lipase.  I have low suspicion for bowel obstruction as abdomen is nondistended dull to percussion, patient still passing gas.  Low suspicion for complicated diverticulitis as presentation is atypical for etiology she has right upper quadrant tenderness, denies melena or hematochezia him.  Low suspicion for URI or pneumonia as patient denies URI-like symptoms, lung sounds are clear bilaterally.  low suspicion for UTI, [place], kidney stone patient has no urinary symptoms, no CVA tenderness.  Plan-admit due to acute cholecystitis.  Final Clinical Impression(s) / ED Diagnoses Final diagnoses:  Acute cholecystitis    Rx / DC Orders ED Discharge Orders     None        Barnie Del 07/02/21 Murriel Hopper, MD 07/18/21 [phone]

## 2021-07-02 NOTE — ED Triage Notes (Signed)
Abdominal pain intermittent for a month

## 2021-07-02 NOTE — Progress Notes (Signed)
Pharmacy Antibiotic Note  Lacey Bentley is a 30 y.o. female admitted on 07/02/2021 with R-sided abdominal pain, acute calculous cholecystitis.  Pharmacy has been consulted for Zosyn dosing for intraabdominal infection.  WBC 11.8, afebrile; Scr 0.81, CrCl ~81 ml/min  Plan: Zosyn 3.375 gm IV Q 8 hrs (extended infusion) Monitor WBC, temp, clinical course, renal function  Height: 5\' 2"  (157.5 cm) Weight: 49.8 kg (109 lb 12.6 oz) IBW/kg (Calculated) : 50.1  Temp (24hrs), Avg:98.9 F (37.2 C), Min:98.9 F (37.2 C), Max:98.9 F (37.2 C)  Recent Labs  Lab 07/02/21 1416  WBC 11.8*  CREATININE 0.81    Estimated Creatinine Clearance: 80.6 mL/min (by C-G formula based on SCr of 0.81 mg/dL).    Allergies  Allergen Reactions   Sunscreens Rash   Sunscreens Rash    Antimicrobials this admission: 9/19 Zosyn >>  Microbiology results: 9/19 COVID, flu A, flu B: negative 9/19 Bld cx X 2: pending  Thank you for allowing pharmacy to be a part of this patient's care.  10/19, PharmD, BCPS, [facility] Clinical Pharmacist 07/02/2021 9:18 PM

## 2021-07-02 NOTE — H&P (Addendum)
History and Physical    Sumiya C. Clarisa Fling SWH:[id number] DOB: Jan 15, 1991 DOA: 07/02/2021  PCP: Patient, No Pcp Per (Inactive)   Patient coming from: Home  I have personally briefly reviewed patient's old medical records in [facility]  Chief Complaint: Right sided abdominal pain  HPI: Lacey Bentley is a 30 y.o. female with medical history significant for anxiety, depression.  Patient presented to ED with complaints of intermittent abdominal pain of 1 month duration.  She reports nausea, with very occasional vomiting.   2019 Patient had ERCP for jaundice, elevated liver enzymes-results showed choledocholithiasis was found. Complete removal was accomplished by biliary sphincterotomy and balloon extraction. A biliary sphincterotomy was performed. The biliary tree was swept.  ED Course: Stable vitals.  WBC 11.8.  AST 70, ALT 65.  ALP normal 47.  Total bilirubin normal 0.5. RUQ Us-findings consistent with acute calculus cholecystitis.  EDP talked to Dr. Henreitta Leber, plan for surgery tomorrow, n.p.o. midnight.  IV antibiotics.  Review of Systems: As per HPI all other systems reviewed and negative.  Past Medical History:  Diagnosis Date   Anxiety    Asthma    Childhood   Depression    Headache    History of preterm delivery    DELIVERED 5 WKS EARLY    Past Surgical History:  Procedure Laterality Date   CESAREAN SECTION MULTI-GESTATIONAL N/A 11/24/2020   Procedure: CESAREAN SECTION MULTI-GESTATIONAL;  Surgeon: Linzie Collin, MD;  Location: [facility] ORS;  Service: Obstetrics;  Laterality: N/A;   ENDOSCOPIC RETROGRADE CHOLANGIOPANCREATOGRAPHY (ERCP) WITH PROPOFOL N/A 01/15/2018   Procedure: ENDOSCOPIC RETROGRADE CHOLANGIOPANCREATOGRAPHY (ERCP) WITH PROPOFOL;  Surgeon: Midge Minium, MD;  Location: [facility] ENDOSCOPY;  Service: Endoscopy;  Laterality: N/A;   NO PAST SURGERIES       reports that she has been smoking. She has a 1.00 pack-year smoking history. She has never used smokeless  tobacco. She reports that she does not currently use alcohol. She reports that she does not use drugs.  Allergies  Allergen Reactions   Sunscreens Rash   Sunscreens Rash    Family History  Problem Relation Age of Onset   Hypertension Father    Prior to Admission medications   Medication Sig Start Date End Date Taking? Authorizing Provider  ferrous sulfate 325 (65 FE) MG tablet Take 325 mg by mouth daily.   Yes [provider]  ibuprofen (ADVIL) 800 MG tablet Take 1 tablet (800 mg total) by mouth every 8 (eight) hours. Patient taking differently: Take 800 mg by mouth See admin instructions. Every 4 to 6 hours as needed for pain 11/26/20  Yes Linzie Collin, MD  Levonorgestrel-Ethinyl Estradiol (AMETHIA) 0.15-0.03 &0.01 MG tablet Take 1 tablet by mouth at bedtime. 05/02/21  Yes Linzie Collin, MD  OVER THE COUNTER MEDICATION Take 1 tablet by mouth daily. Anxiety and stress   Yes [provider]  Prenatal Vit-Fe Fumarate-FA (MULTIVITAMIN-PRENATAL) 27-0.8 MG TABS tablet Take 1 tablet by mouth daily at 12 noon.   Yes [provider]  famotidine (PEPCID) 20 MG tablet Take 1 tablet (20 mg total) by mouth 2 (two) times daily. Patient not taking: No sig reported 11/15/20 11/15/21  Hildred Laser, MD  ondansetron (ZOFRAN ODT) 4 MG disintegrating tablet Take 1 tablet (4 mg total) by mouth every 6 (six) hours as needed for nausea. Patient not taking: No sig reported 11/15/20   Hildred Laser, MD  oxyCODONE-acetaminophen (PERCOCET/ROXICET) 5-325 MG tablet Take 1-2 tablets by mouth every 4 (four) hours as needed  for moderate pain. Patient not taking: No sig reported 11/26/20   Linzie Collin, MD    Physical Exam: Vitals:   07/02/21 1402 07/02/21 1405 07/02/21 1906  BP:  112/77 106/76  Pulse:  77 65  Resp:  18 17  Temp:  98.9 F (37.2 C)   SpO2:  100% 100%  Weight: 49.8 kg    Height: 5\' 2"  (1.575 m)      Constitutional: NAD, calm, comfortable Vitals:   07/02/21  1402 07/02/21 1405 07/02/21 1906  BP:  112/77 106/76  Pulse:  77 65  Resp:  18 17  Temp:  98.9 F (37.2 C)   SpO2:  100% 100%  Weight: 49.8 kg    Height: 5\' 2"  (1.575 m)     Eyes: ds and conjunctivae normal ENMT: Mucous membranes are moist. Neck: normal, supple, no masses, no thyromegaly Respiratory: clear to auscultation bilaterally, no wheezing, no crackles. Normal respiratory effort. No accessory muscle use.  Cardiovascular: Regular rate and rhythm, no murmurs / rubs / gallops. No extremity edema.   Abdomen: no tenderness, no masses palpated. No hepatosplenomegaly. Bowel sounds positive.  Musculoskeletal: no clubbing / cyanosis. No joint deformity upper and lower extremities. Good ROM, no contractures. Normal muscle tone.  Skin: no rashes, lesions, ulcers. No induration Neurologic: No apparent cranial nerve abnormality Psychiatric: Normal judgment and insight. Alert and oriented x 3. Normal mood.   Labs on Admission: I have personally reviewed following labs and imaging studies  CBC: Recent Labs  Lab 07/02/21 1416  WBC 11.8*  HGB 11.7*  HCT 37.1  MCV 87.9  PLT 331   Basic Metabolic Panel: Recent Labs  Lab 07/02/21 1416  NA 136  K 3.7  CL 103  CO2 25  GLUCOSE 106*  BUN 10  CREATININE 0.81  CALCIUM 9.0   Liver Function Tests: Recent Labs  Lab 07/02/21 1416  AST 70*  ALT 65*  ALKPHOS 47  BILITOT 0.5  PROT 7.7  ALBUMIN 4.1   Recent Labs  Lab 07/02/21 1416  LIPASE 36    Radiological Exams on Admission: 07/04/21 Abdomen Limited  Result Date: 07/02/2021 CLINICAL DATA:  Right upper quadrant pain one month following Caesarean section EXAM: ULTRASOUND ABDOMEN LIMITED RIGHT UPPER QUADRANT COMPARISON:  None. FINDINGS: Gallbladder: Well distended with multiple gallstones. Wall is mildly thickened at 4 mm. Positive sonographic Murphy's sign is elicited. Common bile duct: Diameter: 9 mm. Sludge is noted within the gallbladder. No definitive stones are seen. Liver: No  focal lesion identified. Within normal limits in parenchymal echogenicity. Portal vein is patent on color Doppler imaging with normal direction of blood flow towards the liver. Other: None. IMPRESSION: Findings consistent with acute calculus cholecystitis. Electronically Signed   By: [place] M.D.   On: 07/02/2021 17:41    EKG: None.   Assessment/Plan Active Problems:   Acute calculous cholecystitis   Acute calculus cholecystitis, rules out for sepsis.  WBC 11.8.  Right upper quadrant ultrasound shows gallbladder distended with multiple gallstones, positive Murphy signs, and findings consistent with acute calculus cholecystitis.  CMP shows mild transaminitis with AST of 70, ALT of 65, normal total bilirubin and ALP. -Dr. Alcide Clever to see in the morning - NPO midnight - IV Zofran, IV morphine as needed - 1 L bolus given, continue D5 N/s + 20 kcl 100cc/hr x 20 hrs - CMP, CBC a.m - IV zosyn  DVT prophylaxis: SCDs Code Status: Full code Family Communication: None at bedside Disposition Plan: ~ 2 days Consults  called: gen surg Admission status: Inpt, med surg I certify that at the point of admission it is my clinical judgment that the patient will require inpatient hospital care spanning beyond 2 midnights from the point of admission due to high intensity of service, high risk for further deterioration and high frequency of surveillance required.    Onnie Boer MD [facility]  07/02/2021, 9:14 PM

## 2021-07-03 ENCOUNTER — Inpatient Hospital Stay (HOSPITAL_COMMUNITY): Payer: Medicaid Other | Admitting: Certified Registered"

## 2021-07-03 ENCOUNTER — Inpatient Hospital Stay (HOSPITAL_COMMUNITY): Payer: Medicaid Other

## 2021-07-03 ENCOUNTER — Encounter (HOSPITAL_COMMUNITY)
Admission: EM | Disposition: A | Discharge status: Home / Self Care | Payer: Self-pay | Source: Home / Self Care | Attending: General Surgery

## 2021-07-03 ENCOUNTER — Encounter (HOSPITAL_COMMUNITY): Payer: Self-pay | Admitting: Internal Medicine

## 2021-07-03 DIAGNOSIS — K8000 Calculus of gallbladder with acute cholecystitis without obstruction: Secondary | ICD-10-CM | POA: Diagnosis not present

## 2021-07-03 HISTORY — PX: CHOLECYSTECTOMY: SHX55

## 2021-07-03 LAB — CBC
HCT: 30.7 % — ABNORMAL LOW (ref 36.0–46.0)
Hemoglobin: 9.6 g/dL — ABNORMAL LOW (ref 12.0–15.0)
MCH: 27.7 pg (ref 26.0–34.0)
MCHC: 31.3 g/dL (ref 30.0–36.0)
MCV: 88.7 fL (ref 80.0–100.0)
Platelets: 246 10*3/uL (ref 150–400)
RBC: 3.46 MIL/uL — ABNORMAL LOW (ref 3.87–5.11)
RDW: 14.2 % (ref 11.5–15.5)
WBC: 5 10*3/uL (ref 4.0–10.5)
nRBC: 0 % (ref 0.0–0.2)

## 2021-07-03 LAB — COMPREHENSIVE METABOLIC PANEL
ALT: 216 U/L — ABNORMAL HIGH (ref 0–44)
AST: 192 U/L — ABNORMAL HIGH (ref 15–41)
Albumin: 3.4 g/dL — ABNORMAL LOW (ref 3.5–5.0)
Alkaline Phosphatase: 55 U/L (ref 38–126)
Anion gap: 3 — ABNORMAL LOW (ref 5–15)
BUN: 7 mg/dL (ref 6–20)
CO2: 26 mmol/L (ref 22–32)
Calcium: 8.7 mg/dL — ABNORMAL LOW (ref 8.9–10.3)
Chloride: 109 mmol/L (ref 98–111)
Creatinine, Ser: 0.62 mg/dL (ref 0.44–1.00)
GFR, Estimated: >60 mL/min (ref >60)
Glucose, Bld: 122 mg/dL — ABNORMAL HIGH (ref 70–99)
Potassium: 3.9 mmol/L (ref 3.5–5.1)
Sodium: 138 mmol/L (ref 135–145)
Total Bilirubin: 1.6 mg/dL — ABNORMAL HIGH (ref 0.3–1.2)
Total Protein: 6.1 g/dL — ABNORMAL LOW (ref 6.5–8.1)

## 2021-07-03 LAB — PREGNANCY, URINE: Preg Test, Ur: NEGATIVE

## 2021-07-03 LAB — SURGICAL PCR SCREEN
MRSA, PCR: NEGATIVE
Staphylococcus aureus: NEGATIVE

## 2021-07-03 LAB — HIV ANTIBODY (ROUTINE TESTING W REFLEX): HIV Screen 4th Generation wRfx: NONREACTIVE

## 2021-07-03 SURGERY — LAPAROSCOPIC CHOLECYSTECTOMY WITH INTRAOPERATIVE CHOLANGIOGRAM
Anesthesia: General | Site: Abdomen

## 2021-07-03 MED ORDER — HYDRALAZINE HCL 20 MG/ML IJ SOLN: 10.0000 mg | INTRAMUSCULAR | Status: DC | PRN

## 2021-07-03 MED ORDER — FENTANYL CITRATE (PF) 250 MCG/5ML IJ SOLN
INTRAMUSCULAR | Status: AC
  Filled 2021-07-03: qty 5

## 2021-07-03 MED ORDER — OXYCODONE HCL 5 MG PO TABS
5.0000 mg | ORAL_TABLET | ORAL | Status: DC | PRN
  Administered 2021-07-03 – 2021-07-04 (×3): 5 mg via ORAL
  Filled 2021-07-03 (×3): qty 1

## 2021-07-03 MED ORDER — ONDANSETRON HCL 4 MG/2ML IJ SOLN
INTRAMUSCULAR | Status: AC
  Filled 2021-07-03: qty 2

## 2021-07-03 MED ORDER — FENTANYL CITRATE PF 50 MCG/ML IJ SOSY
PREFILLED_SYRINGE | INTRAMUSCULAR | Status: AC
  Filled 2021-07-03: qty 1

## 2021-07-03 MED ORDER — SODIUM CHLORIDE 0.9 % IV SOLN
2.0000 g | INTRAVENOUS | Status: AC
  Administered 2021-07-03: 2 g via INTRAVENOUS
  Filled 2021-07-03: qty 2

## 2021-07-03 MED ORDER — MIDAZOLAM HCL 5 MG/5ML IJ SOLN
INTRAMUSCULAR | Status: DC | PRN
  Administered 2021-07-03: 2 mg via INTRAVENOUS

## 2021-07-03 MED ORDER — METOPROLOL TARTRATE 5 MG/5ML IV SOLN: 5.0000 mg | INTRAVENOUS | Status: DC | PRN

## 2021-07-03 MED ORDER — CHLORHEXIDINE GLUCONATE 0.12 % MT SOLN
15.0000 mL | Freq: Once | OROMUCOSAL | Status: AC
  Administered 2021-07-03: 15 mL via OROMUCOSAL

## 2021-07-03 MED ORDER — SENNOSIDES-DOCUSATE SODIUM 8.6-50 MG PO TABS: 1.0000 | ORAL_TABLET | Freq: Every evening | ORAL | Status: DC | PRN

## 2021-07-03 MED ORDER — KETOROLAC TROMETHAMINE 30 MG/ML IJ SOLN
INTRAMUSCULAR | Status: AC
  Filled 2021-07-03: qty 1

## 2021-07-03 MED ORDER — IPRATROPIUM-ALBUTEROL 0.5-2.5 (3) MG/3ML IN SOLN: 3.0000 mL | RESPIRATORY_TRACT | Status: DC | PRN

## 2021-07-03 MED ORDER — PROPOFOL 10 MG/ML IV BOLUS
INTRAVENOUS | Status: DC | PRN
  Administered 2021-07-03: 150 mg via INTRAVENOUS

## 2021-07-03 MED ORDER — LIDOCAINE HCL (PF) 2 % IJ SOLN
INTRAMUSCULAR | Status: AC
  Filled 2021-07-03: qty 5

## 2021-07-03 MED ORDER — SCOPOLAMINE 1 MG/3DAYS TD PT72
MEDICATED_PATCH | TRANSDERMAL | Status: AC
  Filled 2021-07-03: qty 1

## 2021-07-03 MED ORDER — ROCURONIUM BROMIDE 10 MG/ML (PF) SYRINGE
PREFILLED_SYRINGE | INTRAVENOUS | Status: AC
  Filled 2021-07-03: qty 10

## 2021-07-03 MED ORDER — OXYCODONE HCL 5 MG PO TABS: 5.0000 mg | ORAL_TABLET | ORAL | Status: DC | PRN

## 2021-07-03 MED ORDER — FENTANYL CITRATE PF 50 MCG/ML IJ SOSY
25.0000 ug | PREFILLED_SYRINGE | INTRAMUSCULAR | Status: DC | PRN
  Administered 2021-07-03 (×2): 50 ug via INTRAVENOUS
  Filled 2021-07-03: qty 1

## 2021-07-03 MED ORDER — MIDAZOLAM HCL 2 MG/2ML IJ SOLN
INTRAMUSCULAR | Status: AC
  Filled 2021-07-03: qty 2

## 2021-07-03 MED ORDER — OXYCODONE HCL 5 MG PO TABS: 5.0000 mg | ORAL_TABLET | ORAL | 0 refills | Status: DC | PRN

## 2021-07-03 MED ORDER — SODIUM CHLORIDE 0.9 % IV SOLN
INTRAVENOUS | Status: AC | PRN
  Administered 2021-07-03: 500 mL via INTRAMUSCULAR

## 2021-07-03 MED ORDER — ONDANSETRON HCL 4 MG/2ML IJ SOLN
4.0000 mg | Freq: Once | INTRAMUSCULAR | Status: AC | PRN
  Administered 2021-07-03: 4 mg via INTRAVENOUS

## 2021-07-03 MED ORDER — MORPHINE SULFATE (PF) 2 MG/ML IV SOLN
2.0000 mg | INTRAVENOUS | Status: DC | PRN
  Administered 2021-07-03 – 2021-07-04 (×3): 2 mg via INTRAVENOUS
  Filled 2021-07-03 (×3): qty 1

## 2021-07-03 MED ORDER — SCOPOLAMINE 1 MG/3DAYS TD PT72
1.0000 | MEDICATED_PATCH | Freq: Once | TRANSDERMAL | Status: DC
  Administered 2021-07-03: 1 via TRANSDERMAL

## 2021-07-03 MED ORDER — DEXTROSE-NACL 5-0.45 % IV SOLN: INTRAVENOUS | Status: DC

## 2021-07-03 MED ORDER — BUPIVACAINE HCL (PF) 0.5 % IJ SOLN
INTRAMUSCULAR | Status: AC
  Filled 2021-07-03: qty 30

## 2021-07-03 MED ORDER — SUGAMMADEX SODIUM 200 MG/2ML IV SOLN
INTRAVENOUS | Status: DC | PRN
Start: 2021-07-03 — End: 2021-07-03
  Administered 2021-07-03: 200 mg via INTRAVENOUS

## 2021-07-03 MED ORDER — ROCURONIUM BROMIDE 10 MG/ML (PF) SYRINGE
PREFILLED_SYRINGE | INTRAVENOUS | Status: DC | PRN
  Administered 2021-07-03: 40 mg via INTRAVENOUS

## 2021-07-03 MED ORDER — CHLORHEXIDINE GLUCONATE CLOTH 2 % EX PADS
6.0000 | MEDICATED_PAD | Freq: Once | CUTANEOUS | Status: AC
  Administered 2021-07-03: 6 via TOPICAL

## 2021-07-03 MED ORDER — PROPOFOL 10 MG/ML IV BOLUS
INTRAVENOUS | Status: AC
  Filled 2021-07-03: qty 20

## 2021-07-03 MED ORDER — ORAL CARE MOUTH RINSE: 15.0000 mL | Freq: Once | OROMUCOSAL | Status: AC

## 2021-07-03 MED ORDER — BUPIVACAINE HCL (PF) 0.5 % IJ SOLN
INTRAMUSCULAR | Status: DC | PRN
  Administered 2021-07-03: 20 mL

## 2021-07-03 MED ORDER — LACTATED RINGERS IV SOLN
INTRAVENOUS | Status: DC
  Administered 2021-07-03: 1000 mL via INTRAVENOUS

## 2021-07-03 MED ORDER — FENTANYL CITRATE (PF) 100 MCG/2ML IJ SOLN
INTRAMUSCULAR | Status: DC | PRN
  Administered 2021-07-03: 100 ug via INTRAVENOUS
  Administered 2021-07-03 (×3): 50 ug via INTRAVENOUS

## 2021-07-03 MED ORDER — DEXAMETHASONE SODIUM PHOSPHATE 10 MG/ML IJ SOLN
INTRAMUSCULAR | Status: AC
  Filled 2021-07-03: qty 1

## 2021-07-03 MED ORDER — ONDANSETRON HCL 4 MG PO TABS: 4.0000 mg | ORAL_TABLET | Freq: Four times a day (QID) | ORAL | 0 refills | Status: DC | PRN

## 2021-07-03 MED ORDER — TRAZODONE HCL 50 MG PO TABS: 50.0000 mg | ORAL_TABLET | Freq: Every evening | ORAL | Status: DC | PRN

## 2021-07-03 SURGICAL SUPPLY — 47 items
APPLICATOR COTTON TIP 6 STRL (MISCELLANEOUS) ×1 IMPLANT
APPLICATOR COTTON TIP 6IN STRL (MISCELLANEOUS) ×2 IMPLANT
APPLIER CLIP ROT 10 11.4 M/L (STAPLE) ×2
APPLIER CLIP ROT 13.4 12 LRG (CLIP) ×2
BAG RETRIEVAL 10 (BASKET) ×1
BLADE SURG 15 STRL LF DISP TIS (BLADE) ×1 IMPLANT
BLADE SURG 15 STRL SS (BLADE) ×1
CATH CHOLANGIOGRAM 4.5FR (CATHETERS) IMPLANT
CHLORAPREP W/TINT 26 (MISCELLANEOUS) ×2 IMPLANT
CLIP APPLIE ROT 10 11.4 M/L (STAPLE) ×1 IMPLANT
CLIP APPLIE ROT 13.4 12 LRG (CLIP) ×1 IMPLANT
CLOTH BEACON ORANGE TIMEOUT ST (SAFETY) ×2 IMPLANT
COVER LIGHT HANDLE STERIS (MISCELLANEOUS) ×4 IMPLANT
DERMABOND ADVANCED (GAUZE/BANDAGES/DRESSINGS) ×1
DERMABOND ADVANCED .7 DNX12 (GAUZE/BANDAGES/DRESSINGS) ×1 IMPLANT
DRAPE C-ARM FOLDED MOBILE STRL (DRAPES) ×2 IMPLANT
ELECT REM PT RETURN 9FT ADLT (ELECTROSURGICAL) ×2
ELECTRODE REM PT RTRN 9FT ADLT (ELECTROSURGICAL) ×1 IMPLANT
GLOVE SURG ENC MOIS LTX SZ6.5 (GLOVE) ×2 IMPLANT
GLOVE SURG POLYISO LF SZ7.5 (GLOVE) ×4 IMPLANT
GLOVE SURG UNDER POLY LF SZ6.5 (GLOVE) ×2 IMPLANT
GLOVE SURG UNDER POLY LF SZ7 (GLOVE) ×6 IMPLANT
GOWN STRL REUS W/TWL LRG LVL3 (GOWN DISPOSABLE) ×6 IMPLANT
HEMOSTAT SNOW SURGICEL 2X4 (HEMOSTASIS) ×2 IMPLANT
INST SET LAPROSCOPIC AP (KITS) ×2 IMPLANT
KIT TURNOVER KIT A (KITS) ×2 IMPLANT
MANIFOLD NEPTUNE II (INSTRUMENTS) ×2 IMPLANT
NEEDLE INSUFFLATION 120MM (ENDOMECHANICALS) ×2 IMPLANT
NS IRRIG 1000ML POUR BTL (IV SOLUTION) ×2 IMPLANT
PACK LAP CHOLE LZT030E (CUSTOM PROCEDURE TRAY) ×2 IMPLANT
PAD ARMBOARD 7.5X6 YLW CONV (MISCELLANEOUS) ×2 IMPLANT
SET BASIN LINEN APH (SET/KITS/TRAYS/PACK) ×2 IMPLANT
SET TUBE IRRIG SUCTION NO TIP (IRRIGATION / IRRIGATOR) IMPLANT
SET TUBE SMOKE EVAC HIGH FLOW (TUBING) ×2 IMPLANT
SLEEVE ENDOPATH XCEL 5M (ENDOMECHANICALS) ×2 IMPLANT
SUT MNCRL AB 4-0 PS2 18 (SUTURE) ×4 IMPLANT
SUT VICRYL 0 UR6 27IN ABS (SUTURE) ×2 IMPLANT
SYR 20ML LL LF (SYRINGE) ×2 IMPLANT
SYR 30ML LL (SYRINGE) ×2 IMPLANT
SYR CONTROL 10ML LL (SYRINGE) ×2 IMPLANT
SYS BAG RETRIEVAL 10MM (BASKET) ×1
SYSTEM BAG RETRIEVAL 10MM (BASKET) ×1 IMPLANT
TROCAR ENDO BLADELESS 11MM (ENDOMECHANICALS) ×2 IMPLANT
TROCAR XCEL NON-BLD 5MMX100MML (ENDOMECHANICALS) ×2 IMPLANT
TROCAR XCEL UNIV SLVE 11M 100M (ENDOMECHANICALS) ×2 IMPLANT
TUBE CONNECTING 12X1/4 (SUCTIONS) ×2 IMPLANT
WARMER LAPAROSCOPE (MISCELLANEOUS) ×2 IMPLANT

## 2021-07-03 NOTE — Discharge Instructions (Addendum)
Discharge Laparoscopic Surgery Instructions:  Will do a post operative phone call on 07/19/2021 and also will plan to order liver test at that time to ensure you liver test have come down as I was not able to do the dye study to look at your bile system.   Common Complaints: Right shoulder pain is common after laparoscopic surgery. This is secondary to the gas used in the surgery being trapped under the diaphragm.  Walk to help your body absorb the gas. This will improve in a few days. Pain at the port sites are common, especially the larger port sites. This will improve with time.  Some nausea is common and poor appetite. The main goal is to stay hydrated the first few days after surgery.   Diet/ Activity: Diet as tolerated. You may not have an appetite, but it is important to stay hydrated. Drink 64 ounces of water a day. Your appetite will return with time.  Shower per your regular routine daily.  Do not take hot showers. Take warm showers that are less than 10 minutes. Rest and listen to your body, but do not remain in bed all day.  Walk everyday for at least 15-20 minutes. Deep cough and move around every 1-2 hours in the first few days after surgery.  Do not lift > 10 lbs, perform excessive bending, pushing, pulling, squatting for 1-2 weeks after surgery.  Do not pick at the dermabond glue on your incision sites.  This glue film will remain in place for 1-2 weeks and will start to peel off.  Do not place lotions or balms on your incision unless instructed to specifically by Dr. Henreitta Leber.   Pain Expectations and Narcotics: -After surgery you will have pain associated with your incisions and this is normal. The pain is muscular and nerve pain, and will get better with time. -You are encouraged and expected to take non narcotic medications like tylenol and ibuprofen (when able) to treat pain as multiple modalities can aid with pain treatment. -Narcotics are only used when pain is severe or  there is breakthrough pain. -You are not expected to have a pain score of 0 after surgery, as we cannot prevent pain. A pain score of 3-4 that allows you to be functional, move, walk, and tolerate some activity is the goal. The pain will continue to improve over the days after surgery and is dependent on your surgery. -Due to [place] law, we are only able to give a certain amount of pain medication to treat post operative pain, and we only give additional narcotics on a patient by patient basis.  -For most laparoscopic surgery, studies have shown that the majority of patients only need 10-15 narcotic pills, and for open surgeries most patients only need 15-20.   -Having appropriate expectations of pain and knowledge of pain management with non narcotics is important as we do not want anyone to become addicted to narcotic pain medication.  -Using ice packs in the first 48 hours and heating pads after 48 hours, wearing an abdominal binder (when recommended), and using over the counter medications are all ways to help with pain management.   -Simple acts like meditation and mindfulness practices after surgery can also help with pain control and research has proven the benefit of these practices.  Medication: Take tylenol and ibuprofen as needed for pain control, alternating every 4-6 hours.  Example:  Tylenol 1000mg  @ 6am, 12noon, 6pm, (Do not exceed 4000mg  of tylenol a day). Ibuprofen  800mg  @ 9am, 3pm, 9pm, 3am (Do not exceed 3600mg  of ibuprofen a day).  Take Roxicodone for breakthrough pain every 4 hours.  Take Colace for constipation related to narcotic pain medication. If you do not have a bowel movement in 2 days, take Miralax over the counter.  Drink plenty of water to also prevent constipation.   Contact Information: If you have questions or concerns, please call our office, [phone], Monday- Thursday 8AM-5PM and Friday 8AM-12Noon.  If it is after hours or on the weekend, please call  [facility] Main Number, [phone], [phone], and ask to speak to the surgeon on call for Dr. [phone] at [facility].

## 2021-07-03 NOTE — Anesthesia Preprocedure Evaluation (Addendum)
Anesthesia Evaluation  Patient identified by MRN, date of birth, ID band Patient awake    Reviewed: Allergy & Precautions, H&P , NPO status , Patient's Chart, lab work & pertinent test results, reviewed documented beta blocker date and time   Airway Mallampati: II  TM Distance: >3 FB Neck ROM: full    Dental  (+) Dental Advisory Given, Missing, Poor Dentition, Chipped,    Pulmonary asthma , Current Smoker,    Pulmonary exam normal breath sounds clear to auscultation       Cardiovascular Exercise Tolerance: Good negative cardio ROS   Rhythm:regular Rate:Normal     Neuro/Psych  Headaches, PSYCHIATRIC DISORDERS Anxiety Depression    GI/Hepatic Neg liver ROS, GERD  Medicated,  Endo/Other  negative endocrine ROS  Renal/GU negative Renal ROS  negative genitourinary   Musculoskeletal   Abdominal   Peds  Hematology negative hematology ROS (+)   Anesthesia Other Findings   Reproductive/Obstetrics negative OB ROS                            Anesthesia Physical Anesthesia Plan  ASA: 2  Anesthesia Plan: General and General ETT   Post-op Pain Management:    Induction:   PONV Risk Score and Plan: Ondansetron and Scopolamine patch - Pre-op  Airway Management Planned:   Additional Equipment:   Intra-op Plan:   Post-operative Plan:   Informed Consent: I have reviewed the patients History and Physical, chart, labs and discussed the procedure including the risks, benefits and alternatives for the proposed anesthesia with the patient or authorized representative who has indicated his/her understanding and acceptance.     Dental Advisory Given  Plan Discussed with: CRNA  Anesthesia Plan Comments:         Anesthesia Quick Evaluation

## 2021-07-03 NOTE — Discharge Summary (Signed)
Physician Discharge Summary  Patient ID: Lacey Bentley. Lacey Bentley MRN: [id number] DOB/AGE: [birth year] 30 y.o.  Admit date: 07/02/2021 Discharge date: 07/04/2021  Admission Diagnoses: Acute cholecystitis   Discharge Diagnoses:  Principal Problem:   Acute calculous cholecystitis   Discharged Condition: fair  Hospital Course: Lacey Bentley is a 30 yo with acute cholecystitis who underwent a cholecystectomy. I had planned for possible cholangiogram but this was not able to be performed due to anatomy. She had nausea, vomiting and pain control issues, and did not discharge after surgery. She stayed overnight and was feeling better. She was able to eat and having her pain controlled before she went home. Repeat LFTs were trending down but not normalized.   Consults:  hospitalist admission- surgery took over  Significant Diagnostic Studies:  Results for Lacey, Bentley (MRN [id number]) as of 07/04/2021 16:23  Ref. Range 07/04/2021 04:58  Sodium Latest Ref Range: 135 - 145 mmol/L 136  Potassium Latest Ref Range: 3.5 - 5.1 mmol/L 3.8  Chloride Latest Ref Range: 98 - 111 mmol/L 106  CO2 Latest Ref Range: 22 - 32 mmol/L 24  Glucose Latest Ref Range: 70 - 99 mg/dL 96  BUN Latest Ref Range: 6 - 20 mg/dL 5 (L)  Creatinine Latest Ref Range: 0.44 - 1.00 mg/dL 2.95  Calcium Latest Ref Range: 8.9 - 10.3 mg/dL 8.6 (L)  Anion gap Latest Ref Range: 5 - 15  6  Magnesium Latest Ref Range: 1.7 - 2.4 mg/dL 2.0  Alkaline Phosphatase Latest Ref Range: 38 - 126 U/L 67  Albumin Latest Ref Range: 3.5 - 5.0 g/dL 3.3 (L)  AST Latest Ref Range: 15 - 41 U/L 137 (H)  ALT Latest Ref Range: 0 - 44 U/L 244 (H)  Total Protein Latest Ref Range: 6.5 - 8.1 g/dL 6.1 (L)  Total Bilirubin Latest Ref Range: 0.3 - 1.2 mg/dL 1.0  GFR, Estimated Latest Ref Range: >60 mL/min >60  WBC Latest Ref Range: 4.0 - 10.5 K/uL 9.1  RBC Latest Ref Range: 3.87 - 5.11 MIL/uL 3.44 (L)  Hemoglobin Latest Ref Range: 12.0 - 15.0 g/dL 9.6 (L)  HCT Latest  Ref Range: 36.0 - 46.0 % 30.8 (L)  MCV Latest Ref Range: 80.0 - 100.0 fL 89.5  MCH Latest Ref Range: 26.0 - 34.0 pg 27.9  MCHC Latest Ref Range: 30.0 - 36.0 g/dL 18.8  RDW Latest Ref Range: 11.5 - 15.5 % 14.5  Platelets Latest Ref Range: 150 - 400 K/uL 243  nRBC Latest Ref Range: 0.0 - 0.2 % 0.0   Treatments: surgery: laparoscopic cholecystectomy, IV zosyn   Discharge Exam: Blood pressure 119/73, pulse 60, temperature 97.6 F (36.4 C), resp. rate 16, height 5\' 2"  (1.575 m), weight 50.8 kg, last menstrual period 07/02/2021, SpO2 98 %, unknown if currently breastfeeding. General appearance: alert, cooperative, and no distress Resp: normal work of breathing GI: soft, nondistended, appropriately tender, dermabond c/d/i  Disposition: Discharge disposition: 01-Home or Self Care      Discharge Instructions     Call MD for:  difficulty breathing, headache or visual disturbances   Complete by: As directed    Call MD for:  extreme fatigue   Complete by: As directed    Call MD for:  hives   Complete by: As directed    Call MD for:  persistant dizziness or light-headedness   Complete by: As directed    Call MD for:  persistant nausea and vomiting   Complete by: As directed    Call MD for:  redness, tenderness, or signs of infection (pain, swelling, redness, odor or green/yellow discharge around incision site)   Complete by: As directed    Call MD for:  severe uncontrolled pain   Complete by: As directed    Call MD for:  temperature >100.4   Complete by: As directed    Increase activity slowly   Complete by: As directed       Allergies as of 07/04/2021       Reactions   Sunscreens Rash   Sunscreens Rash        Medication List     STOP taking these medications    ondansetron 4 MG disintegrating tablet Commonly known as: Zofran ODT   oxyCODONE-acetaminophen 5-325 MG tablet Commonly known as: PERCOCET/ROXICET       TAKE these medications    famotidine 20 MG  tablet Commonly known as: PEPCID Take 1 tablet (20 mg total) by mouth 2 (two) times daily.   ferrous sulfate 325 (65 FE) MG tablet Take 325 mg by mouth daily.   ibuprofen 800 MG tablet Commonly known as: ADVIL Take 1 tablet (800 mg total) by mouth every 8 (eight) hours. What changed:  when to take this additional instructions   Levonorgestrel-Ethinyl Estradiol 0.15-0.03 &0.01 MG tablet Commonly known as: AMETHIA Take 1 tablet by mouth at bedtime.   multivitamin-prenatal 27-0.8 MG Tabs tablet Take 1 tablet by mouth daily at 12 noon.   ondansetron 4 MG tablet Commonly known as: ZOFRAN Take 1 tablet (4 mg total) by mouth every 6 (six) hours as needed for nausea.   OVER THE COUNTER MEDICATION Take 1 tablet by mouth daily. Anxiety and stress   oxyCODONE 5 MG immediate release tablet Commonly known as: Oxy IR/ROXICODONE Take 1 tablet (5 mg total) by mouth every 4 (four) hours as needed for severe pain.        Follow-up Information     Lucretia Roers, MD Follow up on 07/19/2021.   Specialty: General Surgery Why: post op phone call, if you need to be seen in person call the office Contact information: [address] [place] [facility] [address] [phone]                 Signed: Lucretia Roers 07/04/2021, 4:24 PM

## 2021-07-03 NOTE — Progress Notes (Addendum)
[facility]  Surgery completed. Unable to do cholangiogram due to large and short cystic duct. Will need outpatient LFT to confirm trending down. If she did pass any stones this would likely all pass given the size of the CBD and cystic. Patient had told staff for [place] to only call someone if complication.   Can d/c home if doing ok after surgery. [facility] [address] where Rx sent and closes at 8pm.  Patient has 42 month old twins but does not breastfeed. She works as a Child psychotherapist. I wrote a note to return to work 9/26 but if she needs this changed she can call the office.   Algis Greenhouse, MD [facility] [address] [place], [place] [address] [phone] (office)

## 2021-07-03 NOTE — Progress Notes (Signed)
PROGRESS NOTE    Lacey Bentley  VEL:[id number] DOB: Feb 02, 1991 DOA: 07/02/2021 PCP: Patient, No Pcp Per (Inactive)   Brief Narrative:  30 year old with history of anxiety, depression, gallstones comes to the hospital complains of abdominal pain, nausea and occasional vomiting.  She was diagnosed with acute calculus cholecystitis Right upper quadrant ultrasound, general surgery was consulted.  In 2019 she was diagnosed with choledocholithiasis due to symptoms of jaundice, underwent ERCP with biliary sphincterotomy and balloon extraction.   Assessment & Plan:   Active Problems:   Acute calculous cholecystitis   Acute calculus cholecystitis -Currently NPO.  Right upper quadrant ultrasound confirms this.  General surgery consulted - Pain control, antiemetics.  IV fluids -Continue IV Zosyn     DVT prophylaxis: SCDs Code Status: Full code  Patient will be under General Surgery service. Discussed with Dr Henreitta Leber.     Nutritional status           Body mass index is 20.5 kg/m.           Subjective: Briefly visited patient this morning, she did not have any complaints.  Overall felt okay.  Review of Systems Otherwise negative except as per HPI, including: General: Denies fever, chills, night sweats or unintended weight loss. Resp: Denies cough, wheezing, shortness of breath. Cardiac: Denies chest pain, palpitations, orthopnea, paroxysmal nocturnal dyspnea. GI: Denies abdominal pain, nausea, vomiting, diarrhea or constipation GU: Denies dysuria, frequency, hesitancy or incontinence MS: Denies muscle aches, joint pain or swelling Neuro: Denies headache, neurologic deficits (focal weakness, numbness, tingling), abnormal gait Psych: Denies anxiety, depression, SI/HI/AVH Skin: Denies new rashes or lesions ID: Denies sick contacts, exotic exposures, travel  Examination:  General exam: Appears calm and comfortable  Respiratory system: Clear to auscultation.  Respiratory effort normal. Cardiovascular system: S1 & S2 heard, RRR. No JVD, murmurs, rubs, gallops or clicks. No pedal edema. Gastrointestinal system: Tenderness to deep palpation in the right upper quadrant Central nervous system: Alert and oriented. No focal neurological deficits. Extremities: Symmetric 5 x 5 power. Skin: No rashes, lesions or ulcers Psychiatry: Judgement and insight appear normal. Mood & affect appropriate.     Objective: Vitals:   07/02/21 2130 07/02/21 2203 07/03/21 0200 07/03/21 0624  BP: 108/74 111/77 (!) 89/52 (!) 93/49  Pulse: 66 (!) 54 (!) 52 68  Resp: 16 16 16 16   Temp:  98.1 F (36.7 C) 98 F (36.7 C) 98.4 F (36.9 C)  TempSrc: Oral Oral Oral   SpO2: 100% 100% 100% 100%  Weight:  50.8 kg    Height:  5\' 2"  (1.575 m)      Intake/Output Summary (Last 24 hours) at 07/03/2021 0749 Last data filed at 07/03/2021 0600 Gross per 24 hour  Intake 1245 ml  Output --  Net 1245 ml   Filed Weights   07/02/21 1402 07/02/21 2203  Weight: 49.8 kg 50.8 kg     Data Reviewed:   CBC: Recent Labs  Lab 07/02/21 1416 07/03/21 0502  WBC 11.8* 5.0  HGB 11.7* 9.6*  HCT 37.1 30.7*  MCV 87.9 88.7  PLT 331 246   Basic Metabolic Panel: Recent Labs  Lab 07/02/21 1416 07/03/21 0502  NA 136 138  K 3.7 3.9  CL 103 109  CO2 25 26  GLUCOSE 106* 122*  BUN 10 7  CREATININE 0.81 0.62  CALCIUM 9.0 8.7*   GFR: Estimated Creatinine Clearance: 82.1 mL/min (by C-G formula based on SCr of 0.62 mg/dL). Liver Function Tests: Recent Labs  Lab 07/02/21 1416  07/03/21 0502  AST 70* 192*  ALT 65* 216*  ALKPHOS 47 55  BILITOT 0.5 1.6*  PROT 7.7 6.1*  ALBUMIN 4.1 3.4*   Recent Labs  Lab 07/02/21 1416  LIPASE 36   No results for input(s): AMMONIA in the last 168 hours. Coagulation Profile: No results for input(s): INR, PROTIME in the last 168 hours. Cardiac Enzymes: No results for input(s): CKTOTAL, CKMB, CKMBINDEX, TROPONINI in the last 168 hours. BNP  (last 3 results) No results for input(s): PROBNP in the last 8760 hours. HbA1C: No results for input(s): HGBA1C in the last 72 hours. CBG: No results for input(s): GLUCAP in the last 168 hours. Lipid Profile: No results for input(s): CHOL, HDL, LDLCALC, TRIG, CHOLHDL, LDLDIRECT in the last 72 hours. Thyroid Function Tests: No results for input(s): TSH, T4TOTAL, FREET4, T3FREE, THYROIDAB in the last 72 hours. Anemia Panel: No results for input(s): VITAMINB12, FOLATE, FERRITIN, TIBC, IRON, RETICCTPCT in the last 72 hours. Sepsis Labs: No results for input(s): PROCALCITON, LATICACIDVEN in the last 168 hours.  Recent Results (from the past 240 hour(s))  Resp Panel by RT-PCR (Flu A&B, Covid) Nasopharyngeal Swab     Status: None   Collection Time: 07/02/21  6:45 PM   Specimen: Nasopharyngeal Swab; Nasopharyngeal(NP) swabs in vial transport medium  Result Value Ref Range Status   SARS Coronavirus 2 by RT PCR NEGATIVE NEGATIVE Final    Comment: (NOTE) SARS-CoV-2 target nucleic acids are NOT DETECTED.  The SARS-CoV-2 RNA is generally detectable in upper respiratory specimens during the acute phase of infection. The lowest concentration of SARS-CoV-2 viral copies this assay can detect is 138 copies/mL. A negative result does not preclude SARS-Cov-2 infection and should not be used as the sole basis for treatment or other patient management decisions. A negative result may occur with  improper specimen collection/handling, submission of specimen other than nasopharyngeal swab, presence of viral mutation(s) within the areas targeted by this assay, and inadequate number of viral copies(<138 copies/mL). A negative result must be combined with clinical observations, patient history, and epidemiological information. The expected result is Negative.  Fact Sheet for Patients:  BloggerCourse.com  Fact Sheet for Healthcare Providers:   SeriousBroker.it  This test is no t yet approved or cleared by the [place] FDA and  has been authorized for detection and/or diagnosis of SARS-CoV-2 by FDA under an Emergency Use Authorization (EUA). This EUA will remain  in effect (meaning this test can be used) for the duration of the COVID-19 declaration under Section 564(b)(1) of the Act, 21 U.S.C.section 360bbb-3(b)(1), unless the authorization is terminated  or revoked sooner.       Influenza A by PCR NEGATIVE NEGATIVE Final   Influenza B by PCR NEGATIVE NEGATIVE Final    Comment: (NOTE) The Xpert Xpress SARS-CoV-2/FLU/RSV plus assay is intended as an aid in the diagnosis of influenza from Nasopharyngeal swab specimens and should not be used as a sole basis for treatment. Nasal washings and aspirates are unacceptable for Xpert Xpress SARS-CoV-2/FLU/RSV testing.  Fact Sheet for Patients: BloggerCourse.com  Fact Sheet for Healthcare Providers: SeriousBroker.it  This test is not yet approved or cleared by the [place] FDA and has been authorized for detection and/or diagnosis of SARS-CoV-2 by FDA under an Emergency Use Authorization (EUA). This EUA will remain in effect (meaning this test can be used) for the duration of the COVID-19 declaration under Section 564(b)(1) of the Act, 21 U.S.C. section 360bbb-3(b)(1), unless the authorization is terminated or revoked.  Performed at  [facility], [address] [place], [place] [address]   Blood culture (routine x 2)     Status: None (Preliminary result)   Collection Time: 07/02/21  7:01 PM   Specimen: BLOOD LEFT ARM  Result Value Ref Range Status   Specimen Description BLOOD LEFT ARM  Final   Special Requests   Final    Blood Culture adequate volume BOTTLES DRAWN AEROBIC AND ANAEROBIC Performed at [facility], [address], [place], [place] [address]    Culture PENDING  Incomplete    Report Status PENDING  Incomplete  Blood culture (routine x 2)     Status: None (Preliminary result)   Collection Time: 07/02/21  7:01 PM   Specimen: BLOOD RIGHT ARM  Result Value Ref Range Status   Specimen Description BLOOD RIGHT ARM  Final   Special Requests   Final    Blood Culture results may not be optimal due to an excessive volume of blood received in culture bottles BOTTLES DRAWN AEROBIC AND ANAEROBIC Performed at [facility], [address] [place], [place] [address]    Culture PENDING  Incomplete   Report Status PENDING  Incomplete  Surgical pcr screen     Status: None   Collection Time: 07/02/21 11:23 PM   Specimen: Nasal Mucosa; Nasal Swab  Result Value Ref Range Status   MRSA, PCR NEGATIVE NEGATIVE Final   Staphylococcus aureus NEGATIVE NEGATIVE Final    Comment: (NOTE) The Xpert SA Assay (FDA approved for NASAL specimens in patients 27 years of age and older), is one component of a comprehensive surveillance program. It is not intended to diagnose infection nor to guide or monitor treatment. Performed at [facility], [address] [place], [place] [address]          Radiology Studies: US Abdomen Limited  Result Date: 07/02/2021 CLINICAL DATA:  Right upper quadrant pain one month following Caesarean section EXAM: ULTRASOUND ABDOMEN LIMITED RIGHT UPPER QUADRANT COMPARISON:  None. FINDINGS: Gallbladder: Well distended with multiple gallstones. Wall is mildly thickened at 4 mm. Positive sonographic Murphy's sign is elicited. Common bile duct: Diameter: 9 mm. Sludge is noted within the gallbladder. No definitive stones are seen. Liver: No focal lesion identified. Within normal limits in parenchymal echogenicity. Portal vein is patent on color Doppler imaging with normal direction of blood flow towards the liver. Other: None. IMPRESSION: Findings consistent with acute calculus cholecystitis. Electronically Signed   By: Alcide Clever M.D.   On: 07/02/2021 17:41         Scheduled Meds:  Levonorgestrel-Ethinyl Estradiol  1 tablet Oral QHS   Continuous Infusions:  dextrose 5 % and 0.9 % NaCl with KCl 20 mEq/L 100 mL/hr at 07/02/21 2221   piperacillin-tazobactam (ZOSYN)  IV 3.375 g (07/03/21 0400)     LOS: 1 day   Time spent= 35 mins    Dorrien Grunder Joline Maxcy, MD [facility]  If 7PM-7AM, please contact night-coverage  07/03/2021, 7:49 AM

## 2021-07-03 NOTE — Consult Note (Signed)
[facility] Consult  Reason for Consult: Acute cholecystitis  Referring Physician: ED  Chief Complaint   Abdominal Pain     HPI: Lacey Bentley is a 30 y.o. female with acute cholecystitis on imaging that has prior episodes of choledocholithiasis 3 years ago. She had a ERCP then and did not want to stay for the cholecystectomy because it was going to several days before her procedure. She was going to get this as an outpatient but says that she did not get information from the surgeon.  She reports having pain and says that she has not really had bad episodes since her last episode. She says that her pain is improved with some pain medication. Her LFTs are going up this AM. She has had minor nausea with pain but nothing major.   Past Medical History:  Diagnosis Date   Anxiety    Asthma    Childhood   Depression    Headache    History of preterm delivery    DELIVERED 5 WKS EARLY    Past Surgical History:  Procedure Laterality Date   CESAREAN SECTION MULTI-GESTATIONAL N/A 11/24/2020   Procedure: CESAREAN SECTION MULTI-GESTATIONAL;  Surgeon: Linzie Collin, MD;  Location: [facility] ORS;  Service: Obstetrics;  Laterality: N/A;   ENDOSCOPIC RETROGRADE CHOLANGIOPANCREATOGRAPHY (ERCP) WITH PROPOFOL N/A 01/15/2018   Procedure: ENDOSCOPIC RETROGRADE CHOLANGIOPANCREATOGRAPHY (ERCP) WITH PROPOFOL;  Surgeon: Midge Minium, MD;  Location: [facility] ENDOSCOPY;  Service: Endoscopy;  Laterality: N/A;   NO PAST SURGERIES      Family History  Problem Relation Age of Onset   Hypertension Father     Social History   Tobacco Use   Smoking status: Every Day    Packs/day: 0.50    Years: 2.00    Pack years: 1.00    Types: Cigarettes   Smokeless tobacco: Never  Vaping Use   Vaping Use: Never used  Substance Use Topics   Alcohol use: Not Currently   Drug use: Never    Medications: I have reviewed the patient's current medications. Prior to Admission:  Medications Prior to  Admission  Medication Sig Dispense Refill Last Dose   ferrous sulfate 325 (65 FE) MG tablet Take 325 mg by mouth daily.   Past Week   ibuprofen (ADVIL) 800 MG tablet Take 1 tablet (800 mg total) by mouth every 8 (eight) hours. (Patient taking differently: Take 800 mg by mouth See admin instructions. Every 4 to 6 hours as needed for pain) 30 tablet 0 07/01/2021   Levonorgestrel-Ethinyl Estradiol (AMETHIA) 0.15-0.03 &0.01 MG tablet Take 1 tablet by mouth at bedtime. 84 tablet 1 07/01/2021   OVER THE COUNTER MEDICATION Take 1 tablet by mouth daily. Anxiety and stress   Past Week   Prenatal Vit-Fe Fumarate-FA (MULTIVITAMIN-PRENATAL) 27-0.8 MG TABS tablet Take 1 tablet by mouth daily at 12 noon.   Past Week   famotidine (PEPCID) 20 MG tablet Take 1 tablet (20 mg total) by mouth 2 (two) times daily. (Patient not taking: No sig reported) 60 tablet 1 Not Taking   ondansetron (ZOFRAN ODT) 4 MG disintegrating tablet Take 1 tablet (4 mg total) by mouth every 6 (six) hours as needed for nausea. (Patient not taking: No sig reported) 20 tablet 0 Not Taking   oxyCODONE-acetaminophen (PERCOCET/ROXICET) 5-325 MG tablet Take 1-2 tablets by mouth every 4 (four) hours as needed for moderate pain. (Patient not taking: No sig reported) 20 tablet 0 Not Taking   Scheduled:  Levonorgestrel-Ethinyl Estradiol  1 tablet Oral QHS  Continuous:  cefoTEtan (CEFOTAN) IV     dextrose 5 % and 0.45% NaCl 75 mL/hr at 07/03/21 0942   piperacillin-tazobactam (ZOSYN)  IV 3.375 g (07/03/21 0943)   ZYS:AYTKZSWFUXNAT **OR** acetaminophen, hydrALAZINE, ipratropium-albuterol, metoprolol tartrate, morphine injection, ondansetron **OR** ondansetron (ZOFRAN) IV, oxyCODONE, polyethylene glycol, senna-docusate, traZODone  Allergies  Allergen Reactions   Sunscreens Rash   Sunscreens Rash     ROS:  A comprehensive review of systems was negative except for: Gastrointestinal: positive for abdominal pain and nausea  Blood pressure (!)  93/49, pulse 68, temperature 98.4 F (36.9 C), resp. rate 16, height 5\' 2"  (1.575 m), weight 50.8 kg, SpO2 100 %, unknown if currently breastfeeding. Physical Exam Constitutional:      Appearance: She is well-developed.  HENT:     Head: Normocephalic.  Cardiovascular:     Rate and Rhythm: Normal rate and regular rhythm.  Pulmonary:     Effort: Pulmonary effort is normal.  Abdominal:     General: There is no distension.     Palpations: Abdomen is soft.     Tenderness: There is abdominal tenderness in the right upper quadrant.  Musculoskeletal:     Comments: Moves all extremities  Skin:    General: Skin is warm and dry.  Neurological:     General: No focal deficit present.     Mental Status: She is alert and oriented to person, place, and time.  Psychiatric:        Mood and Affect: Mood normal.        Behavior: Behavior normal.    Results: Results for orders placed or performed during the hospital encounter of 07/02/21 (from the past 48 hour(s))  Pregnancy, urine     Status: None   Collection Time: 07/02/21  7:02 AM  Result Value Ref Range   Preg Test, Ur NEGATIVE NEGATIVE    Comment:        THE SENSITIVITY OF THIS METHODOLOGY IS >20 mIU/mL. Performed at [facility], [address], [place], [place]   Lipase, blood     Status: None   Collection Time: 07/02/21  2:16 PM  Result Value Ref Range   Lipase 36 11 - 51 U/L    Comment: Performed at [facility], [address], [place], [place]  Comprehensive metabolic panel     Status: Abnormal   Collection Time: 07/02/21  2:16 PM  Result Value Ref Range   Sodium 136 135 - 145 mmol/L   Potassium 3.7 3.5 - 5.1 mmol/L   Chloride 103 98 - 111 mmol/L   CO2 25 22 - 32 mmol/L   Glucose, Bld 106 (H) 70 - 99 mg/dL    Comment: Glucose reference range applies only to samples taken after fasting for at least 8 hours.   BUN 10 6 - 20 mg/dL   Creatinine, Ser 07/04/21 0.44 - 1.00 mg/dL   Calcium 9.0 8.9 - 2.70 mg/dL    Total Protein 7.7 6.5 - 8.1 g/dL   Albumin 4.1 3.5 - 5.0 g/dL   AST 70 (H) 15 - 41 U/L   ALT 65 (H) 0 - 44 U/L   Alkaline Phosphatase 47 38 - 126 U/L   Total Bilirubin 0.5 0.3 - 1.2 mg/dL   GFR, Estimated 62.3 >76 mL/min    Comment: (NOTE) Calculated using the CKD-EPI Creatinine Equation (2021)    Anion gap 8 5 - 15    Comment: Electrolytes repeated to confirm. Performed at [facility], [address],  [place], [place] [address]   CBC     Status: Abnormal   Collection Time: 07/02/21  2:16 PM  Result Value Ref Range   WBC 11.8 (H) 4.0 - 10.5 K/uL   RBC 4.22 3.87 - 5.11 MIL/uL   Hemoglobin 11.7 (L) 12.0 - 15.0 g/dL   HCT 78.2 42.3 - 53.6 %   MCV 87.9 80.0 - 100.0 fL   MCH 27.7 26.0 - 34.0 pg   MCHC 31.5 30.0 - 36.0 g/dL   RDW 14.4 31.5 - 40.0 %   Platelets 331 150 - 400 K/uL   nRBC 0.0 0.0 - 0.2 %    Comment: Performed at [facility], [address] [place], [place] [address]  Resp Panel by RT-PCR (Flu A&B, Covid) Nasopharyngeal Swab     Status: None   Collection Time: 07/02/21  6:45 PM   Specimen: Nasopharyngeal Swab; Nasopharyngeal(NP) swabs in vial transport medium  Result Value Ref Range   SARS Coronavirus 2 by RT PCR NEGATIVE NEGATIVE    Comment: (NOTE) SARS-CoV-2 target nucleic acids are NOT DETECTED.  The SARS-CoV-2 RNA is generally detectable in upper respiratory specimens during the acute phase of infection. The lowest concentration of SARS-CoV-2 viral copies this assay can detect is 138 copies/mL. A negative result does not preclude SARS-Cov-2 infection and should not be used as the sole basis for treatment or other patient management decisions. A negative result may occur with  improper specimen collection/handling, submission of specimen other than nasopharyngeal swab, presence of viral mutation(s) within the areas targeted by this assay, and inadequate number of viral copies(<138 copies/mL). A negative result must be combined with clinical observations,  patient history, and epidemiological information. The expected result is Negative.  Fact Sheet for Patients:  BloggerCourse.com  Fact Sheet for Healthcare Providers:  SeriousBroker.it  This test is no t yet approved or cleared by the [place] FDA and  has been authorized for detection and/or diagnosis of SARS-CoV-2 by FDA under an Emergency Use Authorization (EUA). This EUA will remain  in effect (meaning this test can be used) for the duration of the COVID-19 declaration under Section 564(b)(1) of the Act, 21 U.S.C.section 360bbb-3(b)(1), unless the authorization is terminated  or revoked sooner.       Influenza A by PCR NEGATIVE NEGATIVE   Influenza B by PCR NEGATIVE NEGATIVE    Comment: (NOTE) The Xpert Xpress SARS-CoV-2/FLU/RSV plus assay is intended as an aid in the diagnosis of influenza from Nasopharyngeal swab specimens and should not be used as a sole basis for treatment. Nasal washings and aspirates are unacceptable for Xpert Xpress SARS-CoV-2/FLU/RSV testing.  Fact Sheet for Patients: BloggerCourse.com  Fact Sheet for Healthcare Providers: SeriousBroker.it  This test is not yet approved or cleared by the [place] FDA and has been authorized for detection and/or diagnosis of SARS-CoV-2 by FDA under an Emergency Use Authorization (EUA). This EUA will remain in effect (meaning this test can be used) for the duration of the COVID-19 declaration under Section 564(b)(1) of the Act, 21 U.S.C. section 360bbb-3(b)(1), unless the authorization is terminated or revoked.  Performed at [facility], [address], [place], [place] [address]   Blood culture (routine x 2)     Status: None (Preliminary result)   Collection Time: 07/02/21  7:01 PM   Specimen: BLOOD LEFT ARM  Result Value Ref Range   Specimen Description BLOOD LEFT ARM    Special Requests      Blood  Culture adequate volume BOTTLES DRAWN AEROBIC  AND ANAEROBIC Performed at [facility], [address], [place], [place] [address]    Culture PENDING    Report Status PENDING   Blood culture (routine x 2)     Status: None (Preliminary result)   Collection Time: 07/02/21  7:01 PM   Specimen: BLOOD RIGHT ARM  Result Value Ref Range   Specimen Description BLOOD RIGHT ARM    Special Requests      Blood Culture results may not be optimal due to an excessive volume of blood received in culture bottles BOTTLES DRAWN AEROBIC AND ANAEROBIC Performed at [facility], [address], [place], [place] [address]    Culture PENDING    Report Status PENDING   Surgical pcr screen     Status: None   Collection Time: 07/02/21 11:23 PM   Specimen: Nasal Mucosa; Nasal Swab  Result Value Ref Range   MRSA, PCR NEGATIVE NEGATIVE   Staphylococcus aureus NEGATIVE NEGATIVE    Comment: (NOTE) The Xpert SA Assay (FDA approved for NASAL specimens in patients 8 years of age and older), is one component of a comprehensive surveillance program. It is not intended to diagnose infection nor to guide or monitor treatment. Performed at [facility], [address] [place], [place] [address]   Comprehensive metabolic panel     Status: Abnormal   Collection Time: 07/03/21  5:02 AM  Result Value Ref Range   Sodium 138 135 - 145 mmol/L   Potassium 3.9 3.5 - 5.1 mmol/L   Chloride 109 98 - 111 mmol/L   CO2 26 22 - 32 mmol/L   Glucose, Bld 122 (H) 70 - 99 mg/dL    Comment: Glucose reference range applies only to samples taken after fasting for at least 8 hours.   BUN 7 6 - 20 mg/dL   Creatinine, Ser 4.96 0.44 - 1.00 mg/dL   Calcium 8.7 (L) 8.9 - 10.3 mg/dL   Total Protein 6.1 (L) 6.5 - 8.1 g/dL   Albumin 3.4 (L) 3.5 - 5.0 g/dL   AST 759 (H) 15 - 41 U/L   ALT 216 (H) 0 - 44 U/L   Alkaline Phosphatase 55 38 - 126 U/L   Total Bilirubin 1.6 (H) 0.3 - 1.2 mg/dL   GFR, Estimated >16 >38 mL/min    Comment:  (NOTE) Calculated using the CKD-EPI Creatinine Equation (2021)    Anion gap 3 (L) 5 - 15    Comment: Performed at [facility], [address] [place], [place] [address]  CBC     Status: Abnormal   Collection Time: 07/03/21  5:02 AM  Result Value Ref Range   WBC 5.0 4.0 - 10.5 K/uL   RBC 3.46 (L) 3.87 - 5.11 MIL/uL   Hemoglobin 9.6 (L) 12.0 - 15.0 g/dL   HCT 93.5 (L) 70.1 - 77.9 %   MCV 88.7 80.0 - 100.0 fL   MCH 27.7 26.0 - 34.0 pg   MCHC 31.3 30.0 - 36.0 g/dL   RDW 39.0 30.0 - 92.3 %   Platelets 246 150 - 400 K/uL   nRBC 0.0 0.0 - 0.2 %    Comment: Performed at [facility], [address] [place], [place] [address]    Personally reviewed- gallstones and dilated CBD  US Abdomen Limited  Result Date: 07/02/2021 CLINICAL DATA:  Right upper quadrant pain one month following Caesarean section EXAM: ULTRASOUND ABDOMEN LIMITED RIGHT UPPER QUADRANT COMPARISON:  None. FINDINGS: Gallbladder: Well distended with multiple gallstones. Wall is mildly thickened at 4 mm.  Positive sonographic Murphy's sign is elicited. Common bile duct: Diameter: 9 mm. Sludge is noted within the gallbladder. No definitive stones are seen. Liver: No focal lesion identified. Within normal limits in parenchymal echogenicity. Portal vein is patent on color Doppler imaging with normal direction of blood flow towards the liver. Other: None. IMPRESSION: Findings consistent with acute calculus cholecystitis. Electronically Signed   By: Alcide Clever M.D.   On: 07/02/2021 17:41     Assessment & Plan:  Lacey Bentley is a 30 y.o. female with acute cholecystitis on [place] and elevated LFTs. She has had choledocholithiasis in the past and ERCP.   PLAN: I counseled the patient about the indication, risks and benefits of laparoscopic cholecystectomy.  She understands there is a very small chance for bleeding, infection, injury to normal structures (including common bile duct), conversion to open surgery, persistent symptoms, evolution  of postcholecystectomy diarrhea, need for secondary interventions, anesthesia reaction, cardiopulmonary issues and other risks not specifically detailed here. I described the expected recovery, the plan for follow-up and the restrictions during the recovery phase.  All questions were answered.  Also discussed plan for cholangiogram given the LFTs and potential need for repeat ERCP.    All questions were answered to the satisfaction of the patient and family. Potentially home after surgery.   Lucretia Roers 07/03/2021, 8:01 AM

## 2021-07-03 NOTE — Op Note (Signed)
Operative Note   Preoperative Diagnosis: Acute cholecystitis    Postoperative Diagnosis: Same   Procedure(s) Performed: Laparoscopic cholecystectomy   Surgeon: Lillia Abed C. Henreitta Leber, MD   Assistants: No qualified resident was available   Anesthesia: General endotracheal   Anesthesiologist: Windell Norfolk, MD    Specimens: Gallbladder    Estimated Blood Loss: Minimal    Blood Replacement: None    Complications: None    Operative Findings: Distended gallbladder with stones, short and wide cystic duct, unable to do cholangiogram due to short cystic    Procedure: The patient was taken to the operating room and placed supine. General endotracheal anesthesia was induced. Intravenous antibiotics were administered per protocol. An orogastric tube positioned to decompress the stomach. The abdomen was prepared and draped in the usual sterile fashion.    A supraumbilical incision was made and a Veress technique was utilized to achieve pneumoperitoneum to 15 mmHg with carbon dioxide. A 11 mm optiview port was placed through the supraumbilical region, and a 10 mm 0-degree operative laparoscope was introduced. The area underlying the trocar and Veress needle were inspected and without evidence of injury.  Remaining trocars were placed under direct vision. Two 5 mm ports were placed in the right abdomen, between the anterior axillary and midclavicular line.  A final 11 mm port was placed through the mid-epigastrium, near the falciform ligament.    The gallbladder fundus was elevated cephalad and the infundibulum was retracted to the patient's right. The gallbladder/cystic duct junction was skeletonized. The cystic artery noted in the triangle of Calot and was also skeletonized.  We then continued liberal medial and lateral dissection until the critical view of safety was achieved.    The cystic duct was very short and very wide. I had to use a 12 mm clip to com across it.  Given this was not able to  do a cholangiogram because I was afraid I would lose purchase to get a clip on the duct if a ductotomy was made and tore in the least bit.  and cystic artery were triply clipped and divided. The gallbladder was then dissected from the liver bed with electrocautery. The specimen was placed in an Endopouch and was retrieved through the epigastric site.   Final inspection revealed acceptable hemostasis. Surgical SNOW was placed in the gallbladder bed.  Trocars were removed and pneumoperitoneum was released.  0 Vicryl fascial sutures were used to close the epigastric and umbilical port sites. Skin incisions were closed with 4-0 Monocryl subcuticular sutures and Dermabond. The patient was awakened from anesthesia and extubated without complication.    Algis Greenhouse, MD [facility] [address] [place], [place] [address] [phone] (office)

## 2021-07-03 NOTE — Transfer of Care (Signed)
Immediate Anesthesia Transfer of Care Note  Patient: Lacey Bentley. [name]  Procedure(s) Performed: LAPAROSCOPIC CHOLECYSTECTOMY WITH POSSIBLE INTRAOPERATIVE CHOLANGIOGRAM POSSIBLE OPEN (Abdomen)  Patient Location: PACU  Anesthesia Type:General  Level of Consciousness: drowsy  Airway & Oxygen Therapy: Patient Spontanous Breathing  Post-op Assessment: Report given to RN and Post -op Vital signs reviewed and stable  Post vital signs: Reviewed and stable  Last Vitals:  Vitals Value Taken Time  BP    Temp    Pulse    Resp    SpO2      Last Pain:  Vitals:   07/03/21 1349  TempSrc: Oral  PainSc: 2       Patients Stated Pain Goal: 3 (07/03/21 1349)  Complications: No notable events documented.

## 2021-07-03 NOTE — Addendum Note (Signed)
Addendum  created 07/03/21 1831 by Windell Norfolk, MD   Order sets accessed

## 2021-07-03 NOTE — Anesthesia Postprocedure Evaluation (Signed)
Anesthesia Post Note  Patient: Lacey Bentley. Finken  Procedure(s) Performed: LAPAROSCOPIC CHOLECYSTECTOMY WITH POSSIBLE INTRAOPERATIVE CHOLANGIOGRAM POSSIBLE OPEN (Abdomen)  Patient location during evaluation: PACU Anesthesia Type: General Level of consciousness: awake and alert Pain management: pain level controlled Vital Signs Assessment: post-procedure vital signs reviewed and stable Respiratory status: spontaneous breathing, nonlabored ventilation, respiratory function stable and patient connected to nasal cannula oxygen Cardiovascular status: blood pressure returned to baseline and stable Postop Assessment: no apparent nausea or vomiting Anesthetic complications: no   No notable events documented.   Last Vitals:  Vitals:   07/03/21 1325 07/03/21 1349  BP: 113/80 113/79  Pulse: 65 70  Resp: 15 19  Temp: 36.6 C 36.8 C  SpO2: 98% 98%    Last Pain:  Vitals:   07/03/21 1349  TempSrc: Oral  PainSc: 2                  Windell Norfolk

## 2021-07-03 NOTE — Anesthesia Procedure Notes (Signed)
Procedure Name: Intubation Date/Time: 07/03/2021 5:06 PM Performed by: Windell Norfolk, MD Pre-anesthesia Checklist: Patient identified, Emergency Drugs available, Suction available, Patient being monitored and Timeout performed Patient Re-evaluated:Patient Re-evaluated prior to induction Oxygen Delivery Method: Circle system utilized Preoxygenation: Pre-oxygenation with 100% oxygen Induction Type: IV induction Laryngoscope Size: Glidescope and 3 Grade View: Grade I Tube type: Oral Tube size: 7.0 mm Number of attempts: 1 Airway Equipment and Method: Video-laryngoscopy and Stylet Placement Confirmation: ETT inserted through vocal cords under direct vision, positive ETCO2 and breath sounds checked- equal and bilateral Secured at: 22 cm Tube secured with: Tape Dental Injury: Teeth and Oropharynx as per pre-operative assessment

## 2021-07-04 ENCOUNTER — Encounter (HOSPITAL_COMMUNITY): Payer: Self-pay | Admitting: General Surgery

## 2021-07-04 LAB — CBC
HCT: 30.8 % — ABNORMAL LOW (ref 36.0–46.0)
Hemoglobin: 9.6 g/dL — ABNORMAL LOW (ref 12.0–15.0)
MCH: 27.9 pg (ref 26.0–34.0)
MCHC: 31.2 g/dL (ref 30.0–36.0)
MCV: 89.5 fL (ref 80.0–100.0)
Platelets: 243 10*3/uL (ref 150–400)
RBC: 3.44 MIL/uL — ABNORMAL LOW (ref 3.87–5.11)
RDW: 14.5 % (ref 11.5–15.5)
WBC: 9.1 10*3/uL (ref 4.0–10.5)
nRBC: 0 % (ref 0.0–0.2)

## 2021-07-04 LAB — COMPREHENSIVE METABOLIC PANEL
ALT: 244 U/L — ABNORMAL HIGH (ref 0–44)
AST: 137 U/L — ABNORMAL HIGH (ref 15–41)
Albumin: 3.3 g/dL — ABNORMAL LOW (ref 3.5–5.0)
Alkaline Phosphatase: 67 U/L (ref 38–126)
Anion gap: 6 (ref 5–15)
BUN: 5 mg/dL — ABNORMAL LOW (ref 6–20)
CO2: 24 mmol/L (ref 22–32)
Calcium: 8.6 mg/dL — ABNORMAL LOW (ref 8.9–10.3)
Chloride: 106 mmol/L (ref 98–111)
Creatinine, Ser: 0.68 mg/dL (ref 0.44–1.00)
GFR, Estimated: >60 mL/min (ref >60)
Glucose, Bld: 96 mg/dL (ref 70–99)
Potassium: 3.8 mmol/L (ref 3.5–5.1)
Sodium: 136 mmol/L (ref 135–145)
Total Bilirubin: 1 mg/dL (ref 0.3–1.2)
Total Protein: 6.1 g/dL — ABNORMAL LOW (ref 6.5–8.1)

## 2021-07-04 LAB — MAGNESIUM: Magnesium: 2 mg/dL (ref 1.7–2.4)

## 2021-07-04 MED ORDER — HEMOSTATIC AGENTS (NO CHARGE) OPTIME
TOPICAL | Status: DC | PRN
  Administered 2021-07-03: 1 via TOPICAL

## 2021-07-04 MED ORDER — KETOROLAC TROMETHAMINE 30 MG/ML IJ SOLN
30.0000 mg | Freq: Once | INTRAMUSCULAR | Status: AC
  Administered 2021-07-04: 30 mg via INTRAVENOUS
  Filled 2021-07-04: qty 1

## 2021-07-04 NOTE — Progress Notes (Signed)
Patient returned from surgery earlier in shift.  Patient decided to stay the night for pain/nausea control. Patent has vomited x 1 tonight and has had constant steady right sided abdominal/rib pain since return from surgery.

## 2021-07-04 NOTE — Progress Notes (Signed)
[facility]  Patient did not feel well to go home last night, vomited and had pain control issues. Stayed overnight. Improving and eating but still with pain. Will try toradol to see I this helps the soreness in the lateral abdomen.   LFTs down but not normalized. Will still repeat in 2 weeks.  If doing ok later today can d/c home.  Incisions c/d/I with dermabond BP 119/73 (BP Location: Left Arm)   Pulse 60   Temp 97.6 F (36.4 C)   Resp 16   Ht 5\' 2"  (1.575 m)   Wt 50.8 kg   LMP 07/02/2021 Comment: Negative pregnancy test on 07/02/21  SpO2 98%   BMI 20.50 kg/m    07/04/21, MD [facility] [address] [place], [place] [phone] (office)

## 2021-07-05 ENCOUNTER — Telehealth: Payer: Self-pay

## 2021-07-05 NOTE — Telephone Encounter (Signed)
Transition Care Management Follow-up Telephone Call Date of discharge and from where: 07/04/2021-[place] How have you been since you were released from the hospital? Patient stated she is doing ok but still sore.  Any questions or concerns? No  Items Reviewed: Did the pt receive and understand the discharge instructions provided? Yes  Medications obtained and verified? Yes  Other? No  Any new allergies since your discharge? No  Dietary orders reviewed? N/A Do you have support at home? Yes   Home Care and Equipment/Supplies: Were home health services ordered? not applicable If so, what is the name of the agency? N/A  Has the agency set up a time to come to the patient's home? not applicable Were any new equipment or medical supplies ordered?  No What is the name of the medical supply agency? N/A Were you able to get the supplies/equipment? not applicable Do you have any questions related to the use of the equipment or supplies? No  Functional Questionnaire: (I = Independent and D = Dependent) ADLs: I  Bathing/Dressing- I  Meal Prep- I  Eating- I  Maintaining continence- I  Transferring/Ambulation- I  Managing Meds- I  Follow up appointments reviewed:  PCP Hospital f/u appt confirmed? No   Specialist Hospital f/u appt confirmed? Yes  Scheduled to see Dr.Bridges on 07/19/2021 @ 4pm. Are transportation arrangements needed? No  If their condition worsens, is the pt aware to call PCP or go to the Emergency Dept.? Yes Was the patient provided with contact information for the PCP's office or ED? Yes Was to pt encouraged to call back with questions or concerns? Yes

## 2021-07-06 LAB — SURGICAL PATHOLOGY

## 2021-07-07 LAB — CULTURE, BLOOD (ROUTINE X 2)
Culture: NO GROWTH
Culture: NO GROWTH
Special Requests: ADEQUATE

## 2021-07-14 DIAGNOSIS — Z419 Encounter for procedure for purposes other than remedying health state, unspecified: Secondary | ICD-10-CM | POA: Diagnosis not present

## 2021-07-19 ENCOUNTER — Ambulatory Visit (INDEPENDENT_AMBULATORY_CARE_PROVIDER_SITE_OTHER): Payer: Medicaid Other | Admitting: General Surgery

## 2021-07-19 DIAGNOSIS — K8050 Calculus of bile duct without cholangitis or cholecystitis without obstruction: Secondary | ICD-10-CM

## 2021-07-19 NOTE — Progress Notes (Signed)
[facility]  I am calling the patient for post operative evaluation. This is not a billable encounter as it is under the global charges for the surgery.  The patient had a laparoscopic cholecystectomy on 9/21. The patient reports that she is doing well but is little constipated at times. The are tolerating a diet, having good pain control, and having regular Bms but not soft.  The incisions are healing and the glue is off. The patient has no major concerns.   Pathology: FINAL MICROSCOPIC DIAGNOSIS:   A. GALLBLADDER, CHOLECYSTECTOMY:  - Acute calculous cholecystitis.  No dysplasia or malignancy.  - Associated benign lymph node.   Will see the patient PRN.   Order CMP to make sure LFTs improved.   Algis Greenhouse, MD [facility] [address] [place], [place] [address] [phone] (office)

## 2021-08-10 NOTE — Progress Notes (Signed)
Mychart message sent.

## 2021-08-14 DIAGNOSIS — Z419 Encounter for procedure for purposes other than remedying health state, unspecified: Secondary | ICD-10-CM | POA: Diagnosis not present

## 2021-08-30 ENCOUNTER — Encounter: Payer: Self-pay | Admitting: Obstetrics and Gynecology

## 2021-09-13 DIAGNOSIS — Z419 Encounter for procedure for purposes other than remedying health state, unspecified: Secondary | ICD-10-CM | POA: Diagnosis not present

## 2021-10-14 DIAGNOSIS — Z419 Encounter for procedure for purposes other than remedying health state, unspecified: Secondary | ICD-10-CM | POA: Diagnosis not present

## 2021-11-14 DIAGNOSIS — Z419 Encounter for procedure for purposes other than remedying health state, unspecified: Secondary | ICD-10-CM | POA: Diagnosis not present

## 2021-12-12 DIAGNOSIS — Z419 Encounter for procedure for purposes other than remedying health state, unspecified: Secondary | ICD-10-CM | POA: Diagnosis not present

## 2022-01-12 DIAGNOSIS — Z419 Encounter for procedure for purposes other than remedying health state, unspecified: Secondary | ICD-10-CM | POA: Diagnosis not present

## 2022-02-11 DIAGNOSIS — Z419 Encounter for procedure for purposes other than remedying health state, unspecified: Secondary | ICD-10-CM | POA: Diagnosis not present

## 2022-03-06 IMAGING — US US MFM UA DOPPLER RE-EVAL
1 series · 14 of 28 positions shown · non-contrast
Comparison: none

[Series 1: us mfm ua doppler re-eval · 32 acquisitions, 14 frames shown]
[im 2/32]
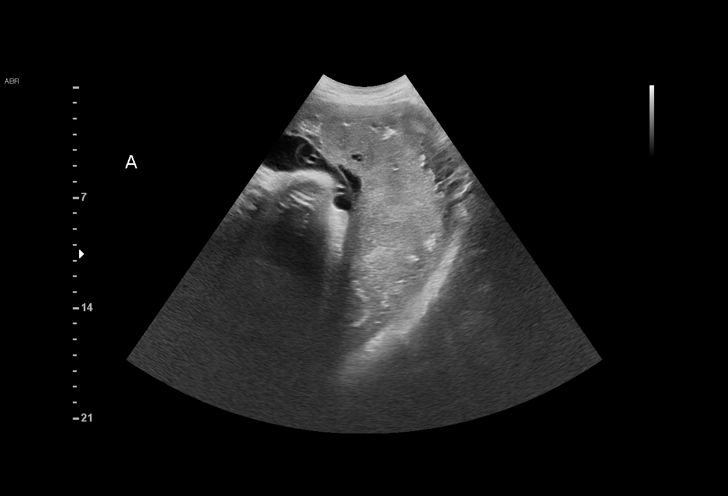
[im 4/32]
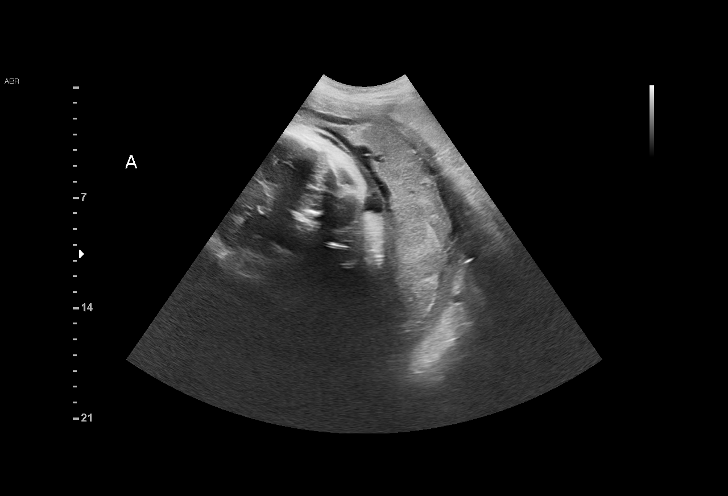
[im 6/32]
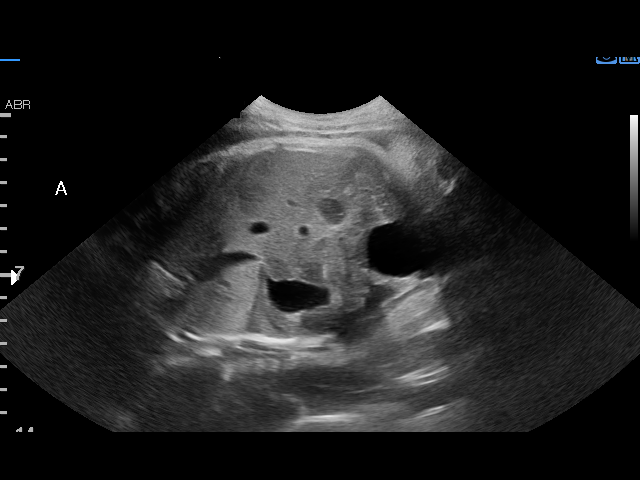
[im 9/32]
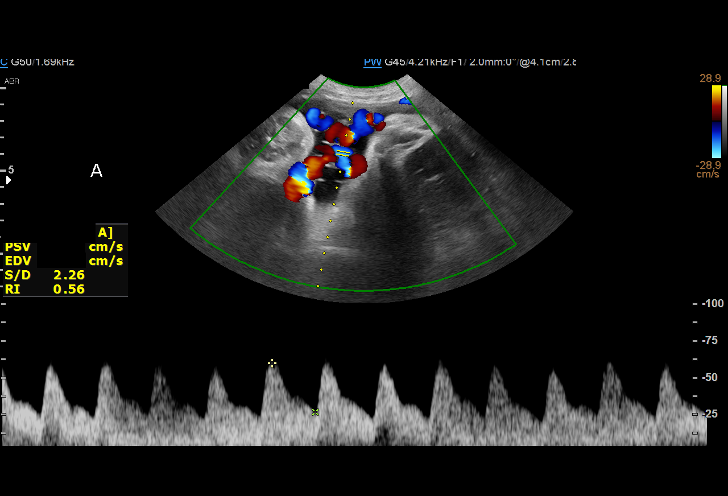
[im 11/32]
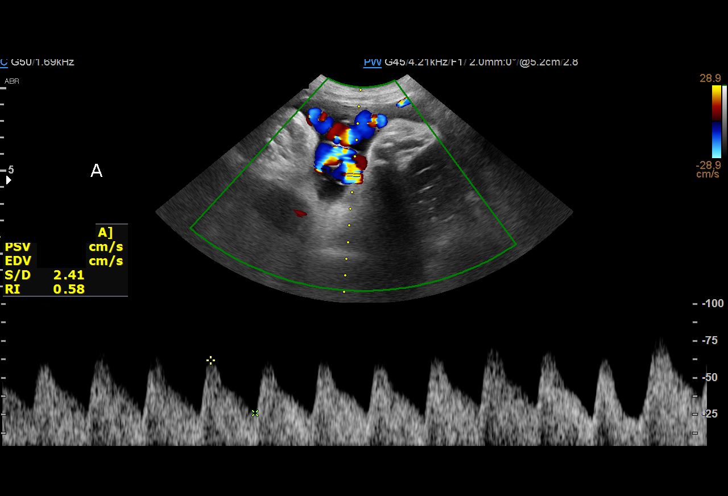
[im 13/32]
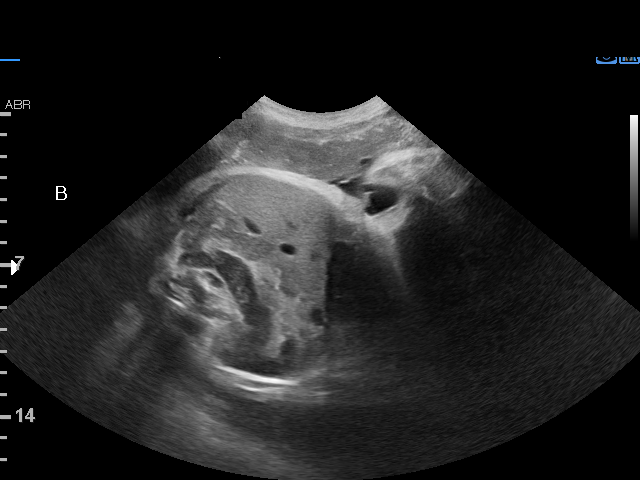
[im 15/32]
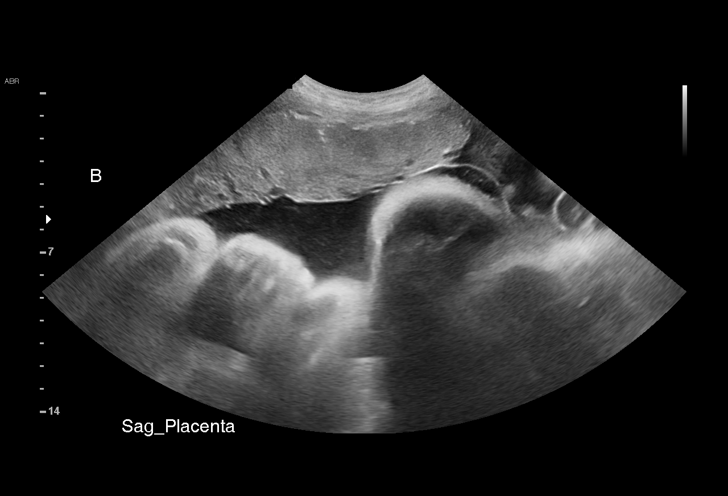
[im 18/32]
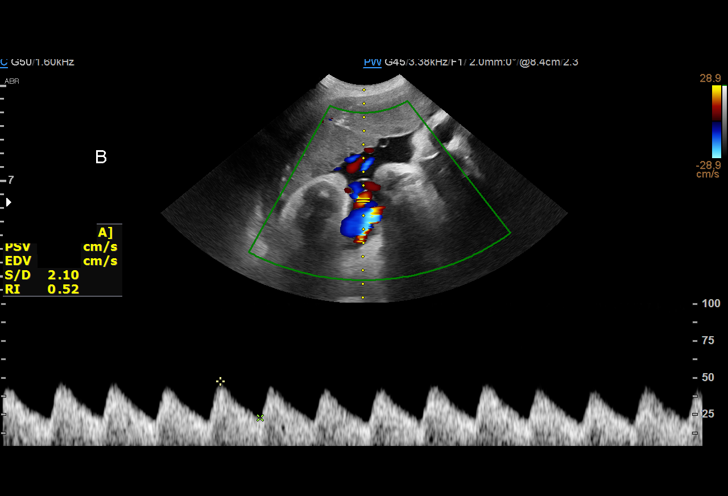
[im 20/32]
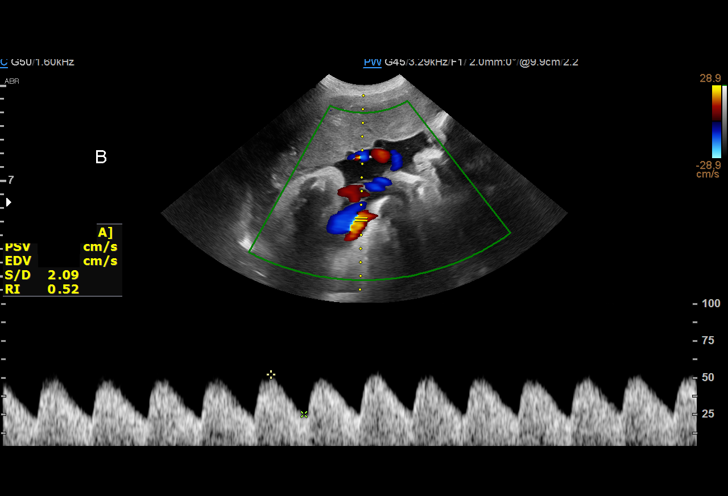
[im 22/32]
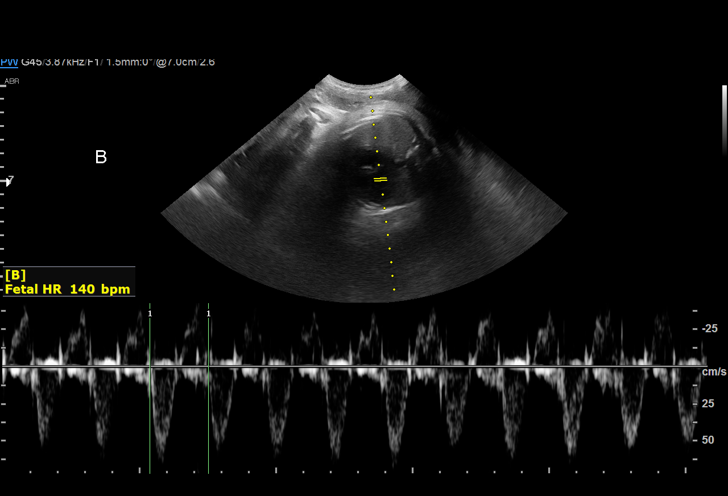
[im 25/32]
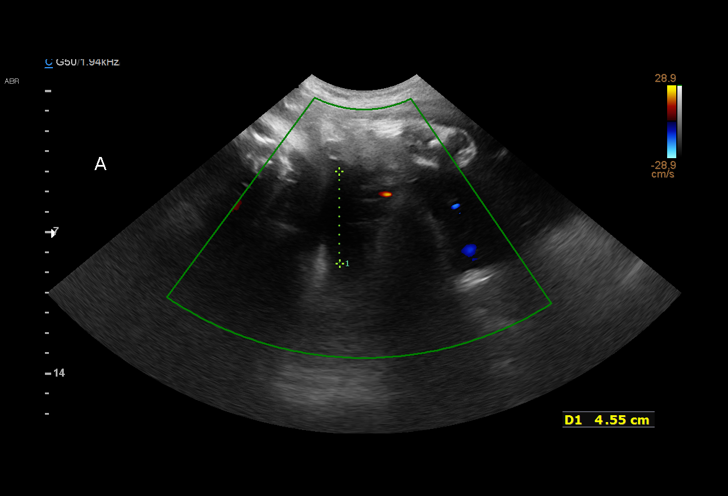
[im 27/32]
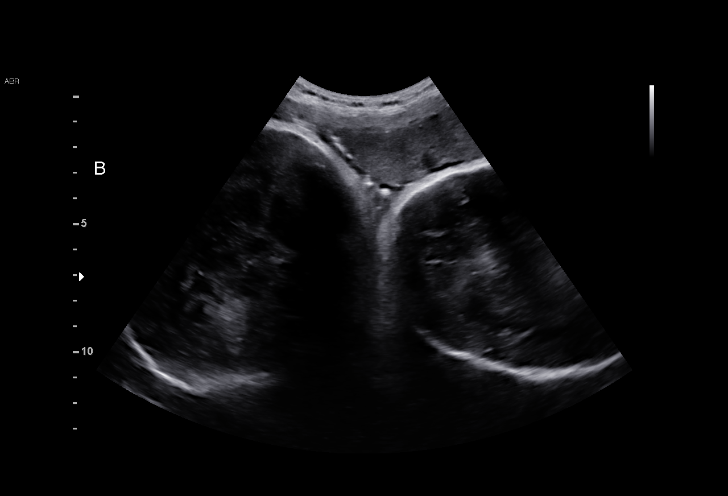
[im 29/32]
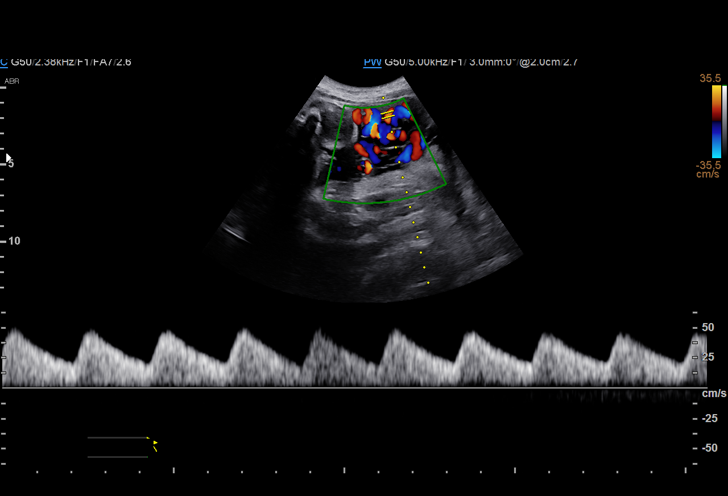
[im 32/32]
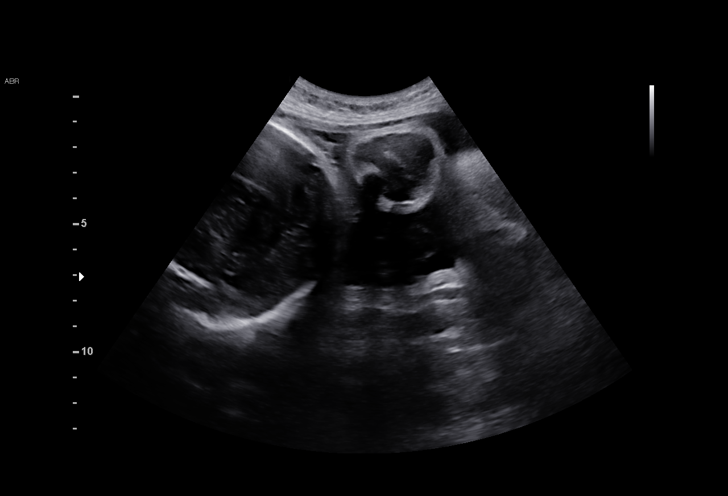

[14 of 28 positions shown; findings below may reference images not displayed]

[REDACTED] Care

     RE EVAL
     GESTATION

Indications

 Twin pregnancy, di/di, third trimester
 36 weeks gestation of pregnancy
Fetal Evaluation (Fetus A)

 Num Of Fetuses:          2
 Fetal Heart Rate(bpm):   140
 Cardiac Activity:        Observed
 Presentation:            Oblique
 Placenta:                Posterior
 Amniotic Fluid
 AFI FV:      Within normal limits
Biophysical Evaluation (Fetus A)

 Amniotic F.V:   Within normal limits        F. Tone:         Observed
 F. Movement:    Observed                    Score:           [DATE]
 F. Breathing:   Observed
OB History

 Gravidity:     10        Term:  5          Prem:  1        SAB:   1
 TOP:           2       Ectopic: 0         Living: 6
Gestational Age (Fetus A)

 Best:           36w 2d    Det. By:  Early Ultrasound           EDD:  12/17/20
                                     (05/31/20)
Doppler - Fetal Vessels (Fetus A)

 Umbilical Artery
   S/D     %tile                                              ADFV    RDFV
  2.38        50                                                 No      No

Fetal Evaluation (Fetus B)

 Num Of Fetuses:          2
 Fetal Heart Rate(bpm):   140
 Cardiac Activity:        Observed
 Presentation:            Cephalic
 Placenta:                Anterior
 P. Cord Insertion:       Previously Visualized

 Amniotic Fluid
 AFI FV:      Within normal limits
Biophysical Evaluation (Fetus B)

 Amniotic F.V:   Within normal limits        F. Tone:         Observed
 F. Movement:    Observed                    Score:           [DATE]
 F. Breathing:   Observed
Gestational Age (Fetus B)

 Best:           36w 2d    Det. By:  Early Ultrasound           EDD:  12/17/20
                                     (05/31/20)
Doppler - Fetal Vessels (Fetus B)

 Umbilical Artery
   S/D     %tile                                              ADFV    RDFV
  1.99        22                                                 No      No
Comments

 This patient was seen for a biophysical profile and umbilical
 artery Doppler studies due to a dichorionic, diamniotic twin
 gestation.  A significant discordance in the fetal weights was
 not noted during her last growth ultrasound.  The patient denies
 any problems since her last exam.  She reports feeling vigorous
 movements of both fetuses throughout the day.
 A biophysical profile performed today was [DATE] both twin
 A and twin B.
 There was normal amniotic fluid noted on today's ultrasound
 exam around twin A and twin B.
 Doppler studies of the umbilical arteries performed for both twin
 A and twin B continues to show normal forward flow.  There
 were no signs of absent or reversed end-diastolic flow noted in
 either fetus today.
 Twin A was in the breech presentation.  Twin B was vertex.
 The patient understands that a cesarean delivery may be
 necessary should twin A remain in the breech presentation.
 She will return in 1 week for another biophysical profile and
 umbilical artery Doppler study.
 Due to the dichorionic, diamniotic twin gestations, delivery may
 be considered at between 37 to 38 weeks.

## 2022-03-14 DIAGNOSIS — Z419 Encounter for procedure for purposes other than remedying health state, unspecified: Secondary | ICD-10-CM | POA: Diagnosis not present

## 2022-04-13 DIAGNOSIS — Z419 Encounter for procedure for purposes other than remedying health state, unspecified: Secondary | ICD-10-CM | POA: Diagnosis not present

## 2022-05-14 DIAGNOSIS — Z419 Encounter for procedure for purposes other than remedying health state, unspecified: Secondary | ICD-10-CM | POA: Diagnosis not present

## 2022-06-06 ENCOUNTER — Ambulatory Visit (INDEPENDENT_AMBULATORY_CARE_PROVIDER_SITE_OTHER): Payer: Medicaid Other | Admitting: Obstetrics and Gynecology

## 2022-06-06 ENCOUNTER — Encounter: Payer: Self-pay | Admitting: Obstetrics and Gynecology

## 2022-06-06 ENCOUNTER — Other Ambulatory Visit (HOSPITAL_COMMUNITY)
Admission: RE | Admit: 2022-06-06 | Discharge: 2022-06-06 | Disposition: A | Discharge status: Home / Self Care | Payer: Medicaid Other | Source: Ambulatory Visit | Attending: Obstetrics and Gynecology | Admitting: Obstetrics and Gynecology

## 2022-06-06 VITALS — BP 119/84 | HR 80 | Ht 62.0 in | Wt 112.9 lb

## 2022-06-06 DIAGNOSIS — Z3009 Encounter for other general counseling and advice on contraception: Secondary | ICD-10-CM | POA: Diagnosis not present

## 2022-06-06 DIAGNOSIS — Z202 Contact with and (suspected) exposure to infections with a predominantly sexual mode of transmission: Secondary | ICD-10-CM

## 2022-06-06 DIAGNOSIS — Z113 Encounter for screening for infections with a predominantly sexual mode of transmission: Secondary | ICD-10-CM

## 2022-06-06 NOTE — Progress Notes (Signed)
HPI:      [name] is a 31 y.o. [id number] who LMP was Patient's last menstrual period was 05/30/2022.  Subjective:   She presents today because she has a possible exposure to a partner with an STD.  He went to the health department and got tested and "treated".  His test results are not back yet.  She is requesting testing for vaginal STDs.  Declined blood work for HIV hepatitis syphilis etc.  She is asymptomatic. Patient had an abortion in July and began progesterone only OCPs.  She says she is happy with these and taking them daily.  She would like to stay on this birth control.    Hx: The following portions of the patient's history were reviewed and updated as appropriate:             She  has a past medical history of Anxiety, Asthma, Depression, Headache, and History of preterm delivery. She does not have any pertinent problems on file. She  has a past surgical history that includes Endoscopic retrograde cholangiopancreatography (ercp) with propofol (N/A, 01/15/2018); No past surgeries; Cesarean section multi-gestational (N/A, 11/24/2020); and Cholecystectomy (N/A, 07/03/2021). Her family history includes Hypertension in her father. She  reports that she has been smoking cigarettes. She has a 1.00 pack-year smoking history. She has never used smokeless tobacco. She reports current alcohol use. She reports that she does not use drugs. She has a current medication list which includes the following prescription(s): norethindrone and OVER THE COUNTER MEDICATION. She is allergic to sunscreens and sunscreens.       Review of Systems:  Review of Systems  Constitutional: Denied constitutional symptoms, night sweats, recent illness, fatigue, fever, insomnia and weight loss.  Eyes: Denied eye symptoms, eye pain, photophobia, vision change and visual disturbance.  Ears/Nose/Throat/Neck: Denied ear, nose, throat or neck symptoms, hearing loss, nasal discharge, sinus congestion and sore throat.   Cardiovascular: Denied cardiovascular symptoms, arrhythmia, chest pain/pressure, edema, exercise intolerance, orthopnea and palpitations.  Respiratory: Denied pulmonary symptoms, asthma, pleuritic pain, productive sputum, cough, dyspnea and wheezing.  Gastrointestinal: Denied, gastro-esophageal reflux, melena, nausea and vomiting.  Genitourinary: Denied genitourinary symptoms including symptomatic vaginal discharge, pelvic relaxation issues, and urinary complaints.  Musculoskeletal: Denied musculoskeletal symptoms, stiffness, swelling, muscle weakness and myalgia.  Dermatologic: Denied dermatology symptoms, rash and scar.  Neurologic: Denied neurology symptoms, dizziness, headache, neck pain and syncope.  Psychiatric: Denied psychiatric symptoms, anxiety and depression.  Endocrine: Denied endocrine symptoms including hot flashes and night sweats.   Meds:   Current Outpatient Medications on File Prior to Visit  Medication Sig Dispense Refill   norethindrone (Lacey Bentley) 0.35 MG tablet Take 1 tablet by mouth daily.     OVER THE COUNTER MEDICATION Take 1 tablet by mouth daily. Anxiety and stress     No current facility-administered medications on file prior to visit.      Objective:     Vitals:   06/06/22 0941  BP: 119/84  Pulse: 80   Filed Weights   06/06/22 0941  Weight: 112 lb 14.4 oz (51.2 kg)              Physical examination   Pelvic:   Vulva: Normal appearance.  No lesions.  Vagina: No lesions or abnormalities noted.  Support: Normal pelvic support.  Urethra No masses tenderness or scarring.  Meatus Normal size without lesions or prolapse.  Cervix: Normal appearance.  No lesions.  Anus: Normal exam.  No lesions.  Perineum: Normal exam.  No  lesions.        Bimanual   Uterus: Normal size.  Non-tender.  Mobile.  AV.  Adnexae: No masses.  Non-tender to palpation.  Cul-de-sac: Negative for abnormality.             Assessment:    [id number] Patient Active Problem  List   Diagnosis Date Noted   Acute calculous cholecystitis 07/02/2021   COVID-19 affecting pregnancy, antepartum 11/26/2020   Vaginal bleeding during pregnancy, antepartum 11/24/2020   Twin pregnancy, twins dichorionic and diamniotic 11/24/2020   Nausea and vomiting of pregnancy, antepartum 11/15/2020   Indication for care or intervention related to labor and delivery 11/14/2020   Dichorionic diamniotic twin pregnancy in second trimester 08/29/2020   Grand multipara 08/29/2020   Gastroesophageal reflux in pregnancy 08/29/2020   Abdominal pain 01/15/2018   Choledocholithiasis    Jaundice    Elevated liver enzymes    Encounter for induction of labor 08/09/2017   Late prenatal care 03/15/2017   Nausea and vomiting during pregnancy prior to [redacted] weeks gestation 03/15/2017   History of preterm delivery, currently pregnant in second trimester 03/15/2017   Irregular uterine contractions 07/08/2015     1. Screening for STD (sexually transmitted disease)   2. Birth control counseling   3. Possible exposure to STD        Plan:            1.  Nuswab performed  2.  Discussed different forms of birth control as well with combination pills patient states she is happy on the pill she is currently taking and would like to continue. Orders No orders of the defined types were placed in this encounter.   No orders of the defined types were placed in this encounter.     F/U  Return for We will contact her with any abnormal test results. I spent 18 minutes involved in the care of this patient preparing to see the patient by obtaining and reviewing her medical history (including labs, imaging tests and prior procedures), documenting clinical information in the electronic health record (EHR), counseling and coordinating care plans, writing and sending prescriptions, ordering tests or procedures and in direct communicating with the patient and medical staff discussing pertinent items from her history  and physical exam.  Elonda Husky, M.D. 06/06/2022 10:19 AM

## 2022-06-06 NOTE — Progress Notes (Signed)
Patient presents today for a STD screening and birth control counseling. She states having a new partner and is concerned about a possible exposure so she is wanting to be checked, denies any symptoms. Patient recently started daily [facility] OCP's . She likes the way her cycles are now but is open to other OCP's. No other questions at this time.

## 2022-06-07 LAB — CERVICOVAGINAL ANCILLARY ONLY
Chlamydia: NEGATIVE
Comment: NEGATIVE
Comment: NEGATIVE
Comment: NORMAL
Neisseria Gonorrhea: NEGATIVE
Trichomonas: NEGATIVE

## 2022-06-14 DIAGNOSIS — Z419 Encounter for procedure for purposes other than remedying health state, unspecified: Secondary | ICD-10-CM | POA: Diagnosis not present

## 2022-07-05 ENCOUNTER — Encounter: Payer: Self-pay | Admitting: Obstetrics and Gynecology

## 2022-07-05 ENCOUNTER — Other Ambulatory Visit: Payer: Self-pay

## 2022-07-05 DIAGNOSIS — Z3041 Encounter for surveillance of contraceptive pills: Secondary | ICD-10-CM

## 2022-07-05 MED ORDER — NORETHINDRONE 0.35 MG PO TABS: 1.0000 | ORAL_TABLET | Freq: Every day | ORAL | 10 refills | Status: DC

## 2022-07-14 DIAGNOSIS — Z419 Encounter for procedure for purposes other than remedying health state, unspecified: Secondary | ICD-10-CM | POA: Diagnosis not present

## 2022-08-14 DIAGNOSIS — Z419 Encounter for procedure for purposes other than remedying health state, unspecified: Secondary | ICD-10-CM | POA: Diagnosis not present

## 2022-09-13 DIAGNOSIS — Z419 Encounter for procedure for purposes other than remedying health state, unspecified: Secondary | ICD-10-CM | POA: Diagnosis not present

## 2022-10-14 DIAGNOSIS — Z419 Encounter for procedure for purposes other than remedying health state, unspecified: Secondary | ICD-10-CM | POA: Diagnosis not present

## 2022-11-14 DIAGNOSIS — Z419 Encounter for procedure for purposes other than remedying health state, unspecified: Secondary | ICD-10-CM | POA: Diagnosis not present

## 2022-12-13 DIAGNOSIS — Z419 Encounter for procedure for purposes other than remedying health state, unspecified: Secondary | ICD-10-CM | POA: Diagnosis not present

## 2023-04-07 ENCOUNTER — Ambulatory Visit (INDEPENDENT_AMBULATORY_CARE_PROVIDER_SITE_OTHER): Payer: Medicaid Other

## 2023-04-07 VITALS — BP 110/72 | HR 78 | Ht 62.0 in | Wt 127.8 lb

## 2023-04-07 DIAGNOSIS — Z3201 Encounter for pregnancy test, result positive: Secondary | ICD-10-CM | POA: Diagnosis not present

## 2023-04-07 DIAGNOSIS — N926 Irregular menstruation, unspecified: Secondary | ICD-10-CM

## 2023-04-07 DIAGNOSIS — Z3480 Encounter for supervision of other normal pregnancy, unspecified trimester: Secondary | ICD-10-CM

## 2023-04-07 DIAGNOSIS — Z3200 Encounter for pregnancy test, result unknown: Secondary | ICD-10-CM

## 2023-04-07 DIAGNOSIS — N912 Amenorrhea, unspecified: Secondary | ICD-10-CM

## 2023-04-07 LAB — POCT URINE PREGNANCY: Preg Test, Ur: POSITIVE — AB

## 2023-04-07 NOTE — Progress Notes (Signed)
    NURSE VISIT NOTE  Subjective:    Patient ID: Lacey Bentley, female    DOB: [birth year], 32 y.o.   MRN: [id number]  HPI  Patient is a 32 y.o. [id number] female who presents for evaluation of amenorrhea. She believes she could be pregnant.  Pregnancy was not initially desired, plans to keep pregnancy.  Sexual Activity: single partner, contraception: none. Current symptoms also include: breast tenderness, fatigue, frequent urination, and positive home pregnancy test. Last period was abnormal.    Objective:    BP 110/72   Pulse 78   Ht 5\' 2"  (1.575 m)   Wt 127 lb 12.8 oz (58 kg)   LMP 01/17/2023 (Approximate)   BMI 23.37 kg/m   Lab Review  No results found for any visits on 04/07/23.  Assessment:   1. Possible pregnancy, not yet confirmed   2. Irregular menstrual cycle   3. Supervision of other normal pregnancy, antepartum     Plan:   Pregnancy Test: Positive  Estimated Date of Delivery: None noted. Encouraged well-balanced diet, plenty of rest when needed, pre-natal vitamins daily and walking for exercise.  Discussed self-help for nausea, avoiding OTC medications until consulting provider or pharmacist, other than Tylenol as needed, minimal caffeine (1-2 cups daily) and avoiding alcohol.   She will schedule her nurse visit @ [redacted] wks pregnant, u/s for dating and labs @10  wk, and NOB visit at [redacted] wk pregnant.    Feel free to call with any questions.    Loman Chroman, CMA

## 2023-04-08 ENCOUNTER — Ambulatory Visit: Payer: Medicaid Other

## 2023-04-09 ENCOUNTER — Ambulatory Visit (INDEPENDENT_AMBULATORY_CARE_PROVIDER_SITE_OTHER): Payer: Medicaid Other

## 2023-04-09 DIAGNOSIS — Z3689 Encounter for other specified antenatal screening: Secondary | ICD-10-CM

## 2023-04-09 DIAGNOSIS — Z3480 Encounter for supervision of other normal pregnancy, unspecified trimester: Secondary | ICD-10-CM | POA: Insufficient documentation

## 2023-04-09 NOTE — Patient Instructions (Signed)
Second Trimester of Pregnancy  The second trimester of pregnancy is from week 13 through week 27. This is months 4 through 6 of pregnancy. The second trimester is often a time when you feel your best. Your body has adjusted to being pregnant, and you begin to feel better physically. During the second trimester: Morning sickness has lessened or stopped completely. You may have more energy. You may have an increase in appetite. The second trimester is also a time when the unborn baby (fetus) is growing rapidly. At the end of the sixth month, the fetus may be up to 12 inches long and weigh about 1 pounds. You will likely begin to feel the baby move (quickening) between 16 and 20 weeks of pregnancy. Body changes during your second trimester Your body continues to go through many changes during your second trimester. The changes vary and generally return to normal after the baby is born. Physical changes Your weight will continue to increase. You will notice your lower abdomen bulging out. You may begin to get stretch marks on your hips, abdomen, and breasts. Your breasts will continue to grow and to become tender. Dark spots or blotches (chloasma or mask of pregnancy) may develop on your face. A dark line from your belly button to the pubic area (linea nigra) may appear. You may have changes in your hair. These can include thickening of your hair, rapid growth, and changes in texture. Some people also have hair loss during or after pregnancy, or hair that feels dry or thin. Health changes You may develop headaches. You may have heartburn. You may develop constipation. You may develop hemorrhoids or swollen, bulging veins (varicose veins). Your gums may bleed and may be sensitive to brushing and flossing. You may urinate more often because the fetus is pressing on your bladder. You may have back pain. This is caused by: Weight gain. Pregnancy hormones that are relaxing the joints in your  pelvis. A shift in weight and the muscles that support your balance. Follow these instructions at home: Medicines Follow your health care provider's instructions regarding medicine use. Specific medicines may be either safe or unsafe to take during pregnancy. Do not take any medicines unless approved by your health care provider. Take a prenatal vitamin that contains at least 600 micrograms (mcg) of folic acid. Eating and drinking Eat a healthy diet that includes fresh fruits and vegetables, whole grains, good sources of protein such as meat, eggs, or tofu, and low-fat dairy products. Avoid raw meat and unpasteurized juice, milk, and cheese. These carry germs that can harm you and your baby. You may need to take these actions to prevent or treat constipation: Drink enough fluid to keep your urine pale yellow. Eat foods that are high in fiber, such as beans, whole grains, and fresh fruits and vegetables. Limit foods that are high in fat and processed sugars, such as fried or sweet foods. Activity Exercise only as directed by your health care provider. Most people can continue their usual exercise routine during pregnancy. Try to exercise for 30 minutes at least 5 days a week. Stop exercising if you develop contractions in your uterus. Stop exercising if you develop pain or cramping in the lower abdomen or lower back. Avoid exercising if it is very hot or humid or if you are at a high altitude. Avoid heavy lifting. If you choose to, you may have sex unless your health care provider tells you not to. Relieving pain and discomfort Wear a supportive   bra to prevent discomfort from breast tenderness. Take warm sitz baths to soothe any pain or discomfort caused by hemorrhoids. Use hemorrhoid cream if your health care provider approves. Rest with your legs raised (elevated) if you have leg cramps or low back pain. If you develop varicose veins: Wear support hose as told by your health care  provider. Elevate your feet for 15 minutes, 3-4 times a day. Limit salt in your diet. Safety Wear your seat belt at all times when driving or riding in a car. Talk with your health care provider if someone is verbally or physically abusive to you. Lifestyle Do not use hot tubs, steam rooms, or saunas. Do not douche. Do not use tampons or scented sanitary pads. Avoid cat litter boxes and soil used by cats. These carry germs that can cause birth defects in the baby and possibly loss of the fetus by miscarriage or stillbirth. Do not use herbal remedies, alcohol, illegal drugs, or medicines that are not approved by your health care provider. Chemicals in these products can harm your baby. Do not use any products that contain nicotine or tobacco, such as cigarettes, e-cigarettes, and chewing tobacco. If you need help quitting, ask your health care provider. General instructions During a routine prenatal visit, your health care provider will do a physical exam and other tests. He or she will also discuss your overall health. Keep all follow-up visits. This is important. Ask your health care provider for a referral to a local prenatal education class. Ask for help if you have counseling or nutritional needs during pregnancy. Your health care provider can offer advice or refer you to specialists for help with various needs. Where to find more information American Pregnancy Association: americanpregnancy.org American College of Obstetricians and Gynecologists: acog.org/en/Womens%20Health/Pregnancy Office on Women's Health: womenshealth.gov/pregnancy Contact a health care provider if you have: A headache that does not go away when you take medicine. Vision changes or you see spots in front of your eyes. Mild pelvic cramps, pelvic pressure, or nagging pain in the abdominal area. Persistent nausea, vomiting, or diarrhea. A bad-smelling vaginal discharge or foul-smelling urine. Pain when you  urinate. Sudden or extreme swelling of your face, hands, ankles, feet, or legs. A fever. Get help right away if you: Have fluid leaking from your vagina. Have spotting or bleeding from your vagina. Have severe abdominal cramping or pain. Have difficulty breathing. Have chest pain. Have fainting spells. Have not felt your baby move for the time period told by your health care provider. Have new or increased pain, swelling, or redness in an arm or leg. Summary The second trimester of pregnancy is from week 13 through week 27 (months 4 through 6). Do not use herbal remedies, alcohol, illegal drugs, or medicines that are not approved by your health care provider. Chemicals in these products can harm your baby. Exercise only as directed by your health care provider. Most people can continue their usual exercise routine during pregnancy. Keep all follow-up visits. This is important. This information is not intended to replace advice given to you by your health care provider. Make sure you discuss any questions you have with your health care provider. Document Revised: 03/08/2020 Document Reviewed: 01/13/2020 Elsevier Patient Education  2024 Elsevier Inc. First Trimester of Pregnancy  The first trimester of pregnancy starts on the first day of your last menstrual period until the end of week 12. This is also called months 1 through 3 of pregnancy. Body changes during your first trimester Your   body goes through many changes during pregnancy. The changes usually return to normal after your baby is born. Physical changes You may gain or lose weight. Your breasts may grow larger and hurt. The area around your nipples may get darker. Dark spots or blotches may develop on your face. You may have changes in your hair. Health changes You may feel like you might vomit (nauseous), and you may vomit. You may have heartburn. You may have headaches. You may have trouble pooping (constipation). Your  gums may bleed. Other changes You may get tired easily. You may pee (urinate) more often. Your menstrual periods will stop. You may not feel hungry. You may want to eat certain kinds of food. You may have changes in your emotions from day to day. You may have more dreams. Follow these instructions at home: Medicines Take over-the-counter and prescription medicines only as told by your doctor. Some medicines are not safe during pregnancy. Take a prenatal vitamin that contains at least 600 micrograms (mcg) of folic acid. Eating and drinking Eat healthy meals that include: Fresh fruits and vegetables. Whole grains. Good sources of protein, such as meat, eggs, or tofu. Low-fat dairy products. Avoid raw meat and unpasteurized juice, milk, and cheese. If you feel like you may vomit, or you vomit: Eat 4 or 5 small meals a day instead of 3 large meals. Try eating a few soda crackers. Drink liquids between meals instead of during meals. You may need to take these actions to prevent or treat trouble pooping: Drink enough fluids to keep your pee (urine) pale yellow. Eat foods that are high in fiber. These include beans, whole grains, and fresh fruits and vegetables. Limit foods that are high in fat and sugar. These include fried or sweet foods. Activity Exercise only as told by your doctor. Most people can do their usual exercise routine during pregnancy. Stop exercising if you have cramps or pain in your lower belly (abdomen) or low back. Do not exercise if it is too hot or too humid, or if you are in a place of great height (high altitude). Avoid heavy lifting. If you choose to, you may have sex unless your doctor tells you not to. Relieving pain and discomfort Wear a good support bra if your breasts are sore. Rest with your legs raised (elevated) if you have leg cramps or low back pain. If you have bulging veins (varicose veins) in your legs: Wear support hose as told by your  doctor. Raise your feet for 15 minutes, 3-4 times a day. Limit salt in your food. Safety Wear your seat belt at all times when you are in a car. Talk with your doctor if someone is hurting you or yelling at you. Talk with your doctor if you are feeling sad or have thoughts of hurting yourself. Lifestyle Do not use hot tubs, steam rooms, or saunas. Do not douche. Do not use tampons or scented sanitary pads. Do not use herbal medicines, illegal drugs, or medicines that are not approved by your doctor. Do not drink alcohol. Do not smoke or use any products that contain nicotine or tobacco. If you need help quitting, ask your doctor. Avoid cat litter boxes and soil that is used by cats. These carry germs that can cause harm to the baby and can cause a loss of your baby by miscarriage or stillbirth. General instructions Keep all follow-up visits. This is important. Ask for help if you need counseling or if you need help with   nutrition. Your doctor can give you advice or tell you where to go for help. Visit your dentist. At home, brush your teeth with a soft toothbrush. Floss gently. Write down your questions. Take them to your prenatal visits. Where to find more information American Pregnancy Association: americanpregnancy.org American College of Obstetricians and Gynecologists: www.acog.org Office on Women's Health: womenshealth.gov/pregnancy Contact a doctor if: You are dizzy. You have a fever. You have mild cramps or pressure in your lower belly. You have a nagging pain in your belly area. You continue to feel like you may vomit, you vomit, or you have watery poop (diarrhea) for 24 hours or longer. You have a bad-smelling fluid coming from your vagina. You have pain when you pee. You are exposed to a disease that spreads from person to person, such as chickenpox, measles, Zika virus, HIV, or hepatitis. Get help right away if: You have spotting or bleeding from your vagina. You have  very bad belly cramping or pain. You have shortness of breath or chest pain. You have any kind of injury, such as from a fall or a car crash. You have new or increased pain, swelling, or redness in an arm or leg. Summary The first trimester of pregnancy starts on the first day of your last menstrual period until the end of week 12 (months 1 through 3). Eat 4 or 5 small meals a day instead of 3 large meals. Do not smoke or use any products that contain nicotine or tobacco. If you need help quitting, ask your doctor. Keep all follow-up visits. This information is not intended to replace advice given to you by your health care provider. Make sure you discuss any questions you have with your health care provider. Document Revised: 03/08/2020 Document Reviewed: 01/13/2020 Elsevier Patient Education  2024 Elsevier Inc. Commonly Asked Questions During Pregnancy  Cats: A parasite can be excreted in cat feces.  To avoid exposure you need to have another person empty the little box.  If you must empty the litter box you will need to wear gloves.  Wash your hands after handling your cat.  This parasite can also be found in raw or undercooked meat so this should also be avoided.  Colds, Sore Throats, Flu: Please check your medication sheet to see what you can take for symptoms.  If your symptoms are unrelieved by these medications please call the office.  Dental Work: Most any dental work your dentist recommends is permitted.  X-rays should only be taken during the first trimester if absolutely necessary.  Your abdomen should be shielded with a lead apron during all x-rays.  Please notify your provider prior to receiving any x-rays.  Novocaine is fine; gas is not recommended.  If your dentist requires a note from us prior to dental work please call the office and we will provide one for you.  Exercise: Exercise is an important part of staying healthy during your pregnancy.  You may continue most exercises  you were accustomed to prior to pregnancy.  Later in your pregnancy you will most likely notice you have difficulty with activities requiring balance like riding a bicycle.  It is important that you listen to your body and avoid activities that put you at a higher risk of falling.  Adequate rest and staying well hydrated are a must!  If you have questions about the safety of specific activities ask your provider.    Exposure to Children with illness: Try to avoid obvious exposure; report any   symptoms to us when noted,  If you have chicken pos, red measles or mumps, you should be immune to these diseases.   Please do not take any vaccines while pregnant unless you have checked with your OB provider.  Fetal Movement: After 28 weeks we recommend you do "kick counts" twice daily.  Lie or sit down in a calm quiet environment and count your baby movements "kicks".  You should feel your baby at least 10 times per hour.  If you have not felt 10 kicks within the first hour get up, walk around and have something sweet to eat or drink then repeat for an additional hour.  If count remains less than 10 per hour notify your provider.  Fumigating: Follow your pest control agent's advice as to how long to stay out of your home.  Ventilate the area well before re-entering.  Hemorrhoids:   Most over-the-counter preparations can be used during pregnancy.  Check your medication to see what is safe to use.  It is important to use a stool softener or fiber in your diet and to drink lots of liquids.  If hemorrhoids seem to be getting worse please call the office.   Hot Tubs:  Hot tubs Jacuzzis and saunas are not recommended while pregnant.  These increase your internal body temperature and should be avoided.  Intercourse:  Sexual intercourse is safe during pregnancy as long as you are comfortable, unless otherwise advised by your provider.  Spotting may occur after intercourse; report any bright red bleeding that is heavier  than spotting.  Labor:  If you know that you are in labor, please go to the hospital.  If you are unsure, please call the office and let us help you decide what to do.  Lifting, straining, etc:  If your job requires heavy lifting or straining please check with your provider for any limitations.  Generally, you should not lift items heavier than that you can lift simply with your hands and arms (no back muscles)  Painting:  Paint fumes do not harm your pregnancy, but may make you ill and should be avoided if possible.  Latex or water based paints have less odor than oils.  Use adequate ventilation while painting.  Permanents & Hair Color:  Chemicals in hair dyes are not recommended as they cause increase hair dryness which can increase hair loss during pregnancy.  " Highlighting" and permanents are allowed.  Dye may be absorbed differently and permanents may not hold as well during pregnancy.  Sunbathing:  Use a sunscreen, as skin burns easily during pregnancy.  Drink plenty of fluids; avoid over heating.  Tanning Beds:  Because their possible side effects are still unknown, tanning beds are not recommended.  Ultrasound Scans:  Routine ultrasounds are performed at approximately 20 weeks.  You will be able to see your baby's general anatomy an if you would like to know the gender this can usually be determined as well.  If it is questionable when you conceived you may also receive an ultrasound early in your pregnancy for dating purposes.  Otherwise ultrasound exams are not routinely performed unless there is a medical necessity.  Although you can request a scan we ask that you pay for it when conducted because insurance does not cover " patient request" scans.  Work: If your pregnancy proceeds without complications you may work until your due date, unless your physician or employer advises otherwise.  Round Ligament Pain/Pelvic Discomfort:  Sharp, shooting pains not associated   with bleeding are  fairly common, usually occurring in the second trimester of pregnancy.  They tend to be worse when standing up or when you remain standing for long periods of time.  These are the result of pressure of certain pelvic ligaments called "round ligaments".  Rest, Tylenol and heat seem to be the most effective relief.  As the womb and fetus grow, they rise out of the pelvis and the discomfort improves.  Please notify the office if your pain seems different than that described.  It may represent a more serious condition.  Common Medications Safe in Pregnancy  Acne:      Constipation:  Benzoyl Peroxide     Colace  Clindamycin      Dulcolax Suppository  Topica Erythromycin     Fibercon  Salicylic Acid      Metamucil         Miralax AVOID:        Senakot   Accutane    Cough:  Retin-A       Cough Drops  Tetracycline      Phenergan w/ Codeine if Rx  Minocycline      Robitussin (Plain & DM)  Antibiotics:     Crabs/Lice:  Ceclor       RID  Cephalosporins    AVOID:  E-Mycins      Kwell  Keflex  Macrobid/Macrodantin   Diarrhea:  Penicillin      Kao-Pectate  Zithromax      Imodium AD         PUSH FLUIDS AVOID:       Cipro     Fever:  Tetracycline      Tylenol (Regular or Extra  Minocycline       Strength)  Levaquin      Extra Strength-Do not          Exceed 8 tabs/24 hrs Caffeine:        <200mg/day (equiv. To 1 cup of coffee or  approx. 3 12 oz sodas)         Gas: Cold/Hayfever:       Gas-X  Benadryl      Mylicon  Claritin       Phazyme  **Claritin-D        Chlor-Trimeton    Headaches:  Dimetapp      ASA-Free Excedrin  Drixoral-Non-Drowsy     Cold Compress  Mucinex (Guaifenasin)     Tylenol (Regular or Extra  Sudafed/Sudafed-12 Hour     Strength)  **Sudafed PE Pseudoephedrine   Tylenol Cold & Sinus     Vicks Vapor Rub  Zyrtec  **AVOID if Problems With Blood Pressure         Heartburn: Avoid lying down for at least 1 hour after meals  Aciphex      Maalox     Rash:  Milk of  Magnesia     Benadryl    Mylanta       1% Hydrocortisone Cream  Pepcid  Pepcid Complete   Sleep Aids:  Prevacid      Ambien   Prilosec       Benadryl  Rolaids       Chamomile Tea  Tums (Limit 4/day)     Unisom         Tylenol PM         Warm milk-add vanilla or  Hemorrhoids:       Sugar for taste  Anusol/Anusol H.C.  (RX: Analapram 2.5%)  Sugar Substitutes:    Hydrocortisone OTC     Ok in moderation  Preparation H      Tucks        Vaseline lotion applied to tissue with wiping    Herpes:     Throat:  Acyclovir      Oragel  Famvir  Valtrex     Vaccines:         Flu Shot Leg Cramps:       *Gardasil  Benadryl      Hepatitis A         Hepatitis B Nasal Spray:       Pneumovax  Saline Nasal Spray     Polio Booster         Tetanus Nausea:       Tuberculosis test or PPD  Vitamin B6 25 mg TID   AVOID:    Dramamine      *Gardasil  Emetrol       Live Poliovirus  Ginger Root 250 mg QID    MMR (measles, mumps &  High Complex Carbs @ Bedtime    rebella)  Sea Bands-Accupressure    Varicella (Chickenpox)  Unisom 1/2 tab TID     *No known complications           If received before Pain:         Known pregnancy;   Darvocet       Resume series after  Lortab        Delivery  Percocet    Yeast:   Tramadol      Femstat  Tylenol 3      Gyne-lotrimin  Ultram       Monistat  Vicodin           MISC:         All Sunscreens           Hair Coloring/highlights          Insect Repellant's          (Including DEET)         Mystic Tans  

## 2023-04-09 NOTE — Progress Notes (Signed)
New OB Intake  I connected with  Lacey Bentley on 04/09/23 at 11:15 AM EDT by telephone and verified that I am speaking with the correct person using two identifiers. Nurse is located at [facility] and pt is located at home.  I explained I am completing New OB Intake today. We discussed her EDD of unknown that is based on LMP of unknown. Pt is [id number]/[id number]. I reviewed her allergies, medications, Medical/Surgical/OB history, and appropriate screenings. There are no cats in the home. Based on history, this is a/an pregnancy uncomplicated .   Patient Active Problem List   Diagnosis Date Noted   Supervision of other normal pregnancy, antepartum 04/09/2023   Acute calculous cholecystitis 07/02/2021   COVID-19 affecting pregnancy, antepartum 11/26/2020   Vaginal bleeding during pregnancy, antepartum 11/24/2020   Twin pregnancy, twins dichorionic and diamniotic 11/24/2020   Nausea and vomiting of pregnancy, antepartum 11/15/2020   Indication for care or intervention related to labor and delivery 11/14/2020   Dichorionic diamniotic twin pregnancy in second trimester 08/29/2020   Grand multipara 08/29/2020   Gastroesophageal reflux in pregnancy 08/29/2020   Abdominal pain 01/15/2018   Choledocholithiasis    Jaundice    Elevated liver enzymes    Encounter for induction of labor 08/09/2017   Late prenatal care 03/15/2017   Nausea and vomiting during pregnancy prior to [redacted] weeks gestation 03/15/2017   History of preterm delivery, currently pregnant in second trimester 03/15/2017   Irregular uterine contractions 07/08/2015    Concerns addressed today Prenatal vitamins increases her anxiety; she and some doctor friends are trying to figure out what she can take separately; adv to be sure she gets 400-861mcg of folic acid.  Delivery Plans:  Plans to deliver at [facility].  Anatomy US Explained first scheduled [place] will be done 7/1st and an anatomy scan will be done at 20  weeks.  Labs Discussed genetic screening with patient. Patient desires genetic testing to be drawn at new OB visit. Discussed possible labs to be drawn at new OB appointment.  COVID Vaccine Patient has not had COVID vaccine.   Social Determinants of Health Food Insecurity: expresses food insecurity. Information given on local food banks. WIC Referral: Patient is interested in referral to [facility]. Pt states she had trouble with WIC about formula; states they wouldn't listen to her that her baby was lactose intolerant. Transportation: Patient expressed transportation needs. Childcare: Discussed no children allowed at ultrasound appointments.   First visit review I reviewed new OB appt with pt. I explained she will have ob bloodwork and pap smear/pelvic exam if indicated. Explained pt will be seen by an AOB provider at first visit; encounter routed to appropriate provider.   Loran Senters, [place] 04/09/2023  12:01 PM

## 2023-04-14 ENCOUNTER — Ambulatory Visit
Admission: RE | Admit: 2023-04-14 | Discharge: 2023-04-14 | Disposition: A | Discharge status: Home / Self Care | Payer: Medicaid Other | Source: Ambulatory Visit | Attending: Obstetrics and Gynecology | Admitting: Obstetrics and Gynecology

## 2023-04-14 ENCOUNTER — Other Ambulatory Visit: Payer: Self-pay | Admitting: Obstetrics and Gynecology

## 2023-04-14 DIAGNOSIS — Z3481 Encounter for supervision of other normal pregnancy, first trimester: Secondary | ICD-10-CM | POA: Diagnosis not present

## 2023-04-14 DIAGNOSIS — Z3689 Encounter for other specified antenatal screening: Secondary | ICD-10-CM | POA: Diagnosis not present

## 2023-04-14 DIAGNOSIS — Z3200 Encounter for pregnancy test, result unknown: Secondary | ICD-10-CM

## 2023-04-14 DIAGNOSIS — Z3480 Encounter for supervision of other normal pregnancy, unspecified trimester: Secondary | ICD-10-CM | POA: Insufficient documentation

## 2023-04-14 DIAGNOSIS — Z3A10 10 weeks gestation of pregnancy: Secondary | ICD-10-CM | POA: Diagnosis not present

## 2023-04-14 DIAGNOSIS — N926 Irregular menstruation, unspecified: Secondary | ICD-10-CM

## 2023-04-22 ENCOUNTER — Telehealth: Payer: Self-pay | Admitting: Obstetrics and Gynecology

## 2023-04-22 NOTE — Telephone Encounter (Signed)
I contacted the patient via phone. The patient needs NOB physical scheduled, she is [redacted]w[redacted]d today. I spoke with the patient about scheduling NOB phy. The patient is having transportation issues and wants to make should her "truck isn't about to die on her before she schedules". The patient states "she will call back once she has reliable transportation".

## 2023-04-22 NOTE — Telephone Encounter (Signed)
-----   Message from Loman Chroman, CMA sent at 04/22/2023  8:21 AM EDT ----- Regarding: NOB physical Patient needs NOB physical scheduled, she is [redacted]w[redacted]d today.

## 2023-05-30 ENCOUNTER — Encounter: Payer: Self-pay | Admitting: Obstetrics

## 2023-05-30 ENCOUNTER — Other Ambulatory Visit (HOSPITAL_COMMUNITY)
Admission: RE | Admit: 2023-05-30 | Discharge: 2023-05-30 | Disposition: A | Discharge status: Home / Self Care | Payer: Medicaid Other | Source: Ambulatory Visit | Attending: Obstetrics | Admitting: Obstetrics

## 2023-05-30 ENCOUNTER — Ambulatory Visit: Payer: Medicaid Other | Admitting: Obstetrics

## 2023-05-30 VITALS — BP 104/68 | HR 77 | Wt 130.0 lb

## 2023-05-30 DIAGNOSIS — Z0184 Encounter for antibody response examination: Secondary | ICD-10-CM | POA: Diagnosis present

## 2023-05-30 DIAGNOSIS — O0990 Supervision of high risk pregnancy, unspecified, unspecified trimester: Secondary | ICD-10-CM

## 2023-05-30 DIAGNOSIS — D649 Anemia, unspecified: Secondary | ICD-10-CM | POA: Diagnosis present

## 2023-05-30 DIAGNOSIS — Z3480 Encounter for supervision of other normal pregnancy, unspecified trimester: Secondary | ICD-10-CM | POA: Diagnosis present

## 2023-05-30 DIAGNOSIS — O0942 Supervision of pregnancy with grand multiparity, second trimester: Secondary | ICD-10-CM

## 2023-05-30 DIAGNOSIS — Z131 Encounter for screening for diabetes mellitus: Secondary | ICD-10-CM | POA: Insufficient documentation

## 2023-05-30 DIAGNOSIS — Z0283 Encounter for blood-alcohol and blood-drug test: Secondary | ICD-10-CM | POA: Insufficient documentation

## 2023-05-30 DIAGNOSIS — Z1379 Encounter for other screening for genetic and chromosomal anomalies: Secondary | ICD-10-CM | POA: Diagnosis present

## 2023-05-30 DIAGNOSIS — Z13220 Encounter for screening for lipoid disorders: Secondary | ICD-10-CM | POA: Insufficient documentation

## 2023-05-30 DIAGNOSIS — O34219 Maternal care for unspecified type scar from previous cesarean delivery: Secondary | ICD-10-CM

## 2023-05-30 DIAGNOSIS — Z113 Encounter for screening for infections with a predominantly sexual mode of transmission: Secondary | ICD-10-CM | POA: Diagnosis present

## 2023-05-30 DIAGNOSIS — T7589XA Other specified effects of external causes, initial encounter: Secondary | ICD-10-CM

## 2023-05-30 DIAGNOSIS — Z3482 Encounter for supervision of other normal pregnancy, second trimester: Secondary | ICD-10-CM | POA: Diagnosis present

## 2023-05-30 DIAGNOSIS — Z3A16 16 weeks gestation of pregnancy: Secondary | ICD-10-CM | POA: Diagnosis present
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
## 2023-05-31 LAB — CBC/D/PLT+RPR+RH+ABO+RUBIGG...
Antibody Screen: NEGATIVE
Basophils Absolute: 0 10*3/uL (ref 0.0–0.2)
Basos: 1 %
EOS (ABSOLUTE): 0.1 10*3/uL (ref 0.0–0.4)
Eos: 1 %
HCV Ab: NONREACTIVE
HIV Screen 4th Generation wRfx: NONREACTIVE
Hematocrit: 33.4 % — ABNORMAL LOW (ref 34.0–46.6)
Hemoglobin: 11.4 g/dL (ref 11.1–15.9)
Hepatitis B Surface Ag: NEGATIVE
Immature Grans (Abs): 0 10*3/uL (ref 0.0–0.1)
Immature Granulocytes: 1 %
Lymphocytes Absolute: 1.6 10*3/uL (ref 0.7–3.1)
Lymphs: 21 %
MCH: 30.9 pg (ref 26.6–33.0)
MCHC: 34.1 g/dL (ref 31.5–35.7)
MCV: 91 fL (ref 79–97)
Monocytes Absolute: 0.4 10*3/uL (ref 0.1–0.9)
Monocytes: 5 %
Neutrophils Absolute: 5.2 10*3/uL (ref 1.4–7.0)
Neutrophils: 71 %
Platelets: 237 10*3/uL (ref 150–450)
RBC: 3.69 x10E6/uL — ABNORMAL LOW (ref 3.77–5.28)
RDW: 14 % (ref 11.7–15.4)
RPR Ser Ql: NONREACTIVE
Rh Factor: POSITIVE
Rubella Antibodies, IGG: 3.33 {index} (ref >0.99)
Varicella zoster IgG: 1180 {index} (ref >165)
WBC: 7.3 10*3/uL (ref 3.4–10.8)

## 2023-05-31 LAB — URINALYSIS, ROUTINE W REFLEX MICROSCOPIC
Bilirubin, UA: NEGATIVE
Glucose, UA: NEGATIVE
Ketones, UA: NEGATIVE
Leukocytes,UA: NEGATIVE
Nitrite, UA: NEGATIVE
Protein,UA: NEGATIVE
RBC, UA: NEGATIVE
Specific Gravity, UA: 1.011 (ref 1.005–1.030)
Urobilinogen, Ur: 0.2 mg/dL (ref 0.2–1.0)
pH, UA: 8 — ABNORMAL HIGH (ref 5.0–7.5)

## 2023-05-31 LAB — TOXOPLASMA ANTIBODIES- IGG AND  IGM
Toxoplasma Antibody- IgM: <3 [AU]/ml (ref 0.0–7.9)
Toxoplasma IgG Ratio: <3 [IU]/mL (ref 0.0–7.1)

## 2023-05-31 LAB — HCV INTERPRETATION

## 2023-06-02 LAB — MONITOR DRUG PROFILE 14(MW)
Amphetamine Scrn, Ur: NEGATIVE ng/mL
BARBITURATE SCREEN URINE: NEGATIVE ng/mL
BENZODIAZEPINE SCREEN, URINE: NEGATIVE ng/mL
Buprenorphine, Urine: NEGATIVE ng/mL
CANNABINOIDS UR QL SCN: NEGATIVE ng/mL
Cocaine (Metab) Scrn, Ur: NEGATIVE ng/mL
Creatinine(Crt), U: 52.8 mg/dL (ref 20.0–300.0)
Fentanyl, Urine: NEGATIVE pg/mL
Meperidine Screen, Urine: NEGATIVE ng/mL
Methadone Screen, Urine: NEGATIVE ng/mL
OXYCODONE+OXYMORPHONE UR QL SCN: NEGATIVE ng/mL
Opiate Scrn, Ur: NEGATIVE ng/mL
Ph of Urine: 8.1 (ref 4.5–8.9)
Phencyclidine Qn, Ur: NEGATIVE ng/mL
Propoxyphene Scrn, Ur: NEGATIVE ng/mL
SPECIFIC GRAVITY: 1.011
Tramadol Screen, Urine: NEGATIVE ng/mL

## 2023-06-02 LAB — CULTURE, OB URINE

## 2023-06-02 LAB — NICOTINE SCREEN, URINE: Cotinine Ql Scrn, Ur: POSITIVE ng/mL — AB

## 2023-06-02 LAB — URINE CYTOLOGY ANCILLARY ONLY
Chlamydia: NEGATIVE
Comment: NEGATIVE
Comment: NORMAL
Neisseria Gonorrhea: NEGATIVE

## 2023-06-02 LAB — URINE CULTURE, OB REFLEX

## 2023-06-05 LAB — MATERNIT 21 PLUS CORE, BLOOD
Fetal Fraction: 6
Result (T21): NEGATIVE
Trisomy 13 (Patau syndrome): NEGATIVE
Trisomy 18 (Edwards syndrome): NEGATIVE
Trisomy 21 (Down syndrome): NEGATIVE

## 2023-06-07 ENCOUNTER — Encounter: Payer: Self-pay | Admitting: Obstetrics

## 2023-06-30 DIAGNOSIS — O99332 Smoking (tobacco) complicating pregnancy, second trimester: Secondary | ICD-10-CM | POA: Insufficient documentation

## 2023-06-30 DIAGNOSIS — Z98891 History of uterine scar from previous surgery: Secondary | ICD-10-CM | POA: Insufficient documentation

## 2023-06-30 DIAGNOSIS — Z8759 Personal history of other complications of pregnancy, childbirth and the puerperium: Secondary | ICD-10-CM | POA: Insufficient documentation

## 2023-07-02 ENCOUNTER — Ambulatory Visit (INDEPENDENT_AMBULATORY_CARE_PROVIDER_SITE_OTHER): Payer: Medicaid Other

## 2023-07-02 VITALS — BP 109/73 | HR 80 | Wt 134.8 lb

## 2023-07-02 DIAGNOSIS — Z0283 Encounter for blood-alcohol and blood-drug test: Secondary | ICD-10-CM

## 2023-07-02 DIAGNOSIS — Z3482 Encounter for supervision of other normal pregnancy, second trimester: Secondary | ICD-10-CM

## 2023-07-02 DIAGNOSIS — Z8759 Personal history of other complications of pregnancy, childbirth and the puerperium: Secondary | ICD-10-CM

## 2023-07-02 DIAGNOSIS — Z3A16 16 weeks gestation of pregnancy: Secondary | ICD-10-CM

## 2023-07-02 DIAGNOSIS — Z13220 Encounter for screening for lipoid disorders: Secondary | ICD-10-CM

## 2023-07-02 DIAGNOSIS — O0990 Supervision of high risk pregnancy, unspecified, unspecified trimester: Secondary | ICD-10-CM

## 2023-07-02 DIAGNOSIS — Z131 Encounter for screening for diabetes mellitus: Secondary | ICD-10-CM

## 2023-07-02 DIAGNOSIS — Z3480 Encounter for supervision of other normal pregnancy, unspecified trimester: Secondary | ICD-10-CM

## 2023-07-02 DIAGNOSIS — Z0184 Encounter for antibody response examination: Secondary | ICD-10-CM

## 2023-07-02 DIAGNOSIS — Z3689 Encounter for other specified antenatal screening: Secondary | ICD-10-CM | POA: Diagnosis not present

## 2023-07-02 DIAGNOSIS — D649 Anemia, unspecified: Secondary | ICD-10-CM

## 2023-07-02 DIAGNOSIS — Z3A21 21 weeks gestation of pregnancy: Secondary | ICD-10-CM

## 2023-07-02 DIAGNOSIS — T7589XA Other specified effects of external causes, initial encounter: Secondary | ICD-10-CM

## 2023-07-02 DIAGNOSIS — Z98891 History of uterine scar from previous surgery: Secondary | ICD-10-CM

## 2023-07-02 DIAGNOSIS — Z1379 Encounter for other screening for genetic and chromosomal anomalies: Secondary | ICD-10-CM

## 2023-07-02 DIAGNOSIS — O99332 Smoking (tobacco) complicating pregnancy, second trimester: Secondary | ICD-10-CM

## 2023-07-02 DIAGNOSIS — O4440 Low lying placenta NOS or without hemorrhage, unspecified trimester: Secondary | ICD-10-CM | POA: Insufficient documentation

## 2023-07-02 DIAGNOSIS — Z113 Encounter for screening for infections with a predominantly sexual mode of transmission: Secondary | ICD-10-CM

## 2023-07-02 LAB — POCT URINALYSIS DIPSTICK
Bilirubin, UA: NEGATIVE
Blood, UA: NEGATIVE
Glucose, UA: NEGATIVE
Ketones, UA: NEGATIVE
Leukocytes, UA: NEGATIVE
Nitrite, UA: NEGATIVE
Protein, UA: NEGATIVE
Spec Grav, UA: 1.02 (ref 1.010–1.025)
Urobilinogen, UA: 0.2 U/dL
pH, UA: 7.5 (ref 5.0–8.0)

## 2023-07-02 NOTE — Assessment & Plan Note (Addendum)
-   Desires repeat CS with hysterectomy. Plan for next ROB with MD to discuss surgical plans.

## 2023-07-02 NOTE — Assessment & Plan Note (Signed)
-   Low-lying placenta noted on anatomy scan today. Follow up ultrasound scheduled for 28 weeks.

## 2023-07-02 NOTE — Assessment & Plan Note (Signed)
Reviewed red flag warning signs anticipatory guidance for upcoming prenatal care.

## 2023-07-02 NOTE — Progress Notes (Signed)
    Return Prenatal Note   Assessment/Plan   Plan  32 y.o. [id number] at [redacted]w[redacted]d presents for follow-up OB visit. Reviewed prenatal record including previous visit note.  Low-lying placenta - Low-lying placenta noted on anatomy scan today. Follow up ultrasound scheduled for 28 weeks.   History of cesarean delivery - Desires repeat CS with hysterectomy. Plan for next ROB with MD to discuss surgical plans.   Supervision of high risk pregnancy, antepartum - Reviewed red flag warning signs anticipatory guidance for upcoming prenatal care.    Orders Placed This Encounter  Procedures   US OB Follow Up    Standing Status:   Future    Standing Expiration Date:   10/01/2023    Order Specific Question:   Reason for exam:    Answer:   Low-lying placenta    Order Specific Question:   Preferred imaging location?    Answer:   Internal   POCT Urinalysis Dipstick   Return in about 4 weeks (around 07/30/2023) for  ROB with MD.   No future appointments.  For next visit:   ROB with MD     Subjective   32 y.o. [id number] at [redacted]w[redacted]d presents for this follow-up prenatal visit.  Patient has no concerns.  Patient reports: Movement: Present Contractions: Not present  Objective   Flow sheet Vitals: Pulse Rate: 80 BP: 109/73 Fundal Height: 21 cm Fetal Heart Rate (bpm): 155 Total weight gain: 17 lb 12.8 oz (8.074 kg)  General Appearance  No acute distress, well appearing, and well nourished Pulmonary   Normal work of breathing Neurologic   Alert and oriented to person, place, and time Psychiatric   Mood and affect within normal limits  Lindalou Hose Dhillon [place], CNM  09/18/249:53 AM

## 2023-07-30 ENCOUNTER — Ambulatory Visit: Payer: Medicaid Other | Admitting: Obstetrics and Gynecology

## 2023-07-30 VITALS — BP 101/64 | HR 100 | Wt 137.5 lb

## 2023-07-30 DIAGNOSIS — Z01818 Encounter for other preprocedural examination: Secondary | ICD-10-CM

## 2023-07-30 DIAGNOSIS — Z8759 Personal history of other complications of pregnancy, childbirth and the puerperium: Secondary | ICD-10-CM | POA: Insufficient documentation

## 2023-07-30 DIAGNOSIS — O34219 Maternal care for unspecified type scar from previous cesarean delivery: Secondary | ICD-10-CM

## 2023-07-30 DIAGNOSIS — O0990 Supervision of high risk pregnancy, unspecified, unspecified trimester: Secondary | ICD-10-CM

## 2023-07-30 DIAGNOSIS — Z3A25 25 weeks gestation of pregnancy: Secondary | ICD-10-CM

## 2023-07-30 DIAGNOSIS — F17210 Nicotine dependence, cigarettes, uncomplicated: Secondary | ICD-10-CM

## 2023-07-30 DIAGNOSIS — O4442 Low lying placenta NOS or without hemorrhage, second trimester: Secondary | ICD-10-CM

## 2023-07-30 DIAGNOSIS — O4440 Low lying placenta NOS or without hemorrhage, unspecified trimester: Secondary | ICD-10-CM

## 2023-07-30 DIAGNOSIS — O99332 Smoking (tobacco) complicating pregnancy, second trimester: Secondary | ICD-10-CM

## 2023-07-30 DIAGNOSIS — Z3009 Encounter for other general counseling and advice on contraception: Secondary | ICD-10-CM | POA: Insufficient documentation

## 2023-07-30 DIAGNOSIS — Z98891 History of uterine scar from previous surgery: Secondary | ICD-10-CM

## 2023-07-30 DIAGNOSIS — K089 Disorder of teeth and supporting structures, unspecified: Secondary | ICD-10-CM | POA: Insufficient documentation

## 2023-07-30 LAB — POCT URINALYSIS DIPSTICK OB
Bilirubin, UA: NEGATIVE
Blood, UA: NEGATIVE
Glucose, UA: NEGATIVE
Ketones, UA: NEGATIVE
Leukocytes, UA: NEGATIVE
Nitrite, UA: NEGATIVE
Spec Grav, UA: 1.01 (ref 1.010–1.025)
Urobilinogen, UA: 0.2 U/dL
pH, UA: 6.5 (ref 5.0–8.0)

## 2023-07-30 NOTE — Progress Notes (Deleted)
Error

## 2023-07-30 NOTE — Progress Notes (Signed)
ROB [redacted]w[redacted]d: She is doing well. She reports good fetal movement. She has no new concerns today.

## 2023-07-30 NOTE — Progress Notes (Signed)
ROB: Patient is a 32 y.o. [id number] at [redacted]w[redacted]d who presents for routine OB care.  Pregnancy is complicated by: Supervision of high risk pregnancy, antepartum; Grand multipara; History of cesarean delivery; History of vacuum extraction assisted delivery; Tobacco smoking affecting pregnancy in second trimester; and Low-lying placenta on their problem list..   Patient has complaints of Braxton Hicks contractions.  Desires to discuss delivery planning.  Patient desires to be scheduled for a C-section.  Previous history of C-section x 1 with her last pregnancy with twins.  Patient notes that she desires permanent sterilization.  Has questions about having a hysterectomy at time of her C-section.  I discussed that patient did not require a C-section and had the option to have a trial of labor with subsequently having a postpartum tubal ligation performed the day after.  However it was her choice whether or not she would choose to attempt a VBAC versus feeding with scheduling for repeat C-section.  I discussed risk and benefits of both.  Patient after discussion notes that she would still like to go ahead and proceed with a repeat C-section.  On discussion of her desires for hysterectomy, patient notes that she does want a method was not 100% permanent and that she was concerned that she is having a tubal ligation would still leave her the small window of possibility of pregnancy.  I did discuss that no contraceptive method was permanent however tubal ligation has a 1% failure rate.  If she were this concerned about the possibility of pregnancy again after having a tubal ligation, she could opt to have her entire tubes removed.  Notes that this would be fine for her.  I advised that hysterectomy was not required or even recommended for sterilization purposes, and that a hysterectomy would be a much more risky procedure if undertaken immediately after this is effective, and should be reserved for emergency cases.  Patient  notes understanding, is fine with this.  Medicaid sterilization form signed today.  Patient would like to go ahead and schedule her C-section. Desires date of 11/07/23. To see Dr. Valentino Saxon again at 38 weeks for pre-op. Will schedule. RTC in 2 weeks for f/u anatomy scan (LLP), and in 4 weeks for ROB visit. For 28 week labs then. Declines flu vaccine this pregnancy.

## 2023-08-25 ENCOUNTER — Ambulatory Visit: Payer: Medicaid Other

## 2023-08-25 DIAGNOSIS — Z3A29 29 weeks gestation of pregnancy: Secondary | ICD-10-CM | POA: Diagnosis not present

## 2023-08-25 DIAGNOSIS — O4440 Low lying placenta NOS or without hemorrhage, unspecified trimester: Secondary | ICD-10-CM | POA: Diagnosis not present

## 2023-08-25 DIAGNOSIS — Z3480 Encounter for supervision of other normal pregnancy, unspecified trimester: Secondary | ICD-10-CM

## 2023-08-27 ENCOUNTER — Other Ambulatory Visit: Payer: Medicaid Other

## 2023-08-27 ENCOUNTER — Encounter: Payer: Medicaid Other | Admitting: Obstetrics and Gynecology

## 2023-09-08 ENCOUNTER — Telehealth: Payer: Self-pay

## 2023-09-15 ENCOUNTER — Telehealth: Payer: Self-pay

## 2023-09-15 NOTE — Telephone Encounter (Signed)
TRIAGE VOICEMAIL: Patient states she spoke with somebody last week about the glucose testing. She has cut sugars completely. Anytime she has sugar, her blood pressure drops and she is wonky the rest of the day. They keep trying to tell me I need to do this. I'm not doing the glucose testing. She is advising she is cancelling the glucose part.

## 2023-09-15 NOTE — Telephone Encounter (Signed)
Spoke with patient. Advised not to cancel lab appointment as there is other lab work needed. Advised there is an option to check her blood sugars several time a day for about two weeks. She can discuss this with the provider at her appointment tomorrow.

## 2023-09-16 ENCOUNTER — Encounter: Payer: Medicaid Other | Admitting: Obstetrics and Gynecology

## 2023-09-16 ENCOUNTER — Other Ambulatory Visit: Payer: Medicaid Other

## 2023-09-16 DIAGNOSIS — O0990 Supervision of high risk pregnancy, unspecified, unspecified trimester: Secondary | ICD-10-CM

## 2023-09-16 DIAGNOSIS — Z98891 History of uterine scar from previous surgery: Secondary | ICD-10-CM

## 2023-09-16 DIAGNOSIS — Z3A32 32 weeks gestation of pregnancy: Secondary | ICD-10-CM

## 2023-09-16 DIAGNOSIS — Z23 Encounter for immunization: Secondary | ICD-10-CM

## 2023-09-18 ENCOUNTER — Encounter: Payer: Self-pay | Admitting: Obstetrics and Gynecology

## 2023-09-18 ENCOUNTER — Ambulatory Visit (INDEPENDENT_AMBULATORY_CARE_PROVIDER_SITE_OTHER): Payer: Medicaid Other | Admitting: Obstetrics and Gynecology

## 2023-09-18 VITALS — BP 109/75 | HR 78 | Wt 145.8 lb

## 2023-09-18 DIAGNOSIS — O0990 Supervision of high risk pregnancy, unspecified, unspecified trimester: Secondary | ICD-10-CM

## 2023-09-18 DIAGNOSIS — Z131 Encounter for screening for diabetes mellitus: Secondary | ICD-10-CM

## 2023-09-18 DIAGNOSIS — Z98891 History of uterine scar from previous surgery: Secondary | ICD-10-CM

## 2023-09-18 DIAGNOSIS — Z3A32 32 weeks gestation of pregnancy: Secondary | ICD-10-CM

## 2023-09-18 DIAGNOSIS — Z130 Encounter for screening for diseases of the blood and blood-forming organs and certain disorders involving the immune mechanism: Secondary | ICD-10-CM

## 2023-09-18 DIAGNOSIS — Z113 Encounter for screening for infections with a predominantly sexual mode of transmission: Secondary | ICD-10-CM

## 2023-09-18 LAB — POCT URINALYSIS DIPSTICK OB
Bilirubin, UA: NEGATIVE
Blood, UA: NEGATIVE
Glucose, UA: NEGATIVE
Ketones, UA: NEGATIVE
Leukocytes, UA: NEGATIVE
Nitrite, UA: NEGATIVE
POC,PROTEIN,UA: NEGATIVE
Spec Grav, UA: 1.01 (ref 1.010–1.025)
Urobilinogen, UA: 0.2 U/dL
pH, UA: 6.5 (ref 5.0–8.0)

## 2023-09-18 NOTE — Progress Notes (Signed)
ROB. She states daily fetal movement and intense round ligament pains. Patient declined GCT, TDAP and RSV vaccine. Signed BTC and Medicaid BTL forms today. She states no questions or concerns at this time.

## 2023-09-18 NOTE — Progress Notes (Signed)
ROB: She reports no problems.  Reports daily fetal movement.  She continues to decline glucose testing.  The risks of declining this have been reviewed with her in detail.  She is refusing.  Hemoglobin A1c ordered.  Consider A1c every month going forward.  Size less than dates-ultrasound as needed in the future. Tubal papers signed today -she and Dr. Valentino Saxon have talked about salpingectomy. Continues to desire repeat cesarean delivery. Baby breech by Leopold's maneuvers today.

## 2023-09-18 NOTE — Addendum Note (Signed)
Addended by: Georgiana Shore R on: 09/18/2023 12:04 PM   Modules accepted: Orders

## 2023-09-19 LAB — CBC
Hematocrit: 30.6 % — ABNORMAL LOW (ref 34.0–46.6)
Hemoglobin: 9.8 g/dL — ABNORMAL LOW (ref 11.1–15.9)
MCH: 28.7 pg (ref 26.6–33.0)
MCHC: 32 g/dL (ref 31.5–35.7)
MCV: 90 fL (ref 79–97)
Platelets: 220 10*3/uL (ref 150–450)
RBC: 3.42 x10E6/uL — ABNORMAL LOW (ref 3.77–5.28)
RDW: 12.3 % (ref 11.7–15.4)
WBC: 7.7 10*3/uL (ref 3.4–10.8)

## 2023-09-19 LAB — HEMOGLOBIN A1C
Est. average glucose Bld gHb Est-mCnc: 108 mg/dL
Hgb A1c MFr Bld: 5.4 % (ref 4.8–5.6)

## 2023-09-19 LAB — RPR: RPR Ser Ql: NONREACTIVE

## 2023-10-02 ENCOUNTER — Other Ambulatory Visit (HOSPITAL_COMMUNITY)
Admission: RE | Admit: 2023-10-02 | Discharge: 2023-10-02 | Disposition: A | Discharge status: Home / Self Care | Payer: Medicaid Other | Source: Ambulatory Visit | Attending: Obstetrics | Admitting: Obstetrics

## 2023-10-02 ENCOUNTER — Encounter: Payer: Self-pay | Admitting: Obstetrics

## 2023-10-02 ENCOUNTER — Ambulatory Visit (INDEPENDENT_AMBULATORY_CARE_PROVIDER_SITE_OTHER): Payer: Medicaid Other | Admitting: Obstetrics

## 2023-10-02 VITALS — BP 118/76 | HR 84 | Wt 141.7 lb

## 2023-10-02 DIAGNOSIS — N898 Other specified noninflammatory disorders of vagina: Secondary | ICD-10-CM

## 2023-10-02 DIAGNOSIS — O26893 Other specified pregnancy related conditions, third trimester: Secondary | ICD-10-CM | POA: Insufficient documentation

## 2023-10-02 DIAGNOSIS — O4440 Low lying placenta NOS or without hemorrhage, unspecified trimester: Secondary | ICD-10-CM

## 2023-10-02 DIAGNOSIS — Z3A34 34 weeks gestation of pregnancy: Secondary | ICD-10-CM

## 2023-10-02 DIAGNOSIS — O4443 Low lying placenta NOS or without hemorrhage, third trimester: Secondary | ICD-10-CM

## 2023-10-02 DIAGNOSIS — O0990 Supervision of high risk pregnancy, unspecified, unspecified trimester: Secondary | ICD-10-CM

## 2023-10-02 NOTE — Progress Notes (Signed)
    Return Prenatal Note   Assessment/Plan   Plan  32 y.o. [id number] at [redacted]w[redacted]d presents for follow-up OB visit. Reviewed prenatal record including previous visit note.  Supervision of high risk pregnancy, antepartum -Reviewed s/s of PTL and when to go to the hospital -Swab collected today for increased discharge -Baby feels cephalic by Leopold's today    No orders of the defined types were placed in this encounter.  Return in about 2 weeks (around 10/16/2023).   Future Appointments  Date Time Provider Department Center  10/31/2023  8:00 AM [facility] None    For next visit:  ROB with GBS    Subjective   Lacey Bentley has been having daily BH ctx and then painful contractions at night. She has also noticed increased discharge and pelvic pressure.   Movement: Present Contractions: Irritability  Objective   Flow sheet Vitals: Pulse Rate: 84 BP: 118/76 Fundal Height: 34 cm Fetal Heart Rate (bpm): 150 Total weight gain: 24 lb 11.2 oz (11.2 kg)  General Appearance  No acute distress, well appearing, and well nourished Pulmonary   Normal work of breathing Neurologic   Alert and oriented to person, place, and time Psychiatric   Mood and affect within normal limits Pelvic: cervix thick, closed, no lesions. Negative pooling. No fluid or discharge from os.  Lacey Bentley, CNM 10/02/23 10:54 AM

## 2023-10-02 NOTE — Assessment & Plan Note (Signed)
-  Reviewed s/s of PTL and when to go to the hospital -Swab collected today for increased discharge -Baby feels cephalic by Leopold's today

## 2023-10-07 LAB — CERVICOVAGINAL ANCILLARY ONLY
Bacterial Vaginitis (gardnerella): NEGATIVE
Candida Glabrata: NEGATIVE
Candida Vaginitis: NEGATIVE
Chlamydia: NEGATIVE
Comment: NEGATIVE
Comment: NEGATIVE
Comment: NEGATIVE
Comment: NEGATIVE
Comment: NEGATIVE
Comment: NORMAL
Neisseria Gonorrhea: NEGATIVE
Trichomonas: NEGATIVE

## 2023-10-10 ENCOUNTER — Encounter: Payer: Self-pay | Admitting: Obstetrics

## 2023-10-15 NOTE — L&D Delivery Note (Signed)
Delivery Summary for Annalysa C. Mogel  Labor Events:   Preterm labor: No data found  Rupture date: No data found  Rupture time: No data found  Rupture type: Intact  Fluid Color: Clear White  Induction: No data found  Augmentation: No data found  Complications: No data found  Cervical ripening: No data found No data found   No data found     Delivery:   Episiotomy: No data found  Lacerations: No data found  Repair suture: No data found  Repair # of packets: No data found  Blood loss (ml): 445   Information for the patient's newborn:  Tirzah, Fross [[id number]]   Delivery 11/07/2023 8:34 AM by  C-Section, Low Transverse Sex:  female Gestational Age: [redacted]w[redacted]d Delivery Clinician:   Living?:         [facility]  One minute Five minutes Ten minutes  Skin color:        Heart rate:        Grimace:        Muscle tone:        Breathing:        Totals: 8  9      Presentation/position:      Resuscitation:   Cord information:    Disposition of cord blood:     Blood gases sent?  Complications:   Placenta: Delivered:       appearance Newborn Measurements: Weight: 7 lb 12.2 oz (3520 g)  Height: 19"  Head circumference:    Chest circumference:    Other providers:    Additional  information: Forceps:   Vacuum:   Breech:   Observed anomalies        See Dr. Oretha Milch operative note for details of C-section delivery.   Hildred Laser, MD [facility] at [facility]

## 2023-10-16 ENCOUNTER — Other Ambulatory Visit (HOSPITAL_COMMUNITY)
Admission: RE | Admit: 2023-10-16 | Discharge: 2023-10-16 | Disposition: A | Discharge status: Home / Self Care | Payer: Medicaid Other | Source: Ambulatory Visit | Attending: Certified Nurse Midwife | Admitting: Certified Nurse Midwife

## 2023-10-16 ENCOUNTER — Ambulatory Visit (INDEPENDENT_AMBULATORY_CARE_PROVIDER_SITE_OTHER): Payer: Medicaid Other | Admitting: Certified Nurse Midwife

## 2023-10-16 VITALS — BP 109/75 | HR 77 | Wt 143.4 lb

## 2023-10-16 DIAGNOSIS — Z3A36 36 weeks gestation of pregnancy: Secondary | ICD-10-CM

## 2023-10-16 DIAGNOSIS — O0990 Supervision of high risk pregnancy, unspecified, unspecified trimester: Secondary | ICD-10-CM | POA: Insufficient documentation

## 2023-10-16 DIAGNOSIS — Z113 Encounter for screening for infections with a predominantly sexual mode of transmission: Secondary | ICD-10-CM | POA: Insufficient documentation

## 2023-10-16 DIAGNOSIS — Z3685 Encounter for antenatal screening for Streptococcus B: Secondary | ICD-10-CM

## 2023-10-16 LAB — CERVICOVAGINAL ANCILLARY ONLY
Chlamydia: NEGATIVE
Comment: NEGATIVE
Comment: NORMAL
Neisseria Gonorrhea: NEGATIVE

## 2023-10-16 NOTE — Assessment & Plan Note (Signed)
 Reviewed kick counts and preterm labor warning signs. Instructed to call office or come to hospital with persistent headache, vision changes, regular contractions, leaking of fluid, decreased fetal movement or vaginal bleeding. Desires rLTCS with permanent sterilization even if labors prior to scheduled C/S date. GBS & GC swabs  collected.

## 2023-10-16 NOTE — Progress Notes (Signed)
109

## 2023-10-16 NOTE — Progress Notes (Signed)
    Return Prenatal Note   Subjective   33 y.o. [name] at [redacted]w[redacted]d presents for this follow-up prenatal visit.  Patient having runs of contractions off & on, not rhythmic or regular, some painful. She desires to have C/S for delivery even with labor prior to scheduled C/S date. Patient reports: Movement: Present Contractions: Irregular  Objective   Flow sheet Vitals: Pulse Rate: 77 BP: 109/75 Fundal Height: 36 cm Fetal Heart Rate (bpm): 140 Presentation: Vertex Dilation: 2 Effacement (%): 30 Station: -3 Total weight gain: 26 lb 6.4 oz (12 kg)  General Appearance  No acute distress, well appearing, and well nourished Pulmonary   Normal work of breathing Neurologic   Alert and oriented to person, place, and time Psychiatric   Mood and affect within normal limits  Assessment/Plan   Plan  33 y.o. [name] at [redacted]w[redacted]d presents for follow-up OB visit. Reviewed prenatal record including previous visit note.  Supervision of high risk pregnancy, antepartum Reviewed kick counts and preterm labor warning signs. Instructed to call office or come to hospital with persistent headache, vision changes, regular contractions, leaking of fluid, decreased fetal movement or vaginal bleeding. Desires rLTCS with permanent sterilization even if labors prior to scheduled C/S date. GBS & GC swabs  collected.      Orders Placed This Encounter  Procedures   Culture, beta strep (group b only)   POC Urinalysis Dipstick OB   Return in 1 week (on 10/23/2023) for ROB.   Future Appointments  Date Time Provider Department Center  10/23/2023  8:35 AM Free, Lauraine PARAS, CNM [facility] None  10/31/2023  8:00 AM [facility] None  11/18/2023  8:55 AM Connell Davies, MD [facility] None    For next visit:  continue with routine prenatal care     Harlene LITTIE Cisco, CNM  10/15/2510:06 PM

## 2023-10-16 NOTE — Patient Instructions (Signed)

## 2023-10-20 LAB — CULTURE, BETA STREP (GROUP B ONLY): Strep Gp B Culture: NEGATIVE

## 2023-10-23 ENCOUNTER — Ambulatory Visit (INDEPENDENT_AMBULATORY_CARE_PROVIDER_SITE_OTHER): Payer: Medicaid Other

## 2023-10-23 VITALS — BP 111/72 | HR 87 | Wt 146.0 lb

## 2023-10-23 DIAGNOSIS — Z3A37 37 weeks gestation of pregnancy: Secondary | ICD-10-CM

## 2023-10-23 DIAGNOSIS — Z98891 History of uterine scar from previous surgery: Secondary | ICD-10-CM

## 2023-10-23 DIAGNOSIS — O0990 Supervision of high risk pregnancy, unspecified, unspecified trimester: Secondary | ICD-10-CM

## 2023-10-23 DIAGNOSIS — O34219 Maternal care for unspecified type scar from previous cesarean delivery: Secondary | ICD-10-CM

## 2023-10-23 NOTE — Assessment & Plan Note (Addendum)
-   Concerned about going into labor and not making it to hospital in time for CS. Has been contracting more painfully over the last few days, mostly at night. SVE 2-3/50/-3.  - Reinforced coming to hospital with regular, painful contractions or if her water breaks.

## 2023-10-23 NOTE — Progress Notes (Signed)
    Return Prenatal Note   Assessment/Plan   Plan  33 y.o. [name] at [redacted]w[redacted]d presents for follow-up OB visit. Reviewed prenatal record including previous visit note.  History of cesarean delivery - Repeat CS scheduled for 1/24.   Supervision of high risk pregnancy, antepartum - Concerned about going into labor and not making it to hospital in time for CS. Has been contracting more painfully over the last few days, mostly at night. SVE 2-3/50/-3).  - Reinforced coming to hospital with regular, painful contractions or if her water breaks.    No orders of the defined types were placed in this encounter.  Return in about 1 week (around 10/30/2023) for ROB.   Future Appointments  Date Time Provider Department Center  10/31/2023  8:00 AM [facility] None  11/18/2023  8:55 AM Connell Davies, MD [facility] None    For next visit:  continue with routine prenatal care     Subjective   33 y.o. [name] at [redacted]w[redacted]d presents for this follow-up prenatal visit.  Patient has been feeling more uncomfortable and having runs of contractions that are often painful.  Patient reports: Movement: Present Contractions: Irritability  Objective   Flow sheet Vitals: Pulse Rate: 87 BP: 111/72 Fundal Height: 37 cm Fetal Heart Rate (bpm): 130 Presentation: Vertex Dilation: 2.5 Effacement (%): 50 Station: -3 Total weight gain: 29 lb (13.2 kg)  General Appearance  No acute distress, well appearing, and well nourished Pulmonary   Normal work of breathing Neurologic   Alert and oriented to person, place, and time Psychiatric   Mood and affect within normal limits  Lauraine PARAS Lacey Bentley, CNM  01/09/258:58 AM

## 2023-10-23 NOTE — Assessment & Plan Note (Signed)
-   Repeat CS scheduled for 1/24.

## 2023-10-27 ENCOUNTER — Other Ambulatory Visit: Payer: Self-pay

## 2023-10-27 ENCOUNTER — Observation Stay
Admission: EM | Admit: 2023-10-27 | Discharge: 2023-10-27 | Disposition: A | Discharge status: Home / Self Care | Payer: Medicaid Other | Attending: Licensed Practical Nurse | Admitting: Licensed Practical Nurse

## 2023-10-27 ENCOUNTER — Encounter: Payer: Self-pay | Admitting: Obstetrics and Gynecology

## 2023-10-27 DIAGNOSIS — O368130 Decreased fetal movements, third trimester, not applicable or unspecified: Secondary | ICD-10-CM | POA: Diagnosis not present

## 2023-10-27 DIAGNOSIS — O99332 Smoking (tobacco) complicating pregnancy, second trimester: Principal | ICD-10-CM

## 2023-10-27 DIAGNOSIS — O368190 Decreased fetal movements, unspecified trimester, not applicable or unspecified: Secondary | ICD-10-CM | POA: Diagnosis present

## 2023-10-27 DIAGNOSIS — Z87891 Personal history of nicotine dependence: Secondary | ICD-10-CM | POA: Diagnosis not present

## 2023-10-27 DIAGNOSIS — O368331 Maternal care for abnormalities of the fetal heart rate or rhythm, third trimester, fetus 1: Secondary | ICD-10-CM | POA: Insufficient documentation

## 2023-10-27 DIAGNOSIS — O368131 Decreased fetal movements, third trimester, fetus 1: Principal | ICD-10-CM | POA: Insufficient documentation

## 2023-10-27 DIAGNOSIS — Z8659 Personal history of other mental and behavioral disorders: Secondary | ICD-10-CM | POA: Insufficient documentation

## 2023-10-27 DIAGNOSIS — Z3A38 38 weeks gestation of pregnancy: Secondary | ICD-10-CM | POA: Insufficient documentation

## 2023-10-27 DIAGNOSIS — O4440 Low lying placenta NOS or without hemorrhage, unspecified trimester: Secondary | ICD-10-CM

## 2023-10-27 NOTE — OB Triage Note (Signed)
 Pt is a 33yo G12P8, 38w 0d. She arrived to the unit with complaints of decreased fetal movement. She stated that she feels baby move but not as much as normal. She denies vaginal bleeding, reports positive fetal movement, and reports irregular contractions that have been on going for the past week. VS stable, monitors applied and assessing.   Initial FHT 115 at 1617.  CNM aware of pt arrival.

## 2023-10-27 NOTE — OB Triage Note (Signed)
Discharge instructions provided to pt. Pt verbalizes understanding. Vaginal bleeding and discharge, contractions, and fetal movement reviewed by RN. Follow-up care reviewed. Pt discharged home with significant other.  
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
## 2023-10-30 ENCOUNTER — Telehealth: Payer: Self-pay | Admitting: Obstetrics

## 2023-10-30 NOTE — Telephone Encounter (Signed)
Lm for patient to call office back to reschedule ROB appt with MS on 10/31/23

## 2023-10-31 ENCOUNTER — Encounter
Admission: RE | Admit: 2023-10-31 | Discharge: 2023-10-31 | Disposition: A | Discharge status: Home / Self Care | Payer: Medicaid Other | Source: Ambulatory Visit | Attending: Obstetrics and Gynecology | Admitting: Obstetrics and Gynecology

## 2023-10-31 ENCOUNTER — Other Ambulatory Visit: Payer: Self-pay | Admitting: Obstetrics and Gynecology

## 2023-10-31 ENCOUNTER — Other Ambulatory Visit: Payer: Self-pay

## 2023-10-31 ENCOUNTER — Encounter: Payer: Medicaid Other | Admitting: Obstetrics

## 2023-10-31 ENCOUNTER — Telehealth: Payer: Self-pay

## 2023-10-31 HISTORY — DX: Gastro-esophageal reflux disease without esophagitis: K21.9

## 2023-10-31 NOTE — Patient Instructions (Addendum)
Your procedure is scheduled on: Jan 24/ 2025 Friday   Arrival Time: Please call Labor and Delivery the day before your scheduled C-Section to find out your arrival time. [phone].  Arrival: If your arrival time is prior to 6:00 am, please enter through the Emergency Room Entrance and you will be directed to Labor and Delivery. If your arrival time is 6:00 am or later, please enter the Medical Mall and follow the greeter's instructions.  REMEMBER: Instructions that are not followed completely may result in serious medical risk, up to and including death; or upon the discretion of your surgeon and anesthesiologist your surgery may need to be rescheduled.  Do not eat food after midnight the night before surgery.  No gum chewing or hard candies.    One week prior to surgery: Stop Anti-inflammatories (NSAIDS) such as Advil, Aleve, Ibuprofen, Motrin, Naproxen, Naprosyn and Aspirin based products such as Excedrin, Goody's Powder, BC Powder. Stop ANY OVER THE COUNTER supplements until after surgery.  You may however, continue to take Tylenol if needed for pain up until the day of surgery.  Continue taking all of your other prescription medications up until the day of surgery.  ON THE DAY OF SURGERY ONLY TAKE THESE MEDICATIONS WITH SIPS OF WATER:  Prenatal Vit-Fe Fumarate-FA (MULTIVITAMIN-PRENATAL)    No Alcohol for 24 hours before or after surgery.  No Smoking including e-cigarettes for 24 hours prior to surgery.  No chewable tobacco products for at least 6 hours prior to surgery.  No nicotine patches on the day of surgery.  Do not use any "recreational" drugs for at least a week prior to your surgery.  Please be advised that the combination of cocaine and anesthesia may have negative outcomes, up to and including death. If you test positive for cocaine, your surgery will be cancelled.  On the morning of surgery brush your teeth with toothpaste and water, you may rinse your mouth  with mouthwash if you wish. Do not swallow any toothpaste or mouthwash.  Use CHG wipes as directed on instruction sheet.  Do not wear jewelry, make-up, hairpins, clips or nail polish.  For welded (permanent) jewelry: bracelets, anklets, waist bands, etc.  Please have this removed prior to surgery.  If it is not removed, there is a chance that hospital personnel will need to cut it off on the day of surgery.  Do not wear lotions, powders, or perfumes.   Do not shave body hair from the neck down 48 hours before surgery.  Contact lenses, hearing aids and dentures may not be worn into surgery.  Do not bring valuables to the hospital. [facility] is not responsible for any missing/lost belongings or valuables.   Notify your doctor if there is any change in your medical condition (cold, fever, infection).  Wear comfortable clothing (specific to your surgery type) to the hospital.  After surgery, you can help prevent lung complications by doing breathing exercises.  Take deep breaths and cough every 1-2 hours. Your doctor may order a device called an Incentive Spirometer to help you take deep breaths. When coughing or sneezing, hold a pillow firmly against your incision with both hands. This is called "splinting." Doing this helps protect your incision. It also decreases belly discomfort.  Please call the Pre-admissions Testing Dept. at [phone] if you have any questions about these instructions.  Surgery Visitation Policy:  Visitor Passes   All visitors, including children, need an identification sticker when visiting. These stickers must be worn where  they can be seen.   Labor & Delivery  Laboring women may have one designated support person and two other visitors of any age visit. The support person must remain the same. The visitors may switch with other visitors. Visitation is permitted 24 hours per day. The designated support person or a visitor over the age of 16 may  sleep overnight in the patient's room. A doula registered with [facility] for labor and delivery support is not considered a visitor. Doulas not registered with  are considered visitors.  Mother Baby Unit, OB Specialty and Gynecological Care  A designated support person and three visitors of any age may visit. The three visitors may switch out. The designated support person or a visitor age 27 or older may stay overnight in the room. During the postpartum period (up to 6 weeks), if the mother is the patient, she can have her newborn stay with her if there is another support person present who can be responsible for the baby.   Temporary Visitor Restrictions Due to increasing cases of flu, RSV and COVID-19: Children ages 66 and under will not be able to visit patients in [facility] hospitals under most circumstances.   Preparing the Skin Before Surgery     To help prevent the risk of infection at your surgical site, we are now providing you with rinse-free Sage 2% Chlorhexidine Gluconate (CHG) disposable wipes.  Chlorhexidine Gluconate (CHG) Soap  o An antiseptic cleaner that kills germs and bonds with the skin to continue killing germs even after washing  o Used for showering the night before surgery and morning of surgery  The night before surgery: Shower or bathe with warm water. Do not apply perfume, lotions, powders. Wait one hour after shower. Skin should be dry and cool. Open Sage wipe package - use 6 disposable cloths. Wipe body using one cloth for the right arm, one cloth for the left arm, one cloth for the right leg, one cloth for the left leg, one cloth for the chest/abdomen area, and one cloth for the back. Do not use on open wounds or sores. Do not use on face or genitals (private parts). If you are breast feeding, do not use on breasts. 5. Do not rinse, allow to dry. 6. Skin may feel "tacky" for several minutes. 7. Dress in clean clothes. 8. Place  clean sheets on your bed and do not sleep with pets.  REPEAT ABOVE ON THE MORNING OF SURGERY BEFORE ARRIVING TO THE HOSPITAL.

## 2023-10-31 NOTE — Telephone Encounter (Signed)
Pt called triage stating that she can not reschedule her appointment, she has other kids and they have appointments. This is not her only child, she has arranged her other children appointment around hers. I told her we recommend her appointment for her BP and FHT check. Pt states she has her c-section already set up and she can't make another appointment. Pt aware we can not make her come to an appointment.

## 2023-11-06 ENCOUNTER — Inpatient Hospital Stay
Admission: RE | Admit: 2023-11-06 | Discharge: 2023-11-06 | Disposition: A | Discharge status: Home / Self Care | Payer: Medicaid Other | Source: Ambulatory Visit

## 2023-11-06 NOTE — Progress Notes (Signed)
No show for Pre-admission testing labs; surgery tomorrow. Call to patient; stated that she was unable to come today due to weather. Labor and delivery unit notified. Patient to arrive at 5 am tomorrow; patient aware.

## 2023-11-07 ENCOUNTER — Inpatient Hospital Stay: Payer: Medicaid Other | Admitting: Urgent Care

## 2023-11-07 ENCOUNTER — Inpatient Hospital Stay
Admission: RE | Admit: 2023-11-07 | Discharge: 2023-11-09 | DRG: 785 | Disposition: A | Discharge status: Home / Self Care | Payer: Medicaid Other | Attending: Certified Nurse Midwife | Admitting: Certified Nurse Midwife

## 2023-11-07 ENCOUNTER — Other Ambulatory Visit: Payer: Self-pay

## 2023-11-07 ENCOUNTER — Encounter: Payer: Self-pay | Admitting: Obstetrics and Gynecology

## 2023-11-07 ENCOUNTER — Encounter
Admission: RE | Disposition: A | Discharge status: Home / Self Care | Payer: Self-pay | Source: Home / Self Care | Attending: Obstetrics and Gynecology

## 2023-11-07 DIAGNOSIS — Z641 Problems related to multiparity: Secondary | ICD-10-CM

## 2023-11-07 DIAGNOSIS — F17210 Nicotine dependence, cigarettes, uncomplicated: Secondary | ICD-10-CM | POA: Diagnosis present

## 2023-11-07 DIAGNOSIS — O34219 Maternal care for unspecified type scar from previous cesarean delivery: Secondary | ICD-10-CM | POA: Diagnosis present

## 2023-11-07 DIAGNOSIS — Z3A39 39 weeks gestation of pregnancy: Secondary | ICD-10-CM | POA: Diagnosis not present

## 2023-11-07 DIAGNOSIS — Z886 Allergy status to analgesic agent status: Secondary | ICD-10-CM | POA: Diagnosis not present

## 2023-11-07 DIAGNOSIS — K219 Gastro-esophageal reflux disease without esophagitis: Secondary | ICD-10-CM | POA: Diagnosis present

## 2023-11-07 DIAGNOSIS — O9962 Diseases of the digestive system complicating childbirth: Secondary | ICD-10-CM | POA: Diagnosis present

## 2023-11-07 DIAGNOSIS — Z302 Encounter for sterilization: Secondary | ICD-10-CM

## 2023-11-07 DIAGNOSIS — Z01818 Encounter for other preprocedural examination: Secondary | ICD-10-CM

## 2023-11-07 DIAGNOSIS — Z8616 Personal history of COVID-19: Secondary | ICD-10-CM

## 2023-11-07 DIAGNOSIS — O0990 Supervision of high risk pregnancy, unspecified, unspecified trimester: Secondary | ICD-10-CM

## 2023-11-07 DIAGNOSIS — D509 Iron deficiency anemia, unspecified: Secondary | ICD-10-CM | POA: Diagnosis present

## 2023-11-07 DIAGNOSIS — O34211 Maternal care for low transverse scar from previous cesarean delivery: Secondary | ICD-10-CM | POA: Diagnosis present

## 2023-11-07 DIAGNOSIS — O99332 Smoking (tobacco) complicating pregnancy, second trimester: Principal | ICD-10-CM

## 2023-11-07 DIAGNOSIS — O4440 Low lying placenta NOS or without hemorrhage, unspecified trimester: Secondary | ICD-10-CM

## 2023-11-07 DIAGNOSIS — Z3009 Encounter for other general counseling and advice on contraception: Secondary | ICD-10-CM | POA: Diagnosis present

## 2023-11-07 DIAGNOSIS — O0943 Supervision of pregnancy with grand multiparity, third trimester: Secondary | ICD-10-CM | POA: Diagnosis not present

## 2023-11-07 DIAGNOSIS — O9902 Anemia complicating childbirth: Secondary | ICD-10-CM | POA: Diagnosis present

## 2023-11-07 LAB — TYPE AND SCREEN
ABO/RH(D): AB POS
Antibody Screen: NEGATIVE

## 2023-11-07 LAB — CBC
HCT: 29 % — ABNORMAL LOW (ref 36.0–46.0)
Hemoglobin: 9.2 g/dL — ABNORMAL LOW (ref 12.0–15.0)
MCH: 26.3 pg (ref 26.0–34.0)
MCHC: 31.7 g/dL (ref 30.0–36.0)
MCV: 82.9 fL (ref 80.0–100.0)
Platelets: 226 10*3/uL (ref 150–400)
RBC: 3.5 MIL/uL — ABNORMAL LOW (ref 3.87–5.11)
RDW: 14.5 % (ref 11.5–15.5)
WBC: 6.4 10*3/uL (ref 4.0–10.5)
nRBC: 0 % (ref 0.0–0.2)

## 2023-11-07 LAB — RPR: RPR Ser Ql: NONREACTIVE

## 2023-11-07 SURGERY — Surgical Case
Anesthesia: Spinal

## 2023-11-07 MED ORDER — SIMETHICONE 80 MG PO CHEW: 80.0000 mg | CHEWABLE_TABLET | ORAL | Status: DC | PRN

## 2023-11-07 MED ORDER — SIMETHICONE 80 MG PO CHEW
80.0000 mg | CHEWABLE_TABLET | Freq: Three times a day (TID) | ORAL | Status: DC
  Administered 2023-11-07 – 2023-11-09 (×5): 80 mg via ORAL
  Filled 2023-11-07 (×7): qty 1

## 2023-11-07 MED ORDER — LIDOCAINE 5 % EX PTCH
1.0000 | MEDICATED_PATCH | CUTANEOUS | Status: DC
  Administered 2023-11-08 – 2023-11-09 (×2): 1 via TRANSDERMAL
  Filled 2023-11-07 (×2): qty 1

## 2023-11-07 MED ORDER — OXYTOCIN-SODIUM CHLORIDE 30-0.9 UT/500ML-% IV SOLN
2.5000 [IU]/h | INTRAVENOUS | Status: AC
  Administered 2023-11-07: 2.5 [IU]/h via INTRAVENOUS

## 2023-11-07 MED ORDER — PHENYLEPHRINE HCL-NACL 20-0.9 MG/250ML-% IV SOLN
INTRAVENOUS | Status: DC | PRN
  Administered 2023-11-07: 25 ug/min via INTRAVENOUS

## 2023-11-07 MED ORDER — SODIUM CHLORIDE 0.9 % IV SOLN
INTRAVENOUS | Status: AC
  Filled 2023-11-07: qty 5

## 2023-11-07 MED ORDER — ONDANSETRON HCL 4 MG/2ML IJ SOLN
4.0000 mg | Freq: Three times a day (TID) | INTRAMUSCULAR | Status: DC | PRN
  Administered 2023-11-07: 4 mg via INTRAVENOUS
  Filled 2023-11-07 (×2): qty 2

## 2023-11-07 MED ORDER — CHLORHEXIDINE GLUCONATE 0.12 % MT SOLN
15.0000 mL | Freq: Once | OROMUCOSAL | Status: AC
  Administered 2023-11-07: 15 mL via OROMUCOSAL
  Filled 2023-11-07: qty 15

## 2023-11-07 MED ORDER — SOD CITRATE-CITRIC ACID 500-334 MG/5ML PO SOLN
ORAL | Status: AC
  Administered 2023-11-07: 30 mL via ORAL
  Filled 2023-11-07: qty 15

## 2023-11-07 MED ORDER — ACETAMINOPHEN 500 MG PO TABS
1000.0000 mg | ORAL_TABLET | Freq: Four times a day (QID) | ORAL | Status: DC
  Administered 2023-11-07 – 2023-11-09 (×6): 1000 mg via ORAL
  Filled 2023-11-07 (×6): qty 2

## 2023-11-07 MED ORDER — ONDANSETRON HCL 4 MG/2ML IJ SOLN
INTRAMUSCULAR | Status: DC | PRN
  Administered 2023-11-07: 4 mg via INTRAVENOUS

## 2023-11-07 MED ORDER — NALOXONE HCL 4 MG/10ML IJ SOLN: 1.0000 ug/kg/h | INTRAVENOUS | Status: DC | PRN

## 2023-11-07 MED ORDER — DIPHENHYDRAMINE HCL 50 MG/ML IJ SOLN: 12.5000 mg | INTRAMUSCULAR | Status: DC | PRN

## 2023-11-07 MED ORDER — COCONUT OIL OIL
1.0000 | TOPICAL_OIL | Status: DC | PRN
  Filled 2023-11-07: qty 15

## 2023-11-07 MED ORDER — PHENYLEPHRINE HCL-NACL 20-0.9 MG/250ML-% IV SOLN
INTRAVENOUS | Status: AC
  Filled 2023-11-07: qty 250

## 2023-11-07 MED ORDER — PRENATAL MULTIVITAMIN CH
1.0000 | ORAL_TABLET | Freq: Every day | ORAL | Status: DC
  Administered 2023-11-08 – 2023-11-09 (×2): 1 via ORAL
  Filled 2023-11-07 (×3): qty 1

## 2023-11-07 MED ORDER — LACTATED RINGERS IV SOLN: INTRAVENOUS | Status: DC

## 2023-11-07 MED ORDER — ONDANSETRON HCL 4 MG/2ML IJ SOLN
INTRAMUSCULAR | Status: AC
  Filled 2023-11-07: qty 2

## 2023-11-07 MED ORDER — PROMETHAZINE HCL 6.25 MG/5ML PO SOLN: 6.2500 mg | Freq: Four times a day (QID) | ORAL | Status: DC | PRN

## 2023-11-07 MED ORDER — GABAPENTIN 300 MG PO CAPS
300.0000 mg | ORAL_CAPSULE | ORAL | Status: AC
  Administered 2023-11-07: 300 mg via ORAL
  Filled 2023-11-07: qty 1

## 2023-11-07 MED ORDER — LIDOCAINE 5 % EX PTCH
MEDICATED_PATCH | CUTANEOUS | Status: AC
  Filled 2023-11-07: qty 1

## 2023-11-07 MED ORDER — LIDOCAINE 5 % EX PTCH
MEDICATED_PATCH | CUTANEOUS | Status: DC | PRN
  Administered 2023-11-07: 1 via TRANSDERMAL

## 2023-11-07 MED ORDER — SOD CITRATE-CITRIC ACID 500-334 MG/5ML PO SOLN: 30.0000 mL | ORAL | Status: AC

## 2023-11-07 MED ORDER — NALOXONE HCL 0.4 MG/ML IJ SOLN: 0.4000 mg | INTRAMUSCULAR | Status: DC | PRN

## 2023-11-07 MED ORDER — CEFAZOLIN SODIUM-DEXTROSE 2-4 GM/100ML-% IV SOLN
2.0000 g | INTRAVENOUS | Status: AC
  Administered 2023-11-07: 2 g via INTRAVENOUS

## 2023-11-07 MED ORDER — OXYCODONE-ACETAMINOPHEN 5-325 MG PO TABS: 1.0000 | ORAL_TABLET | ORAL | Status: DC | PRN

## 2023-11-07 MED ORDER — MEPERIDINE HCL 25 MG/ML IJ SOLN: 6.2500 mg | INTRAMUSCULAR | Status: DC | PRN

## 2023-11-07 MED ORDER — SODIUM CHLORIDE 0.9% FLUSH
3.0000 mL | INTRAVENOUS | Status: DC | PRN
  Administered 2023-11-08 (×2): 3 mL via INTRAVENOUS

## 2023-11-07 MED ORDER — ORAL CARE MOUTH RINSE
15.0000 mL | Freq: Once | OROMUCOSAL | Status: AC
Start: 2023-11-07 — End: 2023-11-07

## 2023-11-07 MED ORDER — ACETAMINOPHEN 500 MG PO TABS
1000.0000 mg | ORAL_TABLET | ORAL | Status: AC
  Administered 2023-11-07: 1000 mg via ORAL
  Filled 2023-11-07: qty 2

## 2023-11-07 MED ORDER — ACETAMINOPHEN 500 MG PO TABS
1000.0000 mg | ORAL_TABLET | Freq: Four times a day (QID) | ORAL | Status: DC
  Administered 2023-11-07: 1000 mg via ORAL
  Filled 2023-11-07: qty 2

## 2023-11-07 MED ORDER — BUPIVACAINE IN DEXTROSE 0.75-8.25 % IT SOLN
INTRATHECAL | Status: DC | PRN
  Administered 2023-11-07: 1.6 mL via INTRATHECAL

## 2023-11-07 MED ORDER — FENTANYL CITRATE (PF) 100 MCG/2ML IJ SOLN
INTRAMUSCULAR | Status: AC
  Filled 2023-11-07: qty 2

## 2023-11-07 MED ORDER — OXYCODONE HCL 5 MG PO TABS
5.0000 mg | ORAL_TABLET | ORAL | Status: AC | PRN
  Administered 2023-11-07: 5 mg via ORAL
  Filled 2023-11-07 (×2): qty 1

## 2023-11-07 MED ORDER — PHENYLEPHRINE 80 MCG/ML (10ML) SYRINGE FOR IV PUSH (FOR BLOOD PRESSURE SUPPORT)
PREFILLED_SYRINGE | INTRAVENOUS | Status: DC | PRN
  Administered 2023-11-07 (×2): 80 ug via INTRAVENOUS

## 2023-11-07 MED ORDER — SCOPOLAMINE 1 MG/3DAYS TD PT72
1.0000 | MEDICATED_PATCH | Freq: Once | TRANSDERMAL | Status: DC
  Administered 2023-11-07: 1.5 mg via TRANSDERMAL
  Filled 2023-11-07: qty 1

## 2023-11-07 MED ORDER — MENTHOL 3 MG MT LOZG: 1.0000 | LOZENGE | OROMUCOSAL | Status: DC | PRN

## 2023-11-07 MED ORDER — DIPHENHYDRAMINE HCL 25 MG PO CAPS: 25.0000 mg | ORAL_CAPSULE | ORAL | Status: DC | PRN

## 2023-11-07 MED ORDER — SOD CITRATE-CITRIC ACID 500-334 MG/5ML PO SOLN
ORAL | Status: AC
  Administered 2023-11-07: 30 mL
  Filled 2023-11-07: qty 15

## 2023-11-07 MED ORDER — CEFAZOLIN SODIUM-DEXTROSE 2-4 GM/100ML-% IV SOLN
INTRAVENOUS | Status: AC
  Filled 2023-11-07: qty 100

## 2023-11-07 MED ORDER — POVIDONE-IODINE 10 % EX SWAB
2.0000 | Freq: Once | CUTANEOUS | Status: AC
  Administered 2023-11-07: 2 via TOPICAL

## 2023-11-07 MED ORDER — DIBUCAINE (PERIANAL) 1 % EX OINT: 1.0000 | TOPICAL_OINTMENT | CUTANEOUS | Status: DC | PRN

## 2023-11-07 MED ORDER — ZOLPIDEM TARTRATE 5 MG PO TABS: 5.0000 mg | ORAL_TABLET | Freq: Every evening | ORAL | Status: DC | PRN

## 2023-11-07 MED ORDER — FENTANYL CITRATE (PF) 100 MCG/2ML IJ SOLN
INTRAMUSCULAR | Status: DC | PRN
  Administered 2023-11-07: 15 ug via INTRATHECAL

## 2023-11-07 MED ORDER — MORPHINE SULFATE (PF) 0.5 MG/ML IJ SOLN
INTRAMUSCULAR | Status: DC | PRN
  Administered 2023-11-07: .1 ug via INTRATHECAL

## 2023-11-07 MED ORDER — OXYTOCIN-SODIUM CHLORIDE 30-0.9 UT/500ML-% IV SOLN
INTRAVENOUS | Status: DC | PRN
  Administered 2023-11-07 (×2): 200 mL via INTRAVENOUS

## 2023-11-07 MED ORDER — SENNOSIDES-DOCUSATE SODIUM 8.6-50 MG PO TABS
2.0000 | ORAL_TABLET | Freq: Every day | ORAL | Status: DC
Start: 2023-11-08 — End: 2023-11-09
  Administered 2023-11-08: 2 via ORAL
  Filled 2023-11-07 (×2): qty 2

## 2023-11-07 MED ORDER — IBUPROFEN 600 MG PO TABS
600.0000 mg | ORAL_TABLET | Freq: Four times a day (QID) | ORAL | Status: DC
Start: 2023-11-08 — End: 2023-11-09
  Administered 2023-11-08 – 2023-11-09 (×3): 600 mg via ORAL
  Filled 2023-11-07 (×4): qty 1

## 2023-11-07 MED ORDER — WITCH HAZEL-GLYCERIN EX PADS: 1.0000 | MEDICATED_PAD | CUTANEOUS | Status: DC | PRN

## 2023-11-07 MED ORDER — OXYTOCIN-SODIUM CHLORIDE 30-0.9 UT/500ML-% IV SOLN
INTRAVENOUS | Status: AC
  Filled 2023-11-07: qty 500

## 2023-11-07 MED ORDER — MORPHINE SULFATE (PF) 0.5 MG/ML IJ SOLN
INTRAMUSCULAR | Status: AC
  Filled 2023-11-07: qty 10

## 2023-11-07 MED ORDER — KETOROLAC TROMETHAMINE 30 MG/ML IJ SOLN
30.0000 mg | Freq: Four times a day (QID) | INTRAMUSCULAR | Status: AC
  Administered 2023-11-07 – 2023-11-08 (×3): 30 mg via INTRAVENOUS
  Filled 2023-11-07 (×4): qty 1

## 2023-11-07 MED ORDER — DIPHENHYDRAMINE HCL 25 MG PO CAPS: 25.0000 mg | ORAL_CAPSULE | Freq: Four times a day (QID) | ORAL | Status: DC | PRN

## 2023-11-07 SURGICAL SUPPLY — 29 items
BAG COUNTER SPONGE SURGICOUNT (BAG) ×1 IMPLANT
BENZOIN TINCTURE PRP APPL 2/3 (GAUZE/BANDAGES/DRESSINGS) IMPLANT
BNDG TENSOPLAST 6X5 (GAUZE/BANDAGES/DRESSINGS) IMPLANT
CHLORAPREP W/TINT 26 (MISCELLANEOUS) ×2 IMPLANT
CLOSURE STERI STRIP 1/2 X4 (GAUZE/BANDAGES/DRESSINGS) IMPLANT
DERMABOND ADVANCED .7 DNX12 (GAUZE/BANDAGES/DRESSINGS) IMPLANT
DRSG TELFA 3X8 NADH STRL (GAUZE/BANDAGES/DRESSINGS) ×1 IMPLANT
ELECT REM PT RETURN 9FT ADLT (ELECTROSURGICAL) ×1
ELECTRODE REM PT RTRN 9FT ADLT (ELECTROSURGICAL) ×1 IMPLANT
GAUZE SPONGE 4X4 12PLY STRL (GAUZE/BANDAGES/DRESSINGS) ×1 IMPLANT
GLOVE BIO SURGEON STRL SZ 6.5 (GLOVE) ×1 IMPLANT
GLOVE INDICATOR 7.0 STRL GRN (GLOVE) ×1 IMPLANT
GOWN STRL REUS W/ TWL LRG LVL3 (GOWN DISPOSABLE) ×2 IMPLANT
KIT TURNOVER KIT A (KITS) ×1 IMPLANT
MANIFOLD NEPTUNE II (INSTRUMENTS) ×1 IMPLANT
MAT PREVALON FULL STRYKER (MISCELLANEOUS) ×1 IMPLANT
NS IRRIG 1000ML POUR BTL (IV SOLUTION) ×1 IMPLANT
PACK C SECTION AR (MISCELLANEOUS) ×1 IMPLANT
PAD OB MATERNITY 11 LF (PERSONAL CARE ITEMS) ×1 IMPLANT
PAD PREP OB/GYN DISP 24X41 (PERSONAL CARE ITEMS) ×1 IMPLANT
RETRACTOR TRAXI PANNICULUS (MISCELLANEOUS) IMPLANT
SCRUB CHG 4% DYNA-HEX 4OZ (MISCELLANEOUS) ×1 IMPLANT
STRIP CLOSURE SKIN 1/2X4 (GAUZE/BANDAGES/DRESSINGS) IMPLANT
SUT MNCRL AB 4-0 PS2 18 (SUTURE) ×1 IMPLANT
SUT VIC AB 0 CT1 36 (SUTURE) ×4 IMPLANT
SUT VIC AB 3-0 SH 27X BRD (SUTURE) ×1 IMPLANT
TAPE PAPER 3X10 WHT MICROPORE (GAUZE/BANDAGES/DRESSINGS) IMPLANT
TRAP FLUID SMOKE EVACUATOR (MISCELLANEOUS) ×1 IMPLANT
WATER STERILE IRR 500ML POUR (IV SOLUTION) ×1 IMPLANT

## 2023-11-07 NOTE — Op Note (Signed)
Cesarean Section Procedure Note  Indications: previous uterine incision (prior C-section x 1, desiring repeat)  Pre-operative Diagnosis: 39 week 1 day pregnancy, grand multiparity, history of previous C-section x 1 desiring repeat, history of preterm birth, undesired fertility, anemia of pregnancy.  Post-operative Diagnosis: Same  Surgeon: Hildred Laser, MD  Assistants:  Raeford Razor, CNM  Procedure: Repeat low transverse Cesarean Section with bilateral tubal ligation  Anesthesia: Spinal anesthesia  Findings: Female infant, cephalic presentation, 3520 grams, with Apgar scores of 8 at one minute and 9 at five minutes. Intact placenta with 3 vessel cord.  Clear amniotic fluid at amniotomy Nuchal cord x 1 noted at delivery, reducible The uterine outline, tubes and ovaries appeared normal.   Procedure Details: The patient was seen in the Holding Room. The risks, benefits, complications, treatment options, and expected outcomes were discussed with the patient.  The patient concurred with the proposed plan, giving informed consent.  The site of surgery properly noted/marked. The patient was taken to the Operating Room, identified as Lacey Bentley and the procedure verified as C-Section Delivery. A Time Out was held and the above information confirmed.  After induction of anesthesia, the patient was draped and prepped in the usual sterile manner. Anesthesia was tested and noted to be adequate. A Pfannenstiel incision was made and carried down through the subcutaneous tissue to the fascia. Fascial incision was made and extended transversely. The fascia was separated from the underlying rectus tissue superiorly and inferiorly. The peritoneum was identified and entered. Peritoneal incision was extended longitudinally. The surgical assist was able to provide retraction to allow for clear visualization of surgical site. An Alexis retractor was placed in the abdomen for additional retraction. The  utero-vesical peritoneal reflection was incised transversely and the bladder flap was bluntly freed from the lower uterine segment. A low transverse uterine incision was made. Delivered from cephalic presentation was a 3520 gram Female with Apgar scores of 8 at one minute and 9 at five minutes.  The assistant was able to apply adequate fundal pressure to allow for successful delivery of the fetus.  A loose nuchal cord x 1 was reduced after delivery of the fetal head. After the umbilical cord was clamped and cut, no cord blood was obtained for evaluation (not indicated). Delayed cord clamping was observed. The placenta was removed intact and appeared normal. The uterus was exteriorized and cleared of all clots and debris. The uterine outline, tubes and ovaries appeared normal.  The uterine incision was closed with running locked sutures of 0-Vicryl.  A second suture of 0-Vicryl was used in an imbricating layer.  Hemostasis was observed.  Attention was then turned to the fallopian tubes, and where the patient's right fallopian tube was identified and grasped with a Babcock clamp.  The tube was then followed out to the vimbria.  The Babcock clamp was then used to grasp the tube approximately 4 cm from the cornual region.  A 3 cm segment of tube was then ligated with a free tie of 0-Chromic using the [place] method and excised.  The left fallopian tube was then ligated in a similar fashion and excised. The tubal lumens were cauterized bilaterally.  Good hemostasis was noted with bilateral fallopian tubes.    The uterus was then returned to the abdomen. The pericolic gutters were cleared of all clots and debris. The peritoneum was reapproximated using 3-0 Vicryl in a running fashion, incorporating the muscle layer.  The fascia was then reapproximated with a running suture of  0-Vicryl. The subcutaneous fat layer was reapproximated with 2-0 Vicryl. The skin was reapproximated with 4-0 Monocryl. The skin and  subcutaneous tissues were then injected with an additional 40 ml of the Exparel solution. The incision was covered with steri-strips and a pressure dressing.   Instrument, sponge, and needle counts were correct prior the abdominal closure and at the conclusion of the case.    An experienced assistant was required given the standard of surgical care given the complexity of the case.  This assistant was needed for exposure, dissection, suctioning, retraction, instrument exchange, and for overall help during the procedure.  Estimated Blood Loss:  445 ml      Drains: foley catheter to gravity drainage, 75 ml of clear urine at end of the procedure         Total IV Fluids: 800 ml  Specimens: Segments of bilateral fallopian tubes         Implants: None         Complications:  None; patient tolerated the procedure well.         Disposition: PACU - hemodynamically stable.         Condition: stable   Hildred Laser, MD [facility]

## 2023-11-07 NOTE — Discharge Instructions (Signed)
Discharge Instructions:   If there are any new medications, they have been ordered and will be available for pickup at the listed pharmacy on your way home from the hospital.   Call office if you have any of the following: headache, visual changes, fever >101.0 F, chills, shortness of breath, breast concerns, excessive vaginal bleeding, incision drainage or problems, leg pain or redness, depression or any other concerns. If you have vaginal discharge with an odor, let your doctor know.   It is normal to bleed for up to 6 weeks. You should not soak through more than 1 pad in 1 hour. If you have a blood clot larger than your fist with continued bleeding, call your doctor.   After a c-section, you should expect a small amount of blood or clear fluid coming from the incision and abdominal cramping/soreness. Inspect your incision site daily. Stand in front of a mirror to look for any redness, incision opening, or discolored/odorness drainage. Take a shower daily and continue good hygiene. Use own towel and washcloth (do not share). Make sure your sheets on your bed are clean. No pets sleeping around your incision site. Dressing will be removed at your postpartum visit. If the dressing does become wet or soiled underneath, it is okay to remove it.   Activity: Do not lift > 10 lbs for 6 weeks (do not lift anything heavier than your baby). No intercourse, tampons, swimming pools, hot tubs, baths (only showers) for 6 weeks.  No driving for 1-2 weeks. Continue prenatal vitamin, especially if breastfeeding. Increase calories and fluids (water) while breastfeeding.   Your milk will come in, in the next couple of days (right now it is colostrum). You may have a slight fever when your milk comes in, but it should go away on its own.  If it does not, and rises above 101 F please call the doctor. You will also feel achy and your breasts will be firm. They will also start to leak. If you are breastfeeding, continue  as you have been and you can pump/express milk for comfort.   If you have too much milk, your breasts can become engorged, which could lead to mastitis. This is an infection of the milk ducts. It can be very painful and you will need to notify your doctor to obtain a prescription for antibiotics. You can also treat it with a shower or hot/cold compress.   For concerns about your baby, please call your pediatrician.  For breastfeeding concerns, the lactation consultant can be reached at [phone].   Postpartum blues (feelings of happy one minute and sad another minute) are normal for the first few weeks but if it gets worse let your doctor know.   Congratulations! We enjoyed caring for you and your new bundle of joy!

## 2023-11-07 NOTE — Progress Notes (Signed)
Per previous day shift RN, pt declined continuous pulse ox monitoring.

## 2023-11-07 NOTE — Anesthesia Procedure Notes (Signed)
Date/Time: 11/07/2023 7:55 AM  Performed by: Ginger Carne, CRNAPre-anesthesia Checklist: Patient identified, Emergency Drugs available, Suction available, Patient being monitored and Timeout performed Patient Re-evaluated:Patient Re-evaluated prior to induction Oxygen Delivery Method: Nasal cannula Preoxygenation: Pre-oxygenation with 100% oxygen

## 2023-11-07 NOTE — Anesthesia Procedure Notes (Signed)
Spinal  Patient location during procedure: OR Start time: 11/07/2023 7:50 AM End time: 11/07/2023 7:55 AM Reason for block: surgical anesthesia Staffing Performed: resident/CRNA  Anesthesiologist: Corinda Gubler, MD Resident/CRNA: Ginger Carne, CRNA Performed by: Ginger Carne, CRNA Authorized by: Corinda Gubler, MD   Preanesthetic Checklist Completed: patient identified, IV checked, site marked, risks and benefits discussed, surgical consent, monitors and equipment checked, pre-op evaluation and timeout performed Spinal Block Patient position: sitting Prep: ChloraPrep Patient monitoring: heart rate, continuous pulse ox, blood pressure and cardiac monitor Approach: midline Location: L3-4 Injection technique: single-shot Needle Needle type: Whitacre and Introducer  Needle gauge: 24 G Needle length: 9 cm Assessment Sensory level: T10 Events: CSF return Additional Notes Sterile aseptic technique used throughout the procedure.  Negative paresthesia. Negative blood return. Positive free-flowing CSF. Expiration date of kit checked and confirmed. Patient tolerated procedure well, without complications.

## 2023-11-07 NOTE — Anesthesia Preprocedure Evaluation (Addendum)
Anesthesia Evaluation  Patient identified by MRN, date of birth, ID band Patient awake  General Assessment Comment:  Secondary c/s and BTL. Prior c/s for twins done under spinal without issue  Reviewed: Allergy & Precautions, NPO status , Patient's Chart, lab work & pertinent test results  History of Anesthesia Complications Negative for: history of anesthetic complications  Airway Mallampati: II  TM Distance: >3 FB Neck ROM: Full    Dental  (+) Poor Dentition, Chipped, Missing Very poor dentition. None loose per patient:   Pulmonary neg sleep apnea, neg COPD, Patient abstained from smoking.Not current smoker, former smoker Childhood asthma, none as adult. Patient vapes   Pulmonary exam normal breath sounds clear to auscultation       Cardiovascular Exercise Tolerance: Good METS(-) hypertension(-) CAD and (-) Past MI (-) dysrhythmias  Rhythm:Regular Rate:Normal - Systolic murmurs    Neuro/Psych  Headaches PSYCHIATRIC DISORDERS Anxiety Depression       GI/Hepatic ,GERD  ,,(+)     (-) substance abuse    Endo/Other  neg diabetes    Renal/GU negative Renal ROS     Musculoskeletal   Abdominal   Peds  Hematology   Anesthesia Other Findings Past Medical History: 07/02/2021: Acute calculous cholecystitis No date: Anxiety No date: Asthma     Comment:  Childhood No date: Choledocholithiasis 11/26/2020: COVID-19 affecting pregnancy, antepartum No date: Depression 08/29/2020: Dichorionic diamniotic twin pregnancy in second trimester No date: GERD (gastroesophageal reflux disease)     Comment:  during pregnancy No date: Headache No date: History of preterm delivery     Comment:  DELIVERED 5 WKS EARLY 03/15/2017: History of preterm delivery, currently pregnant in second  trimester 11/24/2020: Twin pregnancy, twins dichorionic and diamniotic  Reproductive/Obstetrics (+) Pregnancy                              Anesthesia Physical Anesthesia Plan  ASA: 2  Anesthesia Plan: Spinal   Post-op Pain Management: Tylenol PO (pre-op)* and Gabapentin PO (pre-op)*   Induction:   PONV Risk Score and Plan: 4 or greater and Ondansetron and Dexamethasone  Airway Management Planned: Natural Airway  Additional Equipment:   Intra-op Plan:   Post-operative Plan:   Informed Consent: I have reviewed the patients History and Physical, chart, labs and discussed the procedure including the risks, benefits and alternatives for the proposed anesthesia with the patient or authorized representative who has indicated his/her understanding and acceptance.       Plan Discussed with: CRNA and Surgeon  Anesthesia Plan Comments: (Discussed R/B/A of neuraxial anesthesia technique with patient: - rare risks of spinal/epidural hematoma, nerve damage, infection - Risk of PDPH - Risk of itching - Risk of nausea and vomiting - Risk of conversion to general anesthesia and its associated risks, including sore throat, damage to lips/teeth/oropharynx, and rare risks such as cardiac and respiratory events. - Risk of surgical bleeding requiring blood products - Risk of allergic reactions Discussed the role of CRNA in patient's perioperative care.  Patient voiced understanding.)       Anesthesia Quick Evaluation

## 2023-11-07 NOTE — Lactation Note (Signed)
This note was copied from a baby's chart. Lactation Consultation Note  Patient Name: Lacey Bentley [id number] Date: 11/07/2023 Age:32 hours Reason for consult: Initial assessment;Term   Maternal Data Has patient been taught Hand Expression?:  (Patient stated that she knows how to hand express.  LC didnt physically teach it.) Does the patient have breastfeeding experience prior to this delivery?: Yes How long did the patient breastfeed?: 1 month for all children  Initial assessment w/ a P9 patient and a 8hr old baby Lacey.  This was a c-section delivery.  Patient has a hx of vaping.    Patient stated that her goal is to solely breastfeed but she always has a hard issue with left breast getting hard and milk not coming out.  Patient has a [employer] hands Akon Reinoso pump that she has with her in the hospital.   Feeding Mother's Current Feeding Choice: Breast Milk  No feeding observed.   Interventions Interventions: Breast feeding basics reviewed;Education  LC provided education on the following;  milk production expectations, hunger cues, day 1/2 wet/dirty diapers, hand expression, cluster feeding, benefits of STS and arousing infant for a feeding.  Lactation informed patient of feeding infant at least 8 or more times w/in a 24hr period but not exceeding 3hrs. Patient verbalized understanding.   LC discussed the difference between engorgement and clogged ducts.   Provided education on how to manage engorgement w/ ice, lymphatic drainage, and breast gymnastics.  Provided patient with spoon and small medicine cup for hand expression.  Discharge Pump: Personal;[facility] ([place] WIC Program: No  Consult Status Consult Status: Follow-up Date: 11/08/23 Follow-up type: In-patient    Yvette Rack Chiann Goffredo 11/07/2023, 4:48 PM

## 2023-11-07 NOTE — Transfer of Care (Signed)
Immediate Anesthesia Transfer of Care Note  Patient: Lacey Bentley. Bou[place]ard  Procedure(s) Performed: REPEAT CESAREAN SECTION WITH BTL REPEAT CESAREAN SECTION WITH BILATERAL TUBAL LIGATION  Patient Location: Labor & Delivery  Anesthesia Type:Spinal  Level of Consciousness: awake, alert , and oriented  Airway & Oxygen Therapy: Patient Spontanous Breathing  Post-op Assessment: Report given to RN and Post -op Vital signs reviewed and stable  Post vital signs: Reviewed and stable  Last Vitals:  Vitals Value Taken Time  BP 91/79 11/07/23 0927  Temp    Pulse 77 11/07/23 0927  Resp 11 11/07/23 0927  SpO2 97 % 11/07/23 0927    Last Pain:  Vitals:   11/07/23 0711  TempSrc: Oral  PainSc: 0-No pain         Complications: No notable events documented.

## 2023-11-07 NOTE — H&P (Signed)
Obstetric Preoperative History and Physical  Lacey Bentley is a 33 y.o. [id number] with IUP at [redacted]w[redacted]d presenting for presenting for scheduled repeat cesarean section with bilateral tubal ligation.  Has concerns about her previous incision, notes an area on the right lateral edge that is bothersome, and sometimes painful.   Prenatal Course Source of Care: [facility]  with onset of care at 9 weeks (total of 8 prenatal care visits). Pregnancy complications or risks: Patient Active Problem List   Diagnosis Date Noted   History of cesarean delivery affecting pregnancy 11/07/2023   Decreased fetal movement 10/27/2023   History of twin pregnancy in prior pregnancy 07/30/2023   Declines VBAC (vaginal birth after cesarean) trial 07/30/2023   Unwanted fertility 07/30/2023   Poor dentition 07/30/2023   Low-lying placenta 07/02/2023   History of cesarean delivery 06/30/2023   History of vacuum extraction assisted delivery 06/30/2023   Tobacco smoking affecting pregnancy in second trimester 06/30/2023   Grand multipara 08/29/2020   Supervision of high risk pregnancy, antepartum 03/15/2017   She plans to breastfeed She desires bilateral tubal ligation for postpartum contraception.   Prenatal labs and studies: ABO, Rh: --/--/AB POS (01/24 0525) Antibody: NEG (01/24 0525) Rubella: 3.33 (08/16 1048) RPR: Non Reactive (12/05 1033)  HBsAg: Negative (08/16 1048)  HIV: Non Reactive (08/16 1048)  MVH:QIONGEXB/-- (01/02 1047) 1 hr Glucola  patient did not complete. Had a HgbA1c in 3rd trimester, was 5.4 Genetic screening normal MaterniT21 Anatomy US normal   There is no immunization history for the selected administration types on file for this patient.   Past Medical History:  Diagnosis Date   Acute calculous cholecystitis 07/02/2021   Anxiety    Asthma    Childhood   Choledocholithiasis    COVID-19 affecting pregnancy, antepartum 11/26/2020   Depression    Dichorionic diamniotic twin  pregnancy in second trimester 08/29/2020   GERD (gastroesophageal reflux disease)    during pregnancy   Headache    History of preterm delivery    DELIVERED 5 WKS EARLY   History of preterm delivery, currently pregnant in second trimester 03/15/2017   Twin pregnancy, twins dichorionic and diamniotic 11/24/2020    Past Surgical History:  Procedure Laterality Date   CESAREAN SECTION MULTI-GESTATIONAL N/A 11/24/2020   Procedure: CESAREAN SECTION MULTI-GESTATIONAL;  Surgeon: Linzie Collin, MD;  Location: [facility] ORS;  Service: Obstetrics;  Laterality: N/A;   CHOLECYSTECTOMY N/A 07/03/2021   Procedure: LAPAROSCOPIC CHOLECYSTECTOMY;  Surgeon: Lucretia Roers, MD;  Location: [facility];  Service: General;  Laterality: N/A;   ENDOSCOPIC RETROGRADE CHOLANGIOPANCREATOGRAPHY (ERCP) WITH PROPOFOL N/A 01/15/2018   Procedure: ENDOSCOPIC RETROGRADE CHOLANGIOPANCREATOGRAPHY (ERCP) WITH PROPOFOL;  Surgeon: Midge Minium, MD;  Location: [facility] ENDOSCOPY;  Service: Endoscopy;  Laterality: N/A;    OB History  Gravida Para Term Preterm AB Living  12 7 5 2 4 8   SAB IAB Ectopic Multiple Live Births  1 3 0 1 8    # Outcome Date GA Lbr Len/2nd Weight Sex Type Anes PTL Lv  12 Current           11 IAB 04/12/22          10A Preterm 11/24/20 [redacted]w[redacted]d  2480 g F CS-LTranv Spinal  LIV     Birth Comments: q/o perineal fissure  10B Preterm 11/24/20 [redacted]w[redacted]d  2660 g F CS-LTranv Spinal  LIV     Birth Comments: q/o perineal fissure  9 Term 08/09/17 [redacted]w[redacted]d / 00:04 2820 g F Vag-Vacuum EPI  LIV  Birth Comments: no anomalies noted at delivery  8 Term 07/18/15 [redacted]w[redacted]d / 00:04 3650 g M Vag-Spont   LIV  7 SAB 2015 [redacted]w[redacted]d         6 Term 07/26/13 [redacted]w[redacted]d  3402 g F Vag-Spont   LIV  5 IAB 2014     TAB     4 Term 12/01/11 [redacted]w[redacted]d  3289 g F Vag-Spont   LIV  3 IAB 2012     TAB     2 Term 05/03/10 [redacted]w[redacted]d  3289 g F Vag-Spont   LIV  1 Preterm 03/23/09 [redacted]w[redacted]d  2523 g F Vag-Spont   LIV    Social History   Socioeconomic History   Marital  status: Married    Spouse name: Caryn Bee   Number of children: 8   Years of education: 10   Highest education level: Not on file  Occupational History   Occupation: server  Tobacco Use   Smoking status: Former    Current packs/day: 0.50    Average packs/day: 0.5 packs/day for 2.0 years (1.0 ttl pk-yrs)    Types: Cigarettes   Smokeless tobacco: Never  Vaping Use   Vaping status: Every Day   Devices: trying to quit  Substance and Sexual Activity   Alcohol use: Not Currently    Comment: socially   Drug use: Never   Sexual activity: Yes    Partners: Male    Birth control/protection: None  Other Topics Concern   Not on file  Social History Narrative   ** Merged History Encounter **       Social Drivers of Health   Financial Resource Strain: Low Risk  (04/09/2023)   Overall Financial Resource Strain (CARDIA)    Difficulty of Paying Living Expenses: Not very hard  Food Insecurity: No Food Insecurity (11/07/2023)   Hunger Vital Sign    Worried About Running Out of Food in the Last Year: Never true    Ran Out of Food in the Last Year: Never true  Transportation Needs: No Transportation Needs (11/07/2023)   PRAPARE - Administrator, Civil Service (Medical): No    Lack of Transportation (Non-Medical): No  Physical Activity: Sufficiently Active (04/09/2023)   Exercise Vital Sign    Days of Exercise per Week: 5 days    Minutes of Exercise per Session: 150+ min  Stress: Stress Concern Present (04/09/2023)   [employer] of Occupational Health - Occupational Stress Questionnaire    Feeling of Stress : Rather much  Social Connections: Moderately Isolated (04/09/2023)   Social Connection and Isolation Panel [NHANES]    Frequency of Communication with Friends and Family: More than three times a week    Frequency of Social Gatherings with Friends and Family: Three times a week    Attends Religious Services: Never    Active Member of Clubs or Organizations: No    Attends  Engineer, structural: Never    Marital Status: Married    Family History  Problem Relation Age of Onset   Other Mother        ovarian cyst   Other Father        deteriorating disc disease   Healthy Sister    Other Brother        throat closes up on him   Cancer Maternal Grandmother 60       lipid leukemia   Cancer Maternal Grandfather     Medications Prior to Admission  Medication Sig Dispense Refill Last Dose/Taking  acetaminophen (TYLENOL) 500 MG tablet Take 1,000 mg by mouth every 6 (six) hours as needed for moderate pain (pain score 4-6).   Taking As Needed   Prenatal Vit-Fe Fumarate-FA (MULTIVITAMIN-PRENATAL) 27-0.8 MG TABS tablet Take 1 tablet by mouth daily at 12 noon.   Taking    Allergies  Allergen Reactions   Aspirin Other (See Comments)    itching   Sunscreens Rash    Review of Systems: Negative except for what is mentioned in HPI.  Physical Exam: BP 109/72 (BP Location: Left Arm)   Pulse 82   Temp 98.5 F (36.9 C) (Oral)   Resp 16   Ht 5\' 2"  (1.575 m)   Wt 65.8 kg   LMP  (LMP Unknown)   BMI 26.53 kg/m  FHR by Doppler: 115 bpm GENERAL: Well-developed, well-nourished female in no acute distress.  LUNGS: Clear to auscultation bilaterally.  HEART: Regular rate and rhythm. ABDOMEN: Soft, nontender, nondistended, gravid, well-healed Pfannenstiel incision. PELVIC: Deferred EXTREMITIES: Nontender, no edema, 2+ distal pulses.   Pertinent Labs/Studies:   Results for orders placed or performed during the hospital encounter of 11/07/23 (from the past 72 hours)  CBC     Status: Abnormal   Collection Time: 11/07/23  5:15 AM  Result Value Ref Range   WBC 6.4 4.0 - 10.5 K/uL   RBC 3.50 (L) 3.87 - 5.11 MIL/uL   Hemoglobin 9.2 (L) 12.0 - 15.0 g/dL   HCT 16.1 (L) 09.6 - 04.5 %   MCV 82.9 80.0 - 100.0 fL   MCH 26.3 26.0 - 34.0 pg   MCHC 31.7 30.0 - 36.0 g/dL   RDW 40.9 81.1 - 91.4 %   Platelets 226 150 - 400 K/uL   nRBC 0.0 0.0 - 0.2 %    Comment:  Performed at [facility], [address] [place], [place] [address]  Type and screen     Status: None   Collection Time: 11/07/23  5:25 AM  Result Value Ref Range   ABO/RH(D) AB POS    Antibody Screen NEG    Sample Expiration      11/10/2023,2359 Performed at [facility], [address], [place], [place] [address]     Assessment and Plan :Lacey Bentley is a 33 y.o. [id number] at [redacted]w[redacted]d being admitted  for scheduled cesarean section delivery with bilateral tubal ligation. The risks of cesarean section were discussed with the patient including but were not limited to: bleeding which may require transfusion or reoperation; infection which may require antibiotics; injury to bowel, bladder, ureters or other surrounding organs; injury to the fetus; need for additional procedures including hysterectomy in the event of a life-threatening hemorrhage; formation of adhesions; placental abnormalities with subsequent pregnancies; incisional problems; thromboembolic phenomenon and other postoperative/anesthesia complications.  Patient also desires permanent sterilization.  Other reversible forms of contraception were discussed with patient; she declines all other modalities. This will be done either via bilateral salpingectomy or bilateral application of Filshie clips.  Risks of procedure discussed with patient including but not limited to: risk of regret, permanence of method, bleeding, infection, injury to surrounding organs and need for additional procedures.  Failure risk of about 1% with increased risk of ectopic gestation if pregnancy occurs was also discussed with patient.  Also discussed possibility of post-tubal syndrome with increased pelvic pain or menstrual irregularities. The patient concurred with the proposed plan, giving informed written consent for the procedures.  Patient has been NPO since midnight she will remain NPO for procedure.  Anesthesia and OR aware.  Preoperative  prophylactic antibiotics and SCDs ordered on call to the OR.  To OR when ready.   Hildred Laser, MD [facility]

## 2023-11-08 ENCOUNTER — Encounter: Payer: Self-pay | Admitting: Obstetrics and Gynecology

## 2023-11-08 DIAGNOSIS — D509 Iron deficiency anemia, unspecified: Secondary | ICD-10-CM | POA: Diagnosis present

## 2023-11-08 LAB — CBC
HCT: 21.6 % — ABNORMAL LOW (ref 36.0–46.0)
HCT: 22.5 % — ABNORMAL LOW (ref 36.0–46.0)
Hemoglobin: 7.1 g/dL — ABNORMAL LOW (ref 12.0–15.0)
Hemoglobin: 7.2 g/dL — ABNORMAL LOW (ref 12.0–15.0)
MCH: 26.5 pg (ref 26.0–34.0)
MCH: 26.6 pg (ref 26.0–34.0)
MCHC: 32.9 g/dL (ref 30.0–36.0)
MCHC: 32 g/dL (ref 30.0–36.0)
MCV: 80.6 fL (ref 80.0–100.0)
MCV: 83 fL (ref 80.0–100.0)
Platelets: 166 10*3/uL (ref 150–400)
Platelets: 191 10*3/uL (ref 150–400)
RBC: 2.68 MIL/uL — ABNORMAL LOW (ref 3.87–5.11)
RBC: 2.71 MIL/uL — ABNORMAL LOW (ref 3.87–5.11)
RDW: 14.5 % (ref 11.5–15.5)
RDW: 14.6 % (ref 11.5–15.5)
WBC: 6.8 10*3/uL (ref 4.0–10.5)
WBC: 7 10*3/uL (ref 4.0–10.5)
nRBC: 0 % (ref 0.0–0.2)
nRBC: 0 % (ref 0.0–0.2)

## 2023-11-08 NOTE — Lactation Note (Signed)
This note was copied from a baby's chart. Lactation Consultation Note  Patient Name: Girl Dalila Arca [id number] Date: 11/08/2023 Age:33 Reason for consult: Follow-up assessment;Term   Maternal Data Has patient been taught Hand Expression?: Yes Does the patient have breastfeeding experience prior to this delivery?: Yes How long did the patient breastfeed?: approx 1 mth This is mom's 9th child, last pregnancy was a twin pregnancy  Feeding Mother's Current Feeding Choice: Breast Milk and Formula Nipple Type: Slow - flow Mom states she has not breastfed today, recently formula fed baby, still plans on offering baby the breast later, offered assistance if needed.  LATCH Score Latch:  (I did not observe a feeding)                  Lactation Tools Discussed/Used    Interventions  LC name and no written on white board, encouraged mom to call for assistance with breastfeeding or any questions.  Discharge Pump: Personal WIC Program: Yes  Consult Status Consult Status: PRN Date: 11/08/23 Follow-up type: In-patient    Dyann Kief 11/08/2023, 1:46 PM

## 2023-11-08 NOTE — Plan of Care (Signed)
  Problem: Education: Goal: Knowledge of condition will improve Outcome: Progressing Goal: Individualized Educational Video(s) Outcome: Progressing Goal: Individualized Newborn Educational Video(s) Outcome: Progressing   Problem: Activity: Goal: Will verbalize the importance of balancing activity with adequate rest periods Outcome: Progressing Goal: Ability to tolerate increased activity will improve Outcome: Progressing   Problem: Coping: Goal: Ability to identify and utilize available resources and services will improve Outcome: Progressing   Problem: Life Cycle: Goal: Chance of risk for complications during the postpartum period will decrease Outcome: Progressing   Problem: Role Relationship: Goal: Ability to demonstrate positive interaction with newborn will improve Outcome: Progressing   Problem: Skin Integrity: Goal: Demonstration of wound healing without infection will improve Outcome: Progressing   Problem: Education: Goal: Knowledge of General Education information will improve Description: Including pain rating scale, medication(s)/side effects and non-pharmacologic comfort measures Outcome: Progressing   Problem: Health Behavior/Discharge Planning: Goal: Ability to manage health-related needs will improve Outcome: Progressing   Problem: Clinical Measurements: Goal: Ability to maintain clinical measurements within normal limits will improve Outcome: Progressing Goal: Will remain free from infection Outcome: Progressing Goal: Diagnostic test results will improve Outcome: Progressing Goal: Respiratory complications will improve Outcome: Progressing Goal: Cardiovascular complication will be avoided Outcome: Progressing   Problem: Activity: Goal: Risk for activity intolerance will decrease Outcome: Progressing   Problem: Nutrition: Goal: Adequate nutrition will be maintained Outcome: Progressing   Problem: Coping: Goal: Level of anxiety will  decrease Outcome: Progressing   Problem: Elimination: Goal: Will not experience complications related to bowel motility Outcome: Progressing Goal: Will not experience complications related to urinary retention Outcome: Progressing   Problem: Pain Managment: Goal: General experience of comfort will improve and/or be controlled Outcome: Progressing   Problem: Safety: Goal: Ability to remain free from injury will improve Outcome: Progressing   Problem: Skin Integrity: Goal: Risk for impaired skin integrity will decrease Outcome: Progressing   Problem: Education: Goal: Knowledge of the prescribed therapeutic regimen will improve Outcome: Progressing Goal: Understanding of sexual limitations or changes related to disease process or condition will improve Outcome: Progressing Goal: Individualized Educational Video(s) Outcome: Progressing   Problem: Self-Concept: Goal: Communication of feelings regarding changes in body function or appearance will improve Outcome: Progressing   Problem: Skin Integrity: Goal: Demonstration of wound healing without infection will improve Outcome: Progressing

## 2023-11-08 NOTE — Anesthesia Post-op Follow-up Note (Signed)
  Anesthesia Pain Follow-up Note  Patient: Lacey Bentley  Day #: 1  Date of Follow-up: 11/08/2023 Time: 9:41 AM  Last Vitals:  Vitals:   11/08/23 0015 11/08/23 0817  BP: 105/66 (!) 99/56  Pulse: 66 61  Resp: 20 18  Temp: 36.9 C 36.8 C  SpO2: 100% 100%    Level of Consciousness: alert  Pain: none   Side Effects:None  Catheter Site Exam:clean, dry, no drainage     Plan: D/C from anesthesia care at surgeon's request  [facility]

## 2023-11-08 NOTE — Progress Notes (Signed)
Progress Note - Cesarean Delivery  Lacey Bentley is a 33 y.o. [id number] now PP day 1 s/p C-Section, Low Transverse and bilateral tubal ligation  Subjective:  Patient reports no problems with eating, bowel movements, voiding, or their wound    Objective:  Vital signs in last 24 hours: Temp:  [97.4 F (36.3 C)-98.4 F (36.9 C)] 98.3 F (36.8 C) (01/25 0817) Pulse Rate:  [50-85] 61 (01/25 0817) Resp:  [4-24] 18 (01/25 0817) BP: (78-112)/(56-84) 99/56 (01/25 0817) SpO2:  [94 %-100 %] 100 % (01/25 0817)  Physical Exam:  General: alert, cooperative, appears stated age, fatigued, and no distress Lochia: appropriate, scant bleeding Uterine Fundus: firm@ u   Incision: healing well, no significant drainage, no significant erythema Bowel :   Bowel sounds present  Data Review Recent Labs    11/07/23 0515 11/08/23 0526  HGB 9.2* 7.1*  HCT 29.0* 21.6*    Assessment:  Principal Problem:   History of cesarean delivery affecting pregnancy   Status post Cesarean section. Doing well postoperatively.   Anemia, pt stable. Will repeat cbc this evening. Replace dressing with honey comb dressing today.   Plan:       Continue current care. Plan for discharge tomorrow .     Doreene Burke, CNM  11/08/2023 9:49 AM

## 2023-11-08 NOTE — Discharge Summary (Signed)
Postpartum Discharge Summary  Date of Service updated 11/08/2023     Patient Name: Lacey Bentley DOB: [birth year] MRN: [id number]  Date of admission: 11/07/2023 Delivery date:11/07/2023 Delivering provider: Hildred Laser Date of discharge: 11/09/2023  Admitting diagnosis: History of cesarean delivery affecting pregnancy [O34.219] Intrauterine pregnancy: [redacted]w[redacted]d     Secondary diagnosis:  Principal Problem:   History of cesarean delivery affecting pregnancy Active Problems:   Supervision of high risk pregnancy, antepartum   Grand multipara   Declines VBAC (vaginal birth after cesarean) trial   Unwanted fertility   Iron deficiency anemia of pregnancy  Additional problems: Anemia of pregnancy (iron deficiency)    Discharge diagnosis: Term Pregnancy Delivered and Anemia                                              Post partum procedures:postpartum tubal ligation Augmentation: N/A Complications: None  Hospital course: Sceduled C/S   33 y.o. yo [id number] at [redacted]w[redacted]d was admitted to the hospital 11/07/2023 for scheduled cesarean section with the following indication:Elective Repeat.Delivery details are as follows:  Membrane Rupture Time/Date: 8:34 AM,11/07/2023  Delivery Method:C-Section, Low Transverse Operative Delivery:N/A Details of operation can be found in separate operative note.  Patient had an uncomplicated postpartum course.  She is ambulating, tolerating a regular diet, passing flatus & has had a BM, and urinating well. Patient is discharged home in stable condition on  11/09/23        Newborn Data: Birth date:11/07/2023 Birth time:8:34 AM Gender:Female Living status:Living Apgars:8 ,9  Weight:3520 g    Magnesium Sulfate received: No BMZ received: No Rhophylac:No MMR:No T-DaP: Declined Flu: Declined RSV Vaccine received: Declined Transfusion:No Immunizations administered: There is no immunization history for the selected administration types on file for this  patient.  Physical exam  Vitals:   11/08/23 0817 11/08/23 1610 11/09/23 0138 11/09/23 0945  BP: (!) 99/56 99/68 98/74  109/71  Pulse: 61  (!) 58 63  Resp: 18 18 20 18   Temp: 98.3 F (36.8 C) 98.7 F (37.1 C) 98.2 F (36.8 C) 98.2 F (36.8 C)  TempSrc: Oral Oral Oral Oral  SpO2: 100% 100% 100% 100%  Weight:      Height:       General: alert, cooperative, and no distress Lochia: appropriate Uterine Fundus: firm Incision: Healing well with no significant drainage, No significant erythema DVT Evaluation: No evidence of DVT seen on physical exam. Negative Homan's sign. No cords or calf tenderness. No significant calf/ankle edema. Labs: Lab Results  Component Value Date   WBC 7.0 11/08/2023   HGB 7.2 (L) 11/08/2023   HCT 22.5 (L) 11/08/2023   MCV 83.0 11/08/2023   PLT 191 11/08/2023      Latest Ref Rng & Units 07/04/2021    4:58 AM  CMP  Glucose 70 - 99 mg/dL 96   BUN 6 - 20 mg/dL 5   Creatinine 4.01 - 0.27 mg/dL 2.53   Sodium 664 - 403 mmol/L 136   Potassium 3.5 - 5.1 mmol/L 3.8   Chloride 98 - 111 mmol/L 106   CO2 22 - 32 mmol/L 24   Calcium 8.9 - 10.3 mg/dL 8.6   Total Protein 6.5 - 8.1 g/dL 6.1   Total Bilirubin 0.3 - 1.2 mg/dL 1.0   Alkaline Phos 38 - 126 U/L 67   AST 15 - 41 U/L 137  ALT 0 - 44 U/L 244    Edinburgh Score:    11/08/2023   10:05 PM  Edinburgh Postnatal Depression Scale Screening Tool  I have been able to laugh and see the funny side of things. 0  I have looked forward with enjoyment to things. 0  I have blamed myself unnecessarily when things went wrong. 2  I have been anxious or worried for no good reason. 1  I have felt scared or panicky for no good reason. 1  Things have been getting on top of me. 1  I have been so unhappy that I have had difficulty sleeping. 0  I have felt sad or miserable. 0  I have been so unhappy that I have been crying. 0  The thought of harming myself has occurred to me. 0  Edinburgh Postnatal Depression Scale  Total 5      After visit meds:  Allergies as of 11/09/2023       Reactions   Aspirin Other (See Comments)   itching   Sunscreens Rash        Medication List     TAKE these medications    acetaminophen 500 MG tablet Commonly known as: TYLENOL Take 2 tablets (1,000 mg total) by mouth every 8 (eight) hours as needed. What changed:  when to take this reasons to take this   coconut oil Oil Apply 1 Application topically as needed.   dibucaine 1 % Oint Commonly known as: NUPERCAINAL Place 1 Application rectally as needed for hemorrhoids.   ibuprofen 600 MG tablet Commonly known as: ADVIL Take 1 tablet (600 mg total) by mouth every 6 (six) hours.   lidocaine 5 % Commonly known as: LIDODERM Place 1 patch onto the skin daily. Remove & Discard patch within 12 hours or as directed by MD Start taking on: November 10, 2023   multivitamin-prenatal 27-0.8 MG Tabs tablet Take 1 tablet by mouth daily at 12 noon.   oxyCODONE 5 MG immediate release tablet Commonly known as: Roxicodone Take 1 tablet (5 mg total) by mouth every 4 (four) hours as needed for up to 5 days for severe pain (pain score 7-10).   senna-docusate 8.6-50 MG tablet Commonly known as: Senokot-S Take 2 tablets by mouth daily. Start taking on: November 10, 2023   simethicone 80 MG chewable tablet Commonly known as: MYLICON Chew 1 tablet (80 mg total) by mouth as needed for flatulence.   witch hazel-glycerin pad Commonly known as: TUCKS Apply 1 Application topically as needed for hemorrhoids.               Discharge Care Instructions  (From admission, onward)           Start     Ordered   11/09/23 0000  Leave dressing on - Keep it clean, dry, and intact until clinic visit        11/09/23 1030             Discharge home in stable condition Infant Feeding: Bottle and Breast Infant Disposition:home with mother Discharge instruction: per After Visit Summary and Postpartum  booklet. Activity: Advance as tolerated. Pelvic rest for 6 weeks.  Diet: routine diet Anticipated Birth Control: BTL done PP Postpartum Appointment:6 weeks Additional Postpartum F/U: Incision check 1 week Future Appointments: Future Appointments  Date Time Provider Department Center  11/18/2023  8:55 AM Hildred Laser, MD [facility] None   Follow up Visit:  Follow-up Information     Hildred Laser, MD. Schedule an appointment  as soon as possible for a visit in 1 week(s).   Specialties: Obstetrics and Gynecology, Radiology Why: Call and schedule an appointment in 1-week for an incision check with Dr. Valentino Saxon at [facility] Contact information: [address] [place] [address] [phone]                     11/09/2023 Dominica Severin, CNM

## 2023-11-08 NOTE — Anesthesia Postprocedure Evaluation (Signed)
Anesthesia Post Note  Patient: Lacey Bentley. [place]  Procedure(s) Performed: REPEAT CESAREAN SECTION WITH BTL REPEAT CESAREAN SECTION WITH BILATERAL TUBAL LIGATION  Patient location during evaluation: Mother Baby Anesthesia Type: Spinal Level of consciousness: awake and alert Pain management: pain level controlled Vital Signs Assessment: post-procedure vital signs reviewed and stable Respiratory status: spontaneous breathing, nonlabored ventilation, respiratory function stable and patient connected to nasal cannula oxygen Cardiovascular status: blood pressure returned to baseline and stable Postop Assessment: no apparent nausea or vomiting Anesthetic complications: no  No notable events documented.   Last Vitals:  Vitals:   11/08/23 0015 11/08/23 0817  BP: 105/66 (!) 99/56  Pulse: 66 61  Resp: 20 18  Temp: 36.9 C 36.8 C  SpO2: 100% 100%    Last Pain:  Vitals:   11/08/23 0817  TempSrc: Oral  PainSc:                  Stephanie Coup

## 2023-11-09 MED ORDER — IBUPROFEN 600 MG PO TABS: 600.0000 mg | ORAL_TABLET | Freq: Four times a day (QID) | ORAL | 1 refills | Status: AC

## 2023-11-09 MED ORDER — SENNOSIDES-DOCUSATE SODIUM 8.6-50 MG PO TABS: 2.0000 | ORAL_TABLET | Freq: Every day | ORAL | 0 refills | Status: AC

## 2023-11-09 MED ORDER — COCONUT OIL OIL: 1.0000 | TOPICAL_OIL | Status: AC | PRN

## 2023-11-09 MED ORDER — OXYCODONE HCL 5 MG PO TABS: 5.0000 mg | ORAL_TABLET | ORAL | 0 refills | Status: AC | PRN

## 2023-11-09 MED ORDER — OXYCODONE HCL 5 MG PO TABS
10.0000 mg | ORAL_TABLET | ORAL | Status: DC | PRN
  Administered 2023-11-09: 10 mg via ORAL
  Filled 2023-11-09 (×2): qty 2

## 2023-11-09 MED ORDER — DIBUCAINE (PERIANAL) 1 % EX OINT: 1.0000 | TOPICAL_OINTMENT | CUTANEOUS | Status: AC | PRN

## 2023-11-09 MED ORDER — ACETAMINOPHEN 500 MG PO TABS: 1000.0000 mg | ORAL_TABLET | Freq: Three times a day (TID) | ORAL | 1 refills | Status: AC | PRN

## 2023-11-09 MED ORDER — WITCH HAZEL-GLYCERIN EX PADS: 1.0000 | MEDICATED_PAD | CUTANEOUS | 12 refills | Status: AC | PRN

## 2023-11-09 MED ORDER — SIMETHICONE 80 MG PO CHEW: 80.0000 mg | CHEWABLE_TABLET | ORAL | 0 refills | Status: AC | PRN

## 2023-11-09 MED ORDER — LIDOCAINE 5 % EX PTCH: 1.0000 | MEDICATED_PATCH | CUTANEOUS | 0 refills | Status: AC

## 2023-11-09 NOTE — Progress Notes (Signed)
Discharge instructions reviewed with patient and significant other.  Questions answered and follow up care reviewed.  Printed copies given to patient for reference after discharge home.

## 2023-11-10 LAB — SURGICAL PATHOLOGY

## 2023-11-14 NOTE — Progress Notes (Signed)
 OBSTETRICS/GYNECOLOGY POST-OPERATIVE CLINIC VISIT  Subjective:     Lacey Bentley is a 33 y.o. female who presents to the clinic 1.5 weeks status post REPEAT CESAREAN SECTION WITH BILATERAL TUBAL LIGATION  for History of previous C-section, Undesired fertility . Eating a regular diet without difficulty. Bowel movements are normal. The patient is not having any pain.  Postpartum course is well so far.  Baby is doing well, breastfeeding is going fairly well.  Mood is well, denies symptoms of postpartum blues or depression. Desires to return to work as soon as possible (prior to 6 weeks).   The following portions of the patient's history were reviewed and updated as appropriate: allergies, current medications, past family history, past medical history, past social history, past surgical history, and problem list.  Review of Systems Pertinent items noted in HPI and remainder of comprehensive ROS otherwise negative.   Objective:   BP (!) 119/95   Pulse 75   Resp 16   Ht 5' 2 (1.575 m)   Wt 136 lb (61.7 kg)   BMI 24.87 kg/m  Body mass index is 24.87 kg/m.  General:  alert and no distress  Abdomen: soft, bowel sounds active, non-tender  Incision:   healing well, no drainage, no erythema, no hernia, no seroma, no swelling, no dehiscence, incision well approximated  Psych: Normal mood, affect, speech.       11/18/2023    9:08 AM 11/08/2023   10:05 PM 11/07/2023    8:50 PM 04/09/2023   11:42 AM 11/25/2020    7:00 PM  Edinburgh Postnatal Depression Scale Screening Tool  I have been able to laugh and see the funny side of things. 0 0 -- --   I have looked forward with enjoyment to things. 0 0  2   I have blamed myself unnecessarily when things went wrong. 0 2  3   I have been anxious or worried for no good reason. 0 1  3   I have felt scared or panicky for no good reason. 0 1  3   Things have been getting on top of me. 0 1  2   I have been so unhappy that I have had difficulty sleeping.  0 0  0   I have felt sad or miserable. 0 0  0   I have been so unhappy that I have been crying. 0 0  0   The thought of harming myself has occurred to me. 0 0  0   Edinburgh Postnatal Depression Scale Total 0 5        Information is confidential and restricted. Go to Review Flowsheets to unlock data.     Pathology:    1. Fallopian tube, left, segment :       - SEGMENT OF FALLOPIAN TUBE WITH FULL CROSS-SECTION OF THE LUMEN EXAMINED.        2. Fallopian tube, right, segment :       - SEGMENT OF FALLOPIAN TUBE WITH FULL CROSS-SECTION OF THE LUMEN EXAMINED.   Assessment:   Patient s/p REPEAT CESAREAN SECTION WITH BILATERAL TUBAL LIGATION. Doing well postoperatively. 2    Lactating mother.  3.   Screen for postpartum depression  Plan:   1. Continue any current medications as instructed by provider. 2. Wound care discussed. 3. Operative findings again reviewed. Pathology report discussed. 4. Activity restrictions: no bending, stooping, or squatting, no lifting more than 15 pounds, and no overhead lifting 5. Anticipated return to work:  1-2 weeks. Work notice given for light duty until 6 week visit. 6. Follow up: 4 weeks for final postpartum check    Archie Savers, MD [facility]

## 2023-11-14 NOTE — Patient Instructions (Signed)

## 2023-11-18 ENCOUNTER — Encounter: Payer: Self-pay | Admitting: Obstetrics and Gynecology

## 2023-11-18 ENCOUNTER — Ambulatory Visit (INDEPENDENT_AMBULATORY_CARE_PROVIDER_SITE_OTHER): Payer: Medicaid Other | Admitting: Obstetrics and Gynecology

## 2023-11-18 VITALS — BP 119/95 | HR 75 | Resp 16 | Ht 62.0 in | Wt 136.0 lb

## 2023-11-18 DIAGNOSIS — Z4889 Encounter for other specified surgical aftercare: Secondary | ICD-10-CM

## 2023-11-18 DIAGNOSIS — Z1332 Encounter for screening for maternal depression: Secondary | ICD-10-CM

## 2023-11-24 ENCOUNTER — Telehealth: Payer: Self-pay

## 2023-11-24 NOTE — Telephone Encounter (Signed)
 Patient called triage stating she had a c-section about 2 weeks ago. She passed blood clots in the last 4-5 days. She's stating the discharge/ blood flow has a purplish tint, no odor, no fever, no dizziness, she states she feels fine.  Everyone around her is concerned.  Please advise.

## 2023-12-15 NOTE — Patient Instructions (Signed)

## 2023-12-15 NOTE — Progress Notes (Unsigned)
   OBSTETRICS POSTPARTUM CLINIC PROGRESS NOTE  Subjective:     Lacey Bentley is a 33 y.o. [id number] female who presents for a postpartum visit. She is 6 weeks postpartum following a repeat low cervical transverse Cesarean section with Bilateral Tubal Ligation. I have fully reviewed the prenatal and intrapartum course. The delivery was at 39.4 gestational weeks.  Anesthesia: spinal. Postpartum course has been going well. Baby's course has been good. Baby is feeding by bottle - [employer] . Bleeding: patient has resumed menses, with No LMP recorded.. Bowel function is normal. Bladder function is normal. Patient is sexually active. Contraception method desired is tubal ligation. Postpartum depression screening: negative.  EDPS score is 2.    The following portions of the patient's history were reviewed and updated as appropriate: allergies, current medications, past family history, past medical history, past social history, past surgical history, and problem list.  Review of Systems Pertinent items noted in HPI and remainder of comprehensive ROS otherwise negative.   Objective:    BP 102/79   Pulse 76   Ht 5\' 2"  (1.575 m)   Wt 133 lb 12.8 oz (60.7 kg)   LMP 11/07/2023   Breastfeeding No   BMI 24.47 kg/m   General:  alert and no distress   Breasts:  inspection negative, no nipple discharge or bleeding  Lungs: clear to auscultation bilaterally  Heart:  regular rate and rhythm, S1, S2 normal, no murmur, click, rub or gallop  Abdomen: soft, non-tender; bowel sounds normal; no masses,  no organomegaly.  Well healed Pfannenstiel incision   Vulva:  normal  Vagina: normal vagina, scant thin brown discharge.   Cervix:  no cervical motion tenderness and no lesions  Uterus:: Bimanual exam not performed  Adnexa:  Bimanual exam not performed.   Rectal Exam: Not performed.         Labs:     Latest Ref Rng & Units 12/16/2023    8:51 AM 11/08/2023    5:07 PM 11/08/2023    5:26 AM  CBC  WBC 4.0  - 10.5 K/uL  7.0  6.8   Hemoglobin 11 - 14.6 g/dL 9.4  7.2  7.1   Hematocrit 36.0 - 46.0 %  22.5  21.6   Platelets 150 - 400 K/uL  191  166       Assessment:   1. Postpartum care following cesarean delivery   2. Iron deficiency anemia of pregnancy   3. Cervical cancer screening      Plan:   1. Contraception: tubal ligation performed at time of delivery 2. Check Hgb for h/o postpartum anemia of less than 10, currently  improved at 9.4, recommend taking a daily iron supplement for 1 month (has not been taking thus far).  3. Pap smear performed today as cervical screening is overdue.  4. Follow up in:  3-4  months or as needed.    Hildred Laser, MD [facility]

## 2023-12-16 ENCOUNTER — Ambulatory Visit (INDEPENDENT_AMBULATORY_CARE_PROVIDER_SITE_OTHER): Payer: Medicaid Other | Admitting: Obstetrics and Gynecology

## 2023-12-16 ENCOUNTER — Other Ambulatory Visit (HOSPITAL_COMMUNITY)
Admission: RE | Admit: 2023-12-16 | Discharge: 2023-12-16 | Disposition: A | Discharge status: Home / Self Care | Source: Ambulatory Visit | Attending: Obstetrics and Gynecology | Admitting: Obstetrics and Gynecology

## 2023-12-16 ENCOUNTER — Encounter: Payer: Self-pay | Admitting: Obstetrics and Gynecology

## 2023-12-16 DIAGNOSIS — Z124 Encounter for screening for malignant neoplasm of cervix: Secondary | ICD-10-CM | POA: Insufficient documentation

## 2023-12-16 DIAGNOSIS — D509 Iron deficiency anemia, unspecified: Secondary | ICD-10-CM

## 2023-12-16 LAB — POCT HEMOGLOBIN: Hemoglobin: 9.4 g/dL — AB (ref 11–14.6)

## 2023-12-22 ENCOUNTER — Encounter: Payer: Self-pay | Admitting: Obstetrics and Gynecology

## 2023-12-23 ENCOUNTER — Encounter: Payer: Self-pay | Admitting: Obstetrics and Gynecology

## 2023-12-23 LAB — CYTOLOGY - PAP
Comment: NEGATIVE
Comment: NEGATIVE
Comment: NEGATIVE
HPV 16: NEGATIVE
HPV 18 / 45: NEGATIVE
High risk HPV: POSITIVE — AB

## 2024-04-26 NOTE — Telephone Encounter (Signed)
 error
